# Patient Record
Sex: Female | Born: 1960 | Race: Black or African American | Hispanic: No | Smoking: Never smoker
Health system: Southern US, Community
[De-identification: ages and names within clinical notes are randomized; demographics above are authoritative.]

## PROBLEM LIST (undated history)

## (undated) DIAGNOSIS — I10 Essential (primary) hypertension: Secondary | ICD-10-CM

## (undated) DIAGNOSIS — D649 Anemia, unspecified: Secondary | ICD-10-CM

## (undated) DIAGNOSIS — K635 Polyp of colon: Secondary | ICD-10-CM

## (undated) DIAGNOSIS — G4730 Sleep apnea, unspecified: Secondary | ICD-10-CM

## (undated) DIAGNOSIS — IMO0002 Reserved for concepts with insufficient information to code with codable children: Secondary | ICD-10-CM

## (undated) DIAGNOSIS — A048 Other specified bacterial intestinal infections: Secondary | ICD-10-CM

## (undated) DIAGNOSIS — E66813 Obesity, class 3: Secondary | ICD-10-CM

## (undated) DIAGNOSIS — D509 Iron deficiency anemia, unspecified: Secondary | ICD-10-CM

## (undated) DIAGNOSIS — E559 Vitamin D deficiency, unspecified: Secondary | ICD-10-CM

## (undated) DIAGNOSIS — K219 Gastro-esophageal reflux disease without esophagitis: Secondary | ICD-10-CM

## (undated) DIAGNOSIS — F32A Depression, unspecified: Secondary | ICD-10-CM

## (undated) DIAGNOSIS — B009 Herpesviral infection, unspecified: Secondary | ICD-10-CM

## (undated) DIAGNOSIS — Z9989 Dependence on other enabling machines and devices: Secondary | ICD-10-CM

## (undated) DIAGNOSIS — K644 Residual hemorrhoidal skin tags: Secondary | ICD-10-CM

## (undated) DIAGNOSIS — M069 Rheumatoid arthritis, unspecified: Secondary | ICD-10-CM

## (undated) DIAGNOSIS — M1990 Unspecified osteoarthritis, unspecified site: Secondary | ICD-10-CM

## (undated) DIAGNOSIS — G4733 Obstructive sleep apnea (adult) (pediatric): Secondary | ICD-10-CM

## (undated) DIAGNOSIS — F329 Major depressive disorder, single episode, unspecified: Secondary | ICD-10-CM

## (undated) DIAGNOSIS — E785 Hyperlipidemia, unspecified: Secondary | ICD-10-CM

## (undated) DIAGNOSIS — T7840XA Allergy, unspecified, initial encounter: Secondary | ICD-10-CM

## (undated) DIAGNOSIS — K449 Diaphragmatic hernia without obstruction or gangrene: Secondary | ICD-10-CM

## (undated) DIAGNOSIS — K649 Unspecified hemorrhoids: Secondary | ICD-10-CM

## (undated) HISTORY — DX: Obstructive sleep apnea (adult) (pediatric): G47.33

## (undated) HISTORY — DX: Diaphragmatic hernia without obstruction or gangrene: K44.9

## (undated) HISTORY — DX: Reserved for concepts with insufficient information to code with codable children: IMO0002

## (undated) HISTORY — DX: Anemia, unspecified: D64.9

## (undated) HISTORY — DX: Iron deficiency anemia, unspecified: D50.9

## (undated) HISTORY — DX: Sleep apnea, unspecified: G47.30

## (undated) HISTORY — DX: Rheumatoid arthritis, unspecified: M06.9

## (undated) HISTORY — DX: Unspecified osteoarthritis, unspecified site: M19.90

## (undated) HISTORY — DX: Major depressive disorder, single episode, unspecified: F32.9

## (undated) HISTORY — DX: Unspecified hemorrhoids: K64.9

## (undated) HISTORY — DX: Morbid (severe) obesity due to excess calories: E66.01

## (undated) HISTORY — DX: Depression, unspecified: F32.A

## (undated) HISTORY — DX: Essential (primary) hypertension: I10

## (undated) HISTORY — DX: Vitamin D deficiency, unspecified: E55.9

## (undated) HISTORY — DX: Dependence on other enabling machines and devices: Z99.89

## (undated) HISTORY — DX: Herpesviral infection, unspecified: B00.9

## (undated) HISTORY — DX: Residual hemorrhoidal skin tags: K64.4

## (undated) HISTORY — DX: Polyp of colon: K63.5

## (undated) HISTORY — DX: Hyperlipidemia, unspecified: E78.5

## (undated) HISTORY — DX: Gastro-esophageal reflux disease without esophagitis: K21.9

## (undated) HISTORY — DX: Allergy, unspecified, initial encounter: T78.40XA

## (undated) HISTORY — DX: Other specified bacterial intestinal infections: A04.8

## (undated) HISTORY — DX: Obesity, class 3: E66.813

## (undated) HISTORY — PX: TUBAL LIGATION: SHX77

---

## 1999-08-26 ENCOUNTER — Other Ambulatory Visit: Admission: RE | Admit: 1999-08-26 | Discharge: 1999-08-26 | Payer: Self-pay | Admitting: Family Medicine

## 2003-05-12 ENCOUNTER — Emergency Department (HOSPITAL_COMMUNITY): Admission: EM | Admit: 2003-05-12 | Discharge: 2003-05-12 | Payer: Self-pay | Admitting: Emergency Medicine

## 2003-06-15 ENCOUNTER — Encounter: Admission: RE | Admit: 2003-06-15 | Discharge: 2003-06-15 | Payer: Self-pay | Admitting: Internal Medicine

## 2003-09-04 ENCOUNTER — Emergency Department (HOSPITAL_COMMUNITY): Admission: EM | Admit: 2003-09-04 | Discharge: 2003-09-04 | Payer: Self-pay | Admitting: Family Medicine

## 2004-07-11 ENCOUNTER — Emergency Department (HOSPITAL_COMMUNITY): Admission: EM | Admit: 2004-07-11 | Discharge: 2004-07-11 | Payer: Self-pay | Admitting: Family Medicine

## 2004-07-14 ENCOUNTER — Emergency Department (HOSPITAL_COMMUNITY): Admission: EM | Admit: 2004-07-14 | Discharge: 2004-07-14 | Payer: Self-pay | Admitting: Family Medicine

## 2004-07-18 ENCOUNTER — Emergency Department (HOSPITAL_COMMUNITY): Admission: EM | Admit: 2004-07-18 | Discharge: 2004-07-18 | Payer: Self-pay | Admitting: Family Medicine

## 2005-12-05 ENCOUNTER — Emergency Department (HOSPITAL_COMMUNITY): Admission: EM | Admit: 2005-12-05 | Discharge: 2005-12-06 | Payer: Self-pay | Admitting: Family Medicine

## 2006-11-14 ENCOUNTER — Ambulatory Visit (HOSPITAL_COMMUNITY): Admission: RE | Admit: 2006-11-14 | Discharge: 2006-11-14 | Payer: Self-pay | Admitting: Internal Medicine

## 2011-05-25 ENCOUNTER — Ambulatory Visit (INDEPENDENT_AMBULATORY_CARE_PROVIDER_SITE_OTHER): Payer: BC Managed Care – PPO | Admitting: Physician Assistant

## 2011-05-25 DIAGNOSIS — N920 Excessive and frequent menstruation with regular cycle: Secondary | ICD-10-CM

## 2011-05-25 DIAGNOSIS — Z23 Encounter for immunization: Secondary | ICD-10-CM

## 2011-05-25 DIAGNOSIS — E782 Mixed hyperlipidemia: Secondary | ICD-10-CM

## 2011-05-25 DIAGNOSIS — D509 Iron deficiency anemia, unspecified: Secondary | ICD-10-CM

## 2011-07-05 ENCOUNTER — Encounter: Payer: Self-pay | Admitting: Physician Assistant

## 2011-07-05 DIAGNOSIS — Z6836 Body mass index (BMI) 36.0-36.9, adult: Secondary | ICD-10-CM | POA: Insufficient documentation

## 2011-07-05 DIAGNOSIS — I10 Essential (primary) hypertension: Secondary | ICD-10-CM | POA: Insufficient documentation

## 2011-07-05 DIAGNOSIS — G4733 Obstructive sleep apnea (adult) (pediatric): Secondary | ICD-10-CM | POA: Insufficient documentation

## 2011-07-05 DIAGNOSIS — K219 Gastro-esophageal reflux disease without esophagitis: Secondary | ICD-10-CM | POA: Insufficient documentation

## 2011-07-05 DIAGNOSIS — B009 Herpesviral infection, unspecified: Secondary | ICD-10-CM | POA: Insufficient documentation

## 2011-07-05 DIAGNOSIS — E559 Vitamin D deficiency, unspecified: Secondary | ICD-10-CM | POA: Insufficient documentation

## 2011-07-05 DIAGNOSIS — D509 Iron deficiency anemia, unspecified: Secondary | ICD-10-CM | POA: Insufficient documentation

## 2011-07-05 DIAGNOSIS — E1159 Type 2 diabetes mellitus with other circulatory complications: Secondary | ICD-10-CM | POA: Insufficient documentation

## 2011-07-21 ENCOUNTER — Other Ambulatory Visit: Payer: Self-pay | Admitting: Physician Assistant

## 2011-09-15 ENCOUNTER — Ambulatory Visit: Payer: BC Managed Care – PPO

## 2011-09-18 ENCOUNTER — Ambulatory Visit (INDEPENDENT_AMBULATORY_CARE_PROVIDER_SITE_OTHER): Payer: BC Managed Care – PPO | Admitting: Family Medicine

## 2011-09-18 VITALS — BP 145/82 | HR 60 | Temp 98.3°F | Resp 18 | Ht 66.5 in | Wt 268.0 lb

## 2011-09-18 DIAGNOSIS — J40 Bronchitis, not specified as acute or chronic: Secondary | ICD-10-CM

## 2011-09-18 DIAGNOSIS — J069 Acute upper respiratory infection, unspecified: Secondary | ICD-10-CM

## 2011-09-18 MED ORDER — HYDROCODONE-HOMATROPINE 5-1.5 MG/5ML PO SYRP: 5.0000 mL | ORAL_SOLUTION | Freq: Three times a day (TID) | ORAL | Status: AC | PRN

## 2011-09-18 MED ORDER — AZITHROMYCIN 250 MG PO TABS: ORAL_TABLET | ORAL | Status: AC

## 2011-09-18 NOTE — Patient Instructions (Signed)

## 2011-09-18 NOTE — Progress Notes (Signed)
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 She's had one week of progressive cough occasionally productive and associated with intermittent low-grade fever. She's had no shortness of breath, but she has noted sided rib pain with cough. He's had mild sinus congestion, no ear pain or sore throat  Objective: Middle-age woman overweight, in no acute distress  HEENT: Unremarkable  Chest: A few rhonchi  Heart: Regular no murmur or rub extremities: No edema  Assessment: URI, bronchitis  Plan: Z-Pak and Hydromet

## 2011-10-24 ENCOUNTER — Ambulatory Visit (INDEPENDENT_AMBULATORY_CARE_PROVIDER_SITE_OTHER): Payer: BC Managed Care – PPO | Admitting: Physician Assistant

## 2011-10-24 VITALS — BP 142/78 | HR 64 | Temp 98.2°F | Resp 20 | Ht 65.5 in | Wt 259.2 lb

## 2011-10-24 DIAGNOSIS — D649 Anemia, unspecified: Secondary | ICD-10-CM

## 2011-10-24 DIAGNOSIS — R5383 Other fatigue: Secondary | ICD-10-CM

## 2011-10-24 DIAGNOSIS — L83 Acanthosis nigricans: Secondary | ICD-10-CM

## 2011-10-24 DIAGNOSIS — F329 Major depressive disorder, single episode, unspecified: Secondary | ICD-10-CM

## 2011-10-24 DIAGNOSIS — R5381 Other malaise: Secondary | ICD-10-CM

## 2011-10-24 LAB — COMPREHENSIVE METABOLIC PANEL
ALT: 15 U/L (ref 0–35)
AST: 17 U/L (ref 0–37)
Albumin: 4.1 g/dL (ref 3.5–5.2)
Alkaline Phosphatase: 64 U/L (ref 39–117)
BUN: 11 mg/dL (ref 6–23)
CO2: 27 mEq/L (ref 19–32)
Calcium: 9.2 mg/dL (ref 8.4–10.5)
Chloride: 104 mEq/L (ref 96–112)
Creat: 0.67 mg/dL (ref 0.50–1.10)
Glucose, Bld: 76 mg/dL (ref 70–99)
Potassium: 4.7 mEq/L (ref 3.5–5.3)
Sodium: 139 mEq/L (ref 135–145)
Total Bilirubin: 0.4 mg/dL (ref 0.3–1.2)
Total Protein: 7.9 g/dL (ref 6.0–8.3)

## 2011-10-24 LAB — POCT CBC
Granulocyte percent: 52 %G (ref 37–80)
HCT, POC: 36.5 % — AB (ref 37.7–47.9)
Hemoglobin: 11 g/dL — AB (ref 12.2–16.2)
Lymph, poc: 1.9 (ref 0.6–3.4)
MCH, POC: 24.2 pg — AB (ref 27–31.2)
MCHC: 30.1 g/dL — AB (ref 31.8–35.4)
MCV: 80.5 fL (ref 80–97)
MID (cbc): 0.3 (ref 0–0.9)
MPV: 7.5 fL (ref 0–99.8)
POC Granulocyte: 2.3 (ref 2–6.9)
POC LYMPH PERCENT: 42 %L (ref 10–50)
POC MID %: 6 %M (ref 0–12)
Platelet Count, POC: 488 10*3/uL — AB (ref 142–424)
RBC: 4.54 M/uL (ref 4.04–5.48)
RDW, POC: 15.4 %
WBC: 4.5 10*3/uL — AB (ref 4.6–10.2)

## 2011-10-24 LAB — TSH: TSH: 1.555 u[IU]/mL (ref 0.350–4.500)

## 2011-10-24 LAB — POCT CBG (FASTING - GLUCOSE)-MANUAL ENTRY: Glucose Fasting, POC: 86 mg/dL (ref 70–99)

## 2011-10-24 LAB — POCT GLYCOSYLATED HEMOGLOBIN (HGB A1C): Hemoglobin A1C: 5.2

## 2011-10-24 MED ORDER — BUPROPION HCL ER (XL) 150 MG PO TB24: 150.0000 mg | ORAL_TABLET | Freq: Every day | ORAL | Status: DC

## 2011-10-24 NOTE — Progress Notes (Signed)
Subjective:    Patient ID: Laurie Hodge, female    DOB: July 30, 1960, 51 y.o.   MRN: [id number]  HPI [name] is here for f/u of her chronic IDA. She states that she has been experienceing excessive fatigue over the past 3-6 mos. She has gotten behind on household chores. Sh has to make herself get up to go to work and then immediately lies down when she gets home.  She has some trouble sleeping and also has loss of interest in doing things with her friend.   Initially,stated that was not sure if she is depressed or if she is just too tired to care.  On further questioning she admits to [facility] without plan. She also admits to feeling empty and sad. She does have a pica for dirt but does not eat it and does not eat cooking or laundry starch. She continues to have very heavy periods associated with her history of fibroids.  Occasional palpitations and light headedness 1-2/month, no syncope or presyncope. She denies cold intolerance or weight gain. She has lost about 9 pounds and is doing a weight loss program.  Patient also notes that she has sleep apnea and feels that her mask might need adjustment .    Review of Systems    As stated in HPI Objective:   Physical Exam  Constitutional: She appears well-developed and well-nourished.       Patient is pleasant and interactive.  HENT:  Head: Normocephalic and atraumatic.  Eyes:       No conjunctival pallor   Neck: Normal range of motion. Neck supple. Thyromegaly (questionable thyromegaly) present.       Dark, velvety skin lining folds on posterior neck  Cardiovascular: Normal rate, regular rhythm and normal heart sounds.   Pulmonary/Chest: Effort normal and breath sounds normal. No respiratory distress.  Musculoskeletal: She exhibits no edema.  Skin: No pallor.   Filed Vitals:   10/24/11 1033  BP: 142/78  Pulse: 64  Temp: 98.2 F (36.8 C)  Resp: 20   Results for orders placed in visit on 10/24/11  POCT CBG (FASTING - GLUCOSE)-MANUAL ENTRY      Component Value Range   Glucose Fasting, POC 86  70 - 99 (mg/dL)  POCT CBC      Component Value Range   WBC 4.5 (*) 4.6 - 10.2 (K/uL)   Lymph, poc 1.9  0.6 - 3.4    POC LYMPH PERCENT 42.0  10 - 50 (%L)   MID (cbc) 0.3  0 - 0.9    POC MID % 6.0  0 - 12 (%M)   POC Granulocyte 2.3  2 - 6.9    Granulocyte percent 52.0  37 - 80 (%G)   RBC 4.54  4.04 - 5.48 (M/uL)   Hemoglobin 11.0 (*) 12.2 - 16.2 (g/dL)   HCT, POC 69.6 (*) 29.5 - 47.9 (%)   MCV 80.5  80 - 97 (fL)   MCH, POC 24.2 (*) 27 - 31.2 (pg)   MCHC 30.1 (*) 31.8 - 35.4 (g/dL)   RDW, POC 28.4     Platelet Count, POC 488 (*) 142 - 424 (K/uL)   MPV 7.5  0 - 99.8 (fL)  POCT GLYCOSYLATED HEMOGLOBIN (HGB A1C)      Component Value Range   Hemoglobin A1C 5.2            Assessment & Plan:   1. Fatigue  TSH, Glucose (CBG), Fasting, Comprehensive metabolic panel, POCT CBC, POCT glycosylated hemoglobin (Hb  A1C)  2. Anemia  POCT CBC  3. Acanthosis nigricans  Glucose (CBG), Fasting, POCT glycosylated hemoglobin (Hb A1C)  4. Depression  buPROPion (WELLBUTRIN XL) 150 MG 24 hr tablet   Patient is going to get counseling through her work.  She is also going try and find someone to start walking with. RTC in 4 weeks.

## 2011-10-24 NOTE — Patient Instructions (Signed)
Please follow up in 4 weeks or id needed.  Depression, Adolescent and Adult Depression is a true and treatable medical condition. In general there are two kinds of depression:  Depression we all experience in some form. For example depression from the death of a loved one, financial distress or natural disasters will trigger or increase depression.   Clinical depression, on the other hand, appears without an apparent cause or reason. This depression is a disease. Depression may be caused by chemical imbalance in the body and brain or may come as a response to a physical illness. Alcohol and other drugs can cause depression.  DIAGNOSIS  The diagnosis of depression is usually based upon symptoms and medical history. TREATMENT  Treatments for depression fall into three categories. These are:  Drug therapy. There are many medicines that treat depression. Responses may vary and sometimes trial and error is necessary to determine the best medicines and dosage for a particular patient.   Psychotherapy, also called talking treatments, helps people resolve their problems by looking at them from a different point of view and by giving people insight into their own personal makeup. Traditional psychotherapy looks at a childhood source of a problem. Other psychotherapy will look at current conflicts and move toward solving those. If the cause of depression is drug use, counseling is available to help abstain. In time the depression will usually improve. If there were underlying causes for the chemical use, they can be addressed.   ECT (electroconvulsive therapy) or shock treatment is not as commonly used today. It is a very effective treatment for severe suicidal depression. During ECT electrical impulses are applied to the head. These impulses cause a generalized seizure. It can be effective but causes a loss of memory for recent events. Sometimes this loss of memory may include the last several months.  Treat  all depression or suicide threats as serious. Obtain professional help. Do not wait to see if serious depression will get better over time without help. Seek help for yourself or those around you. In the [place] the number to the National Suicide Help Lines With 24 Hour Help Are: 1-800-SUICIDE 858-022-1458 Document Released: 06/02/2000 Document Revised: 05/25/2011 Document Reviewed: 01/22/2008 [facility] Patient Information 2012 [place], [place].

## 2011-10-24 NOTE — Progress Notes (Signed)
I have examined this patient along with the student and agree.  

## 2011-11-16 ENCOUNTER — Ambulatory Visit (INDEPENDENT_AMBULATORY_CARE_PROVIDER_SITE_OTHER): Payer: BC Managed Care – PPO | Admitting: Physician Assistant

## 2011-11-16 VITALS — BP 141/81 | HR 62 | Temp 98.3°F | Resp 16 | Ht 65.0 in | Wt 254.0 lb

## 2011-11-16 DIAGNOSIS — F329 Major depressive disorder, single episode, unspecified: Secondary | ICD-10-CM

## 2011-11-16 DIAGNOSIS — I10 Essential (primary) hypertension: Secondary | ICD-10-CM

## 2011-11-16 DIAGNOSIS — D649 Anemia, unspecified: Secondary | ICD-10-CM

## 2011-11-16 MED ORDER — BUPROPION HCL ER (XL) 300 MG PO TB24: 300.0000 mg | ORAL_TABLET | Freq: Every day | ORAL | Status: DC

## 2011-11-16 MED ORDER — POLYSACCHARIDE IRON COMPLEX 150 MG PO CAPS: 150.0000 mg | ORAL_CAPSULE | Freq: Two times a day (BID) | ORAL | Status: DC

## 2011-11-16 MED ORDER — LISINOPRIL 20 MG PO TABS: 20.0000 mg | ORAL_TABLET | Freq: Every day | ORAL | Status: DC

## 2011-11-16 NOTE — Progress Notes (Signed)
  Subjective:    Patient ID: Laurie Hodge, female    DOB: 1961/02/06, 51 y.o.   MRN: [id number]  HPI Presents for follow-up of depression.  Feels DRAMATICALLY better on the Wellbutrin.  She's exercising, doing her hobbies and talking will friends, all of which she had stopped as her mood worsened.  Feels like she could still improve.  Is wondering if the myalgias she thought were from the pravastatin were actually from the depression.  "I feel physically better."  Review of Systems As above.    Objective:   Physical Exam Vital signs noted. Well-developed, well nourished BF who is awake, alert and oriented, in NAD. HEENT: [place]/AT, PERRL, EOMI.  Sclera and conjunctiva are clear.  EAC are patent, TMs are normal in appearance. Nasal mucosa is pink and moist. OP is clear. Neck: supple, non-tender, no lymphadenopathy, thyromegaly. Heart: RRR, no murmur Lungs: CTA Abdomen: normo-active bowel sounds, supple, non-tender, no mass or organomegaly. Extremities: no cyanosis, clubbing or edema. Skin: warm and dry without rash.     Assessment & Plan:   1. HTN (hypertension)  lisinopril (PRINIVIL,ZESTRIL) 20 MG tablet  2. Anemia  iron polysaccharides (FERREX 150) 150 MG capsule  3. Depression  buPROPion (WELLBUTRIN XL) 300 MG 24 hr tablet   Re-check and repeat CBC in 2 months, sooner if needed.

## 2011-12-26 LAB — HM MAMMOGRAPHY: HM Mammogram: NEGATIVE

## 2011-12-30 ENCOUNTER — Encounter: Payer: Self-pay | Admitting: Physician Assistant

## 2012-01-16 ENCOUNTER — Encounter: Payer: Self-pay | Admitting: Physician Assistant

## 2012-01-16 ENCOUNTER — Ambulatory Visit (INDEPENDENT_AMBULATORY_CARE_PROVIDER_SITE_OTHER): Payer: BC Managed Care – PPO | Admitting: Physician Assistant

## 2012-01-16 VITALS — BP 118/76 | HR 76 | Temp 99.1°F | Resp 16 | Ht 65.5 in | Wt 241.4 lb

## 2012-01-16 DIAGNOSIS — E785 Hyperlipidemia, unspecified: Secondary | ICD-10-CM

## 2012-01-16 DIAGNOSIS — I10 Essential (primary) hypertension: Secondary | ICD-10-CM

## 2012-01-16 LAB — COMPREHENSIVE METABOLIC PANEL
ALT: 17 U/L (ref 0–35)
AST: 22 U/L (ref 0–37)
Albumin: 4 g/dL (ref 3.5–5.2)
Alkaline Phosphatase: 69 U/L (ref 39–117)
BUN: 10 mg/dL (ref 6–23)
CO2: 24 mEq/L (ref 19–32)
Calcium: 8.7 mg/dL (ref 8.4–10.5)
Chloride: 108 mEq/L (ref 96–112)
Creat: 0.72 mg/dL (ref 0.50–1.10)
Glucose, Bld: 82 mg/dL (ref 70–99)
Potassium: 4.4 mEq/L (ref 3.5–5.3)
Sodium: 140 mEq/L (ref 135–145)
Total Bilirubin: 0.4 mg/dL (ref 0.3–1.2)
Total Protein: 6.8 g/dL (ref 6.0–8.3)

## 2012-01-16 LAB — LIPID PANEL
Cholesterol: 207 mg/dL — ABNORMAL HIGH (ref 0–200)
HDL: 43 mg/dL (ref >39)
LDL Cholesterol: 139 mg/dL — ABNORMAL HIGH (ref 0–99)
Total CHOL/HDL Ratio: 4.8 Ratio
Triglycerides: 125 mg/dL (ref <150)
VLDL: 25 mg/dL (ref 0–40)

## 2012-01-16 MED ORDER — PRAVASTATIN SODIUM 20 MG PO TABS: 20.0000 mg | ORAL_TABLET | Freq: Every evening | ORAL | Status: DC

## 2012-01-16 NOTE — Progress Notes (Signed)
Subjective:    Patient ID: Laurie Hodge, female    DOB: [birth year], 51 y.o.   MRN: [id number]  HPI This 51 y.o. Female presents for re-check of cholesterol.  D/C'd Pravachol due to myalgias, though unclear if the myalgias were actually from the drug.  She's wanting to re-try it to see if the aches were really caused by the pravastatin.  She has been working hard on healthier eating and regular exercise and has had some weight loss.  Review of Systems No chest pain, SOB, HA, dizziness, vision change, N/V, diarrhea, constipation, dysuria, urinary urgency or frequency, myalgias, arthralgias or rash.   Past Medical History  Diagnosis Date  . Obesity, Class III, BMI 40-49.9 (morbid obesity)   . GERD (gastroesophageal reflux disease)   . OSA on CPAP   . HTN (hypertension)   . HSV-2 infection     IgG  . Vitamin D insufficiency   . Hemorrhoid   . Iron (Fe) deficiency anemia   . Hyperlipidemia     Past Surgical History  Procedure Date  . Tubal ligation     Prior to Admission medications   Medication Sig Start Date End Date Taking? Authorizing Provider  buPROPion (WELLBUTRIN XL) 300 MG 24 hr tablet Take 1 tablet (300 mg total) by mouth daily. 11/16/11 11/15/12 Yes Hermenegildo Clausen S Siriyah Ambrosius, PA-C  cetirizine (ZYRTEC) 10 MG tablet Take 10 mg by mouth daily.   Yes Historical Provider, MD  Cholecalciferol (VITAMIN D PO) Take by mouth daily.   Yes Historical Provider, MD  iron polysaccharides (FERREX 150) 150 MG capsule Take 1 capsule (150 mg total) by mouth 2 (two) times daily. 11/16/11  Yes Farron Watrous S Kastiel Simonian, PA-C  lisinopril (PRINIVIL,ZESTRIL) 20 MG tablet Take 1 tablet (20 mg total) by mouth daily. 11/16/11  Yes Loralye Loberg S Kaliah Haddaway, PA-C  Omega-3 Fatty Acids (FISH OIL) 1200 MG CAPS Take 2 capsules by mouth daily.   Yes Historical Provider, MD  pravastatin (PRAVACHOL) 20 MG tablet Take 1 tablet (20 mg total) by mouth every evening. 01/16/12   Quantay Zaremba Tessa Lerner, PA-C    Allergies  Allergen Reactions    . Adhesive (Tape) Rash    Skin Peels    History   Social History  . Marital Status: Divorced    Spouse Name: N/A    Number of Children: 3  . Years of Education: 12   Occupational History  . TEACHER'S ASSISTANT [employer]   Social History Main Topics  . Smoking status: Never Smoker   . Smokeless tobacco: Never Used  . Alcohol Use: Yes     occasionally  . Drug Use: No  . Sexually Active: Not Currently -- Female partner(s)     not active since divorce from husband 2010   Other Topics Concern  . Not on file   Social History Narrative  . No narrative on file    Family History  Problem Relation Age of Onset  . Diabetes Mother   . Hypertension Mother   . Cancer Paternal Aunt     breast ca x 5 maternal aunts  . Diabetes Sister        Objective:   Physical Exam  Blood pressure 118/76, pulse 76, temperature 99.1 F (37.3 C), temperature source Oral, resp. rate 16, height 5' 5.5" (1.664 m), weight 241 lb 6.4 oz (109.498 kg), last menstrual period 01/12/2012, SpO2 100.00%. Body mass index is 39.56 kg/(m^2). Well-developed, well nourished BF who is awake, alert and oriented, in NAD. Has lost  13 pounds since 11/16/2011. HEENT: [place]/AT, PERRL, EOMI.  Sclera and conjunctiva are clear.  EAC are patent, TMs are normal in appearance. Nasal mucosa is pink and moist. OP is clear. Neck: supple, non-tender, no lymphadenopathy, thyromegaly. Heart: RRR, no murmur Lungs: CTA Abdomen: normo-active bowel sounds, supple, non-tender, no mass or organomegaly. Extremities: no cyanosis, clubbing or edema. Skin: warm and dry without rash.      Assessment & Plan:   1. HTN (hypertension)  Comprehensive metabolic panel  2. Hyperlipidemia LDL goal < 100  Comprehensive metabolic panel, Lipid panel, pravastatin (PRAVACHOL) 20 MG tablet   She'll restart the pravastatin and see if the myalgias recur.  If they do, she'll D/C it again and call me.  I would try Niaspan or Welchol  next.  RTC 3 months.

## 2012-01-16 NOTE — Patient Instructions (Signed)
Keep up the GREAT work! 

## 2012-01-17 ENCOUNTER — Encounter: Payer: Self-pay | Admitting: Physician Assistant

## 2012-04-01 ENCOUNTER — Ambulatory Visit (INDEPENDENT_AMBULATORY_CARE_PROVIDER_SITE_OTHER): Payer: BC Managed Care – PPO | Admitting: Physician Assistant

## 2012-04-01 VITALS — BP 171/102 | HR 80 | Temp 98.9°F | Resp 16 | Ht 65.5 in | Wt 255.0 lb

## 2012-04-01 DIAGNOSIS — L282 Other prurigo: Secondary | ICD-10-CM

## 2012-04-01 DIAGNOSIS — Z2089 Contact with and (suspected) exposure to other communicable diseases: Secondary | ICD-10-CM

## 2012-04-01 DIAGNOSIS — Z207 Contact with and (suspected) exposure to pediculosis, acariasis and other infestations: Secondary | ICD-10-CM

## 2012-04-01 MED ORDER — PERMETHRIN 5 % EX CREA: TOPICAL_CREAM | Freq: Once | CUTANEOUS | Status: DC

## 2012-04-01 NOTE — Patient Instructions (Addendum)
Take Zyrtec daily in the morning Benadryl 25-50 mg at bedtime.

## 2012-04-02 NOTE — Progress Notes (Signed)
  Subjective:    Patient ID: Laurie Hodge, female    DOB: 25-Nov-1960, 51 y.o.   MRN: [id number]  HPI 51 year old female presents with 2 week history of pruritic rash. 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  She is concerned this is what she has developed.  Admits to extremely pruritic rash on both her legs, especially her popliteal fossa's.  This has been worsening over the past 2 weeks. She has not tried any medications or creams yet.     Review of Systems  Constitutional: Negative for fever and chills.  Skin: Positive for rash.  All other systems reviewed and are negative.       Objective:   Physical Exam  Constitutional: She is oriented to person, place, and time. She appears well-developed and well-nourished.  HENT:  Head: Normocephalic and atraumatic.  Right Ear: External ear normal.  Left Ear: External ear normal.  Eyes: Conjunctivae normal are normal.  Cardiovascular: Normal rate.   Pulmonary/Chest: Effort normal.  Neurological: She is alert and oriented to person, place, and time.  Skin:       Bilateral legs and popliteal fossa have papular, scabbed lesions with linear burrowing  Psychiatric: She has a normal mood and affect. Her behavior is normal. Judgment and thought content normal.          Assessment & Plan:   1. Pruritic rash   2. Scabies exposure   Permethrin 5% cream apply from neck down, leave on for 8 hours, then wash off May repeat in 7 days if symptoms persist Wash all clothes Zyrtec daily in the morning, Benadryl at bedtime Follow up if symptoms worsen or fail to improve

## 2012-04-18 ENCOUNTER — Ambulatory Visit: Payer: BC Managed Care – PPO | Admitting: Physician Assistant

## 2012-04-25 ENCOUNTER — Ambulatory Visit: Payer: BC Managed Care – PPO | Admitting: Physician Assistant

## 2012-05-23 ENCOUNTER — Ambulatory Visit (INDEPENDENT_AMBULATORY_CARE_PROVIDER_SITE_OTHER): Payer: BC Managed Care – PPO | Admitting: Physician Assistant

## 2012-05-23 ENCOUNTER — Encounter: Payer: Self-pay | Admitting: Physician Assistant

## 2012-05-23 VITALS — BP 148/82 | HR 72 | Temp 98.2°F | Resp 16 | Ht 65.5 in | Wt 262.0 lb

## 2012-05-23 DIAGNOSIS — K219 Gastro-esophageal reflux disease without esophagitis: Secondary | ICD-10-CM

## 2012-05-23 DIAGNOSIS — E785 Hyperlipidemia, unspecified: Secondary | ICD-10-CM

## 2012-05-23 DIAGNOSIS — Z23 Encounter for immunization: Secondary | ICD-10-CM

## 2012-05-23 DIAGNOSIS — J309 Allergic rhinitis, unspecified: Secondary | ICD-10-CM

## 2012-05-23 DIAGNOSIS — I10 Essential (primary) hypertension: Secondary | ICD-10-CM

## 2012-05-23 DIAGNOSIS — D649 Anemia, unspecified: Secondary | ICD-10-CM

## 2012-05-23 MED ORDER — FLUTICASONE PROPIONATE 50 MCG/ACT NA SUSP: 2.0000 | Freq: Every day | NASAL | Status: DC

## 2012-05-23 MED ORDER — LISINOPRIL 20 MG PO TABS: 20.0000 mg | ORAL_TABLET | Freq: Two times a day (BID) | ORAL | Status: DC

## 2012-05-23 MED ORDER — POLYSACCHARIDE IRON COMPLEX 150 MG PO CAPS: 150.0000 mg | ORAL_CAPSULE | Freq: Two times a day (BID) | ORAL | Status: DC

## 2012-05-23 NOTE — Progress Notes (Signed)
Subjective:    Patient ID: Laurie Hodge, female    DOB: 1961/06/02, 51 y.o.   MRN: [id number]  HPI This 51 y.o. female presents for evaluation of HTN, obesity, GERD.   Change in diet and activity since moving in to her mother's house in September to care for her.  BP at home ranges 160's/80's. Isn't getting regular exercise and isn't making healthy eating choices.  It's very exhausting caring for her mother (she took over from her sister, and will be relieved by another sibling in February).  She really misses her routine and her own home.  Past Medical History  Diagnosis Date  . Obesity, Class III, BMI 40-49.9 (morbid obesity)   . GERD (gastroesophageal reflux disease)   . OSA on CPAP   . HTN (hypertension)   . HSV-2 infection     IgG  . Vitamin D insufficiency   . Hemorrhoid   . Iron (Fe) deficiency anemia   . Hyperlipidemia     Past Surgical History  Procedure Date  . Tubal ligation     Prior to Admission medications   Medication Sig Start Date End Date Taking? Authorizing Provider  buPROPion (WELLBUTRIN XL) 300 MG 24 hr tablet Take 1 tablet (300 mg total) by mouth daily. 11/16/11 11/15/12 Yes Abed Schar S Shamiracle Gorden, PA-C  cetirizine (ZYRTEC) 10 MG tablet Take 10 mg by mouth daily.   Yes Historical Provider, MD  Cholecalciferol (VITAMIN D PO) Take by mouth daily.   Yes Historical Provider, MD  iron polysaccharides (FERREX 150) 150 MG capsule Take 1 capsule (150 mg total) by mouth 2 (two) times daily. 11/16/11  Yes Marlen Koman S Jylian Pappalardo, PA-C  lisinopril (PRINIVIL,ZESTRIL) 20 MG tablet Take 1 tablet (20 mg total) by mouth daily. 11/16/11  Yes Labrisha Wuellner Tessa Lerner, PA-C  Multiple Vitamin CAPS Take by mouth daily.   Yes Historical Provider, MD  Omega-3 Fatty Acids (FISH OIL) 1200 MG CAPS Take 2 capsules by mouth daily.   Yes Historical Provider, MD    Allergies  Allergen Reactions  . Adhesive (Tape) Rash    Skin Peels    History   Social History  . Marital Status: Divorced    Spouse  Name: N/A    Number of Children: 3  . Years of Education: 12   Occupational History  . 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  [employer]   Social History Main Topics  . Smoking status: Never Smoker   . Smokeless tobacco: Never Used  . Alcohol Use: Yes     Comment: occasionally  . Drug Use: No  . Sexually Active: Not Currently -- Female partner(s)     Comment: not active since divorce from husband 2010   Other Topics Concern  . Not on file   Social History Narrative   Living with mother, who has developed dementia.  She and her siblings are taking turns staying with her.  She'll move back home in 07/2012.    Family History  Problem Relation Age of Onset  . Diabetes Mother   . Hypertension Mother   . Dementia Mother   . Cancer Paternal Aunt     breast ca x 5 maternal aunts  . Diabetes Sister     Review of Systems Feels crummy lately.  No chest pain, SOB, HA, dizziness, vision change, N/V, diarrhea, constipation, dysuria, urinary urgency or frequency, myalgias, arthralgias or rash. Some increased sinus pressure and drainage. Has used a nasal spray in the past, but not recently.  Objective:   Physical Exam  Blood pressure 148/82, pulse 72, temperature 98.2 F (36.8 C), temperature source Oral, resp. rate 16, height 5' 5.5" (1.664 m), weight 262 lb (118.842 kg), last menstrual period 05/16/2012, SpO2 100.00%. Body mass index is 42.94 kg/(m^2). Well-developed, well nourished BF who is awake, alert and oriented, in NAD. HEENT: [place]/AT, PERRL, EOMI.  Sclera and conjunctiva are clear.  EAC are patent, TMs are normal in appearance. Nasal mucosa is pale pink and moist, but congested. OP is clear. Neck: supple, non-tender, no lymphadenopathy, thyromegaly. Heart: RRR, no murmur Lungs: normal effort, CTA Extremities: no cyanosis, clubbing or edema. Skin: warm and dry without rash. Psychologic: good mood and appropriate affect, normal speech and behavior.     Assessment & Plan:   1. HTN (hypertension) -above goal today (has not taken meds today) Increase lisinopril (PRINIVIL,ZESTRIL) 20 MG tablet to BID, Comprehensive metabolic panel  2. Obesity, Class III, BMI 40-49.9 (morbid obesity)  Intentional meal planning.  Exercise.  3. GERD (gastroesophageal reflux disease) -controlled without treatment   4. Anemia  iron polysaccharides (FERREX 150) 150 MG capsule  5. Hyperlipidemia  Comprehensive metabolic panel, Lipid panel; lifestyle changes  6. AR (allergic rhinitis)  fluticasone (FLONASE) 50 MCG/ACT nasal spray  7. Need for influenza vaccination  Flu vaccine greater than or equal to 3yo preservative free IM   Discuss an alternate plan with her siblings regarding the care for their mother-give the in home caregiver a long weekend every few weeks during their stint.

## 2012-05-23 NOTE — Patient Instructions (Signed)
Don't forget to keep yourself healthy while you're with your mother-you're no good to her if you're not good to you!

## 2012-05-24 ENCOUNTER — Encounter: Payer: Self-pay | Admitting: Physician Assistant

## 2012-05-24 LAB — LIPID PANEL
Cholesterol: 209 mg/dL — ABNORMAL HIGH (ref 0–200)
HDL: 50 mg/dL (ref >39)
LDL Cholesterol: 137 mg/dL — ABNORMAL HIGH (ref 0–99)
Total CHOL/HDL Ratio: 4.2 Ratio
Triglycerides: 110 mg/dL (ref <150)
VLDL: 22 mg/dL (ref 0–40)

## 2012-05-24 LAB — COMPREHENSIVE METABOLIC PANEL
ALT: 17 U/L (ref 0–35)
AST: 19 U/L (ref 0–37)
Albumin: 4 g/dL (ref 3.5–5.2)
Alkaline Phosphatase: 72 U/L (ref 39–117)
BUN: 12 mg/dL (ref 6–23)
CO2: 27 mEq/L (ref 19–32)
Calcium: 9.3 mg/dL (ref 8.4–10.5)
Chloride: 104 mEq/L (ref 96–112)
Creat: 0.75 mg/dL (ref 0.50–1.10)
Glucose, Bld: 75 mg/dL (ref 70–99)
Potassium: 4.1 mEq/L (ref 3.5–5.3)
Sodium: 138 mEq/L (ref 135–145)
Total Bilirubin: 0.4 mg/dL (ref 0.3–1.2)
Total Protein: 7.3 g/dL (ref 6.0–8.3)

## 2012-07-09 ENCOUNTER — Other Ambulatory Visit: Payer: Self-pay | Admitting: Physician Assistant

## 2012-08-04 ENCOUNTER — Ambulatory Visit (INDEPENDENT_AMBULATORY_CARE_PROVIDER_SITE_OTHER): Payer: BC Managed Care – PPO | Admitting: Family Medicine

## 2012-08-04 VITALS — BP 138/83 | HR 76 | Temp 98.3°F | Resp 18 | Ht 66.25 in | Wt 266.0 lb

## 2012-08-04 DIAGNOSIS — J029 Acute pharyngitis, unspecified: Secondary | ICD-10-CM

## 2012-08-04 DIAGNOSIS — J020 Streptococcal pharyngitis: Secondary | ICD-10-CM

## 2012-08-04 DIAGNOSIS — R05 Cough: Secondary | ICD-10-CM

## 2012-08-04 DIAGNOSIS — I10 Essential (primary) hypertension: Secondary | ICD-10-CM

## 2012-08-04 LAB — POCT RAPID STREP A (OFFICE): Rapid Strep A Screen: NEGATIVE

## 2012-08-04 MED ORDER — AMOXICILLIN 500 MG PO CAPS: 500.0000 mg | ORAL_CAPSULE | Freq: Three times a day (TID) | ORAL | Status: DC

## 2012-08-04 MED ORDER — VALSARTAN 80 MG PO TABS: 40.0000 mg | ORAL_TABLET | Freq: Every day | ORAL | Status: DC

## 2012-08-04 NOTE — Progress Notes (Signed)
[address]   [place], [place]  [address]   [phone]  Subjective:    Patient ID: Laurie Hodge, female    DOB: 07/23/1960, 52 y.o.   MRN: [id number]  HPI This 52 y.o. female presents for evaluation of sore throat and cough.   1.  Sore throat:  Onset two months ago; intermittent issue.  Not daily.  Will last for 1-2 weeks and then resolve; then goes away for two weeks and then recurs.  +fever low grade; +chills; +sweats.  +ear pain L sided severe.  No rhinorrhea; no nasal congestion; +PND; all day sore throat.  No cough with sore throat.  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  No tobacco.  ST mostly L sided; pain with swallowing.  Does have some intermittent reflux/indigestion/heartburn.  2.  Cough:  Duration 2-3 months ago.  Dry hacking cough.   No sputum production; seems worse at night.  No SOB.  Worried about lisinopril-induced cough.  Does suffer with chronic gerd/indigestion.  Not taking anything for heartburn currently.   Review of Systems  Constitutional: Negative for fever, chills, diaphoresis and fatigue.  HENT: Positive for ear pain, sore throat, trouble swallowing and postnasal drip. Negative for congestion, rhinorrhea, voice change and sinus pressure.   Respiratory: Positive for cough. Negative for shortness of breath and wheezing.   Gastrointestinal: Negative for nausea, vomiting and diarrhea.        Past Medical History  Diagnosis Date  . Obesity, Class III, BMI 40-49.9 (morbid obesity)   . GERD (gastroesophageal reflux disease)   . OSA on CPAP   . HTN (hypertension)   . HSV-2 infection     IgG  . Vitamin D insufficiency   . Hemorrhoid   . Iron (Fe) deficiency anemia   . Hyperlipidemia     Past Surgical History  Procedure Laterality Date  . Tubal ligation      Prior to Admission medications   Medication Sig Start Date End Date Taking? Authorizing Provider  buPROPion (WELLBUTRIN XL) 300 MG 24 hr tablet TAKE ONE TABLET BY MOUTH EVERY  DAY 07/09/12  Yes Marzella Schlein McClung, PA-C  cetirizine (ZYRTEC) 10 MG tablet Take 10 mg by mouth daily.   Yes Historical Provider, MD  Cholecalciferol (VITAMIN D PO) Take by mouth daily.   Yes Historical Provider, MD  iron polysaccharides (FERREX 150) 150 MG capsule Take 1 capsule (150 mg total) by mouth 2 (two) times daily. 05/23/12  Yes Chelle S Jeffery, PA-C  lisinopril (PRINIVIL,ZESTRIL) 20 MG tablet Take 1 tablet (20 mg total) by mouth 2 (two) times daily. 05/23/12  Yes Chelle Tessa Lerner, PA-C  Multiple Vitamin CAPS Take by mouth daily.   Yes Historical Provider, MD  Omega-3 Fatty Acids (FISH OIL) 1200 MG CAPS Take 2 capsules by mouth daily.   Yes Historical Provider, MD  fluticasone (FLONASE) 50 MCG/ACT nasal spray Place 2 sprays into the nose daily. 05/23/12   Chelle Tessa Lerner, PA-C    Allergies  Allergen Reactions  . Adhesive (Tape) Rash    Skin Peels    History   Social History  . Marital Status: Divorced    Spouse Name: N/A    Number of Children: 3  . Years of Education: 12   Occupational History  . 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  [employer]   Social History Main Topics  . Smoking status: Never Smoker   . Smokeless tobacco: Never Used  . Alcohol Use: Yes  Comment: occasionally  . Drug Use: No  . Sexually Active: Not Currently -- Female partner(s)     Comment: not active since divorce from husband 2010   Other Topics Concern  . Not on file   Social History Narrative   Living with mother, who has developed dementia.  She and her siblings are taking turns staying with her.  She'll move back home in 07/2012.    Family History  Problem Relation Age of Onset  . Diabetes Mother   . Hypertension Mother   . Dementia Mother   . Cancer Paternal Aunt     breast ca x 5 maternal aunts  . Diabetes Sister     Objective:   Physical Exam  Nursing note and vitals reviewed. Constitutional: She is oriented to person, place, and time. She appears  well-developed and well-nourished. No distress.  HENT:  Head: Normocephalic and atraumatic.  Right Ear: External ear normal.  Left Ear: External ear normal.  Nose: Nose normal.  Mouth/Throat: Oropharynx is clear and moist.  Eyes: Conjunctivae and EOM are normal. Pupils are equal, round, and reactive to light.  Neck: Normal range of motion. Neck supple.  Cardiovascular: Normal rate, regular rhythm and normal heart sounds.   No murmur heard. Pulmonary/Chest: Effort normal and breath sounds normal. She has no wheezes. She has no rales.  Lymphadenopathy:    She has no cervical adenopathy.  Neurological: She is alert and oriented to person, place, and time.  Skin: She is not diaphoretic.  Psychiatric: She has a normal mood and affect. Her behavior is normal. Judgment and thought content normal.    Results for orders placed in visit on 08/04/12  POCT RAPID STREP A (OFFICE)      Result Value Range   Rapid Strep A Screen Negative  Negative         Assessment & Plan:  Streptococcal sore throat - Plan: POCT rapid strep A, Culture, Group A Strep, amoxicillin (AMOXIL) 500 MG capsule  Cough - Plan: Culture, Group A Strep  Essential hypertension, benign - Plan: valsartan (DIOVAN) 80 MG tablet    1.  Pharyngitis:  New.  Rapid strep negative; send throat culture; rx for Amoxicillin provided while awaiting throat culture.  Recurrent issue for past two months; thus, must consider GERD induced throat inflammation if recurs or persists. 2.  Cough:  New.  Onset three months ago.  Concern regarding Lisinopril induced cough.  Stop Lisinopril; rx for Valsartan provided to use.  If no improvement in two weeks, to call office.  Also suffering with frequent GERD; thus, may warrant PPI to improve cough and pharyngitis. 3.  HTN:  Controlled; stop Lisinopril for next 2-4 weeks to see if cough improves; rx for Valsartan 80mg  daily provided; advised to check BP weekly with change in medication.  Will need  follow-up in one month to recheck BP.  Pt called to state that Valsartan too expensive; thus, rx for Cozaar/Losartan sent to pharmacy.  Meds ordered this encounter  Medications  . amoxicillin (AMOXIL) 500 MG capsule    Sig: Take 1 capsule (500 mg total) by mouth 3 (three) times daily.    Dispense:  30 capsule    Refill:  0  . valsartan (DIOVAN) 80 MG tablet    Sig: Take 0.5 tablets (40 mg total) by mouth daily.    Dispense:  30 tablet    Refill:  3  . losartan (COZAAR) 50 MG tablet    Sig: Take 1 tablet (50 mg  total) by mouth daily.    Dispense:  30 tablet    Refill:  3

## 2012-08-04 NOTE — Patient Instructions (Addendum)
Streptococcal sore throat - Plan: POCT rapid strep A, Culture, Group A Strep, amoxicillin (AMOXIL) 500 MG capsule  Cough - Plan: Culture, Group A Strep  Essential hypertension, benign - Plan: valsartan (DIOVAN) 80 MG tablet

## 2012-08-07 LAB — CULTURE, GROUP A STREP: Organism ID, Bacteria: NORMAL

## 2012-08-09 MED ORDER — LOSARTAN POTASSIUM 50 MG PO TABS: 50.0000 mg | ORAL_TABLET | Freq: Every day | ORAL | Status: DC

## 2012-11-28 ENCOUNTER — Encounter: Payer: Self-pay | Admitting: Physician Assistant

## 2012-11-28 ENCOUNTER — Encounter: Payer: BC Managed Care – PPO | Admitting: Physician Assistant

## 2012-11-28 NOTE — Progress Notes (Signed)
This encounter was created in error - please disregard.

## 2012-12-18 ENCOUNTER — Ambulatory Visit: Payer: BC Managed Care – PPO

## 2012-12-18 ENCOUNTER — Telehealth: Payer: Self-pay

## 2012-12-18 ENCOUNTER — Ambulatory Visit (INDEPENDENT_AMBULATORY_CARE_PROVIDER_SITE_OTHER): Payer: BC Managed Care – PPO | Admitting: Family Medicine

## 2012-12-18 VITALS — BP 152/90 | HR 84 | Temp 98.4°F | Resp 18 | Ht 66.0 in | Wt 262.0 lb

## 2012-12-18 DIAGNOSIS — M25559 Pain in unspecified hip: Secondary | ICD-10-CM

## 2012-12-18 DIAGNOSIS — M25561 Pain in right knee: Secondary | ICD-10-CM

## 2012-12-18 DIAGNOSIS — M25551 Pain in right hip: Secondary | ICD-10-CM

## 2012-12-18 DIAGNOSIS — M25511 Pain in right shoulder: Secondary | ICD-10-CM

## 2012-12-18 DIAGNOSIS — M25519 Pain in unspecified shoulder: Secondary | ICD-10-CM

## 2012-12-18 DIAGNOSIS — M25521 Pain in right elbow: Secondary | ICD-10-CM

## 2012-12-18 DIAGNOSIS — M25569 Pain in unspecified knee: Secondary | ICD-10-CM

## 2012-12-18 DIAGNOSIS — R51 Headache: Secondary | ICD-10-CM

## 2012-12-18 DIAGNOSIS — M25529 Pain in unspecified elbow: Secondary | ICD-10-CM

## 2012-12-18 DIAGNOSIS — T148XXA Other injury of unspecified body region, initial encounter: Secondary | ICD-10-CM

## 2012-12-18 MED ORDER — HYDROCODONE-ACETAMINOPHEN 5-325 MG PO TABS: 1.0000 | ORAL_TABLET | Freq: Four times a day (QID) | ORAL | Status: DC | PRN

## 2012-12-18 MED ORDER — CYCLOBENZAPRINE HCL 5 MG PO TABS: 5.0000 mg | ORAL_TABLET | Freq: Every evening | ORAL | Status: DC | PRN

## 2012-12-18 NOTE — Progress Notes (Signed)
 [facility]:  Office Visit  Chief Complaint:  Chief Complaint  Patient presents with  . fell down stairs yesterday    having pain all over, eyes swollen shut     HPI: Laurie Hodge is a 52 y.o. female who complains of  Falling down stairs. Sh hit her head but did not LOC , she has iced it and taken tylenol. NO AMS, no longer has HA. She has eye swelling, pain, also neck pain and shoulder pain.  Has additional  elbow pain and also knee pain. She fell over flipflops yesterday at 4 pm, did hit head but denies AMS. No n/v. No vision changes. She has been fine mentally .Denies weakness numbness tingling. Has been weightbearing but stiff, achey and pain everywhere. Her eye was normal yesterday but she slept on her right side and woke up this morning with swelling around her whole right eye. She has no paina nd no vision problmes, no double vision, no light sensitivity. Vit D deficiency but denies ostoporosis or osteopenia. Not on blood thinners.   Past Medical History  Diagnosis Date  . Obesity, Class III, BMI 40-49.9 (morbid obesity)   . GERD (gastroesophageal reflux disease)   . OSA on CPAP   . HTN (hypertension)   . HSV-2 infection     IgG  . Vitamin D insufficiency   . Hemorrhoid   . Iron (Fe) deficiency anemia   . Hyperlipidemia   . Allergy   . Depression   . Ulcer    Past Surgical History  Procedure Laterality Date  . Tubal ligation     History   Social History  . Marital Status: Divorced    Spouse Name: N/A    Number of Children: 3  . Years of Education: 12   Occupational History  . 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  [employer]   Social History Main Topics  . Smoking status: Never Smoker   . Smokeless tobacco: Never Used  . Alcohol Use: Yes     Comment: occasionally  . Drug Use: No  . Sexually Active: Not Currently -- Female partner(s)     Comment: not active since divorce from husband 2010   Other Topics Concern  .  None   Social History Narrative   Living with mother, who has developed dementia.  She and her siblings are taking turns staying with her.  She'll move back home in 07/2012.   Family History  Problem Relation Age of Onset  . Diabetes Mother   . Hypertension Mother   . Dementia Mother   . Cancer Paternal Aunt     breast ca x 5 maternal aunts  . Diabetes Sister    Allergies  Allergen Reactions  . Adhesive (Tape) Rash    Skin Peels   Prior to Admission medications   Medication Sig Start Date End Date Taking? Authorizing Provider  buPROPion (WELLBUTRIN XL) 300 MG 24 hr tablet TAKE ONE TABLET BY MOUTH EVERY DAY 07/09/12  Yes Marzella Schlein McClung, PA-C  cetirizine (ZYRTEC) 10 MG tablet Take 10 mg by mouth daily.   Yes Historical Provider, MD  iron polysaccharides (FERREX 150) 150 MG capsule Take 1 capsule (150 mg total) by mouth 2 (two) times daily. 05/23/12  Yes Chelle S Jeffery, PA-C  lisinopril (PRINIVIL,ZESTRIL) 20 MG tablet Take 1 tablet (20 mg total) by mouth 2 (two) times daily. 05/23/12  Yes Chelle S Jeffery, PA-C  Cholecalciferol (VITAMIN D PO) Take by  mouth daily.    Historical Provider, MD  fluticasone (FLONASE) 50 MCG/ACT nasal spray Place 2 sprays into the nose daily. 05/23/12   Chelle S Jeffery, PA-C  losartan (COZAAR) 50 MG tablet Take 1 tablet (50 mg total) by mouth daily. 08/09/12   Ethelda Chick, MD  Multiple Vitamin CAPS Take by mouth daily.    Historical Provider, MD  Omega-3 Fatty Acids (FISH OIL) 1200 MG CAPS Take 2 capsules by mouth daily.    Historical Provider, MD  valsartan (DIOVAN) 80 MG tablet Take 0.5 tablets (40 mg total) by mouth daily. 08/04/12   Ethelda Chick, MD     ROS: The patient denies fevers, chills, night sweats, unintentional weight loss, chest pain, palpitations, wheezing, dyspnea on exertion, nausea, vomiting, abdominal pain, dysuria, hematuria, melena, numbness, weakness, or tingling.    All other systems have been reviewed and were otherwise negative  with the exception of those mentioned in the HPI and as above.    PHYSICAL EXAM: Filed Vitals:   12/18/12 0901  BP: 152/90  Pulse: 84  Temp: 98.4 F (36.9 C)  Resp: 18   Filed Vitals:   12/18/12 0901  Height: 5\' 6"  (1.676 m)  Weight: 262 lb (118.842 kg)   Body mass index is 42.31 kg/(m^2).  General: Alert, no acute distress HEENT:  Normocephalic, atraumatic, oropharynx patent. + right swollen periorbital area-due to sleeping on right side and pull of gravity, she was able to open eye and swelling was significantly less at end of office visit.  Cardiovascular:  Regular rate and rhythm, no rubs murmurs or gallops.  No Carotid bruits, radial pulse intact. No pedal edema.  Respiratory: Clear to auscultation bilaterally.  No wheezes, rales, or rhonchi.  No cyanosis, no use of accessory musculature GI: No organomegaly, abdomen is soft and non-tender, positive bowel sounds.  No masses. Skin: No rashes. Neurologic: Facial musculature symmetric. Psychiatric: Patient is appropriate throughout our interaction. Lymphatic: No cervical lymphadenopathy Musculoskeletal: Gait intact. Tender neck, tender shoulder, tneder humerus, tender elbow, tender knee, tender hip Decrease ROM due to pain + lip swelling, abrasion of gums no dentition issues   LABS: Results for orders placed in visit on 08/04/12  CULTURE, GROUP A STREP SCREEN      Result Value Range   Organism ID, Bacteria Normal Upper Respiratory Flora     Organism ID, Bacteria No Beta Hemolytic Streptococci Isolated    POCT RAPID STREP A (OFFICE)      Result Value Range   Rapid Strep A Screen Negative  Negative     EKG/XRAY:   Primary read interpreted by Dr. Conley Rolls at [facility]. No obvious fractures or dislocation of all xrays   ASSESSMENT/PLAN: Encounter Diagnoses  Name Primary?  . Pain in joint, shoulder region, right Yes  . Pain in joint, upper arm, right   . Hip pain, acute, right   . Knee pain, acute, right   . Face pain   .  Sprain and strain    Increase ambulation to get out stiffness in body since movement seems to help her msk and joints. Rx flexeril,  OTC tylenol. Decline pain meds. Will f/u with radiology report Monitor for worsening sxs or changes F/u prn Gross sideeffects, risk and benefits, and alternatives of medications d/w patient. Patient is aware that all medications have potential sideeffects and we are unable to predict every sideeffect or drug-drug interaction that may occur.   12/18/12 addendum---D/w patient AC jt separation Type 2.  NO surgery needed.  RICE, Sling. Immobilize for 3-4 days then start doing ROM exercises. F/u with Chelle on 12/26/12  ,  PHUONG, DO 12/18/2012 10:45 AM

## 2012-12-18 NOTE — Telephone Encounter (Signed)
PT STATES SHE WAS SEEN BY DR LE AND DIDN'T WANT ANY PAIN MEDICINE BUT NOW SHE DOES PLEASE CALL [phone]    [facility] [address]

## 2012-12-22 ENCOUNTER — Telehealth: Payer: Self-pay

## 2012-12-22 NOTE — Telephone Encounter (Signed)
Message copied by Johnnette Litter on Sun Dec 22, 2012  2:23 PM ------      Message from: Lenell Antu      Created: Sat Dec 21, 2012  8:11 PM       Can you create a letter for Ms. Lesmeister with her shoulder xray results. She can  follow-up with Chelle regarding this on her office visit on 12/26/12. IF she has any other questions or has more pain then she should come in sooner.             Thanks Tle ------

## 2012-12-23 NOTE — Telephone Encounter (Signed)
Letter sent.

## 2012-12-26 ENCOUNTER — Encounter: Payer: Self-pay | Admitting: Physician Assistant

## 2012-12-26 ENCOUNTER — Ambulatory Visit (INDEPENDENT_AMBULATORY_CARE_PROVIDER_SITE_OTHER): Payer: BC Managed Care – PPO | Admitting: Physician Assistant

## 2012-12-26 VITALS — BP 154/92 | HR 65 | Temp 98.7°F | Resp 16 | Ht 65.5 in | Wt 261.0 lb

## 2012-12-26 DIAGNOSIS — J309 Allergic rhinitis, unspecified: Secondary | ICD-10-CM

## 2012-12-26 DIAGNOSIS — M722 Plantar fascial fibromatosis: Secondary | ICD-10-CM

## 2012-12-26 DIAGNOSIS — Z0000 Encounter for general adult medical examination without abnormal findings: Secondary | ICD-10-CM

## 2012-12-26 DIAGNOSIS — D649 Anemia, unspecified: Secondary | ICD-10-CM

## 2012-12-26 DIAGNOSIS — I10 Essential (primary) hypertension: Secondary | ICD-10-CM

## 2012-12-26 DIAGNOSIS — E559 Vitamin D deficiency, unspecified: Secondary | ICD-10-CM

## 2012-12-26 DIAGNOSIS — E785 Hyperlipidemia, unspecified: Secondary | ICD-10-CM

## 2012-12-26 DIAGNOSIS — M2550 Pain in unspecified joint: Secondary | ICD-10-CM

## 2012-12-26 DIAGNOSIS — R700 Elevated erythrocyte sedimentation rate: Secondary | ICD-10-CM

## 2012-12-26 LAB — CBC WITH DIFFERENTIAL/PLATELET
Basophils Relative: 0 % (ref 0–1)
Hemoglobin: 9.8 g/dL — ABNORMAL LOW (ref 12.0–15.0)
MCHC: 32.1 g/dL (ref 30.0–36.0)
Monocytes Relative: 10 % (ref 3–12)
Neutro Abs: 2.3 10*3/uL (ref 1.7–7.7)
Neutrophils Relative %: 51 % (ref 43–77)
Platelets: 483 10*3/uL — ABNORMAL HIGH (ref 150–400)
RBC: 4.16 MIL/uL (ref 3.87–5.11)

## 2012-12-26 LAB — POCT UA - MICROSCOPIC ONLY
WBC, Ur, HPF, POC: NEGATIVE
Yeast, UA: NEGATIVE

## 2012-12-26 LAB — POCT URINALYSIS DIPSTICK
Bilirubin, UA: NEGATIVE
Glucose, UA: NEGATIVE
Ketones, UA: NEGATIVE
Leukocytes, UA: NEGATIVE
Nitrite, UA: NEGATIVE
pH, UA: 7

## 2012-12-26 LAB — COMPREHENSIVE METABOLIC PANEL
ALT: 20 U/L (ref 0–35)
AST: 19 U/L (ref 0–37)
Albumin: 4.3 g/dL (ref 3.5–5.2)
Alkaline Phosphatase: 76 U/L (ref 39–117)
Calcium: 9.2 mg/dL (ref 8.4–10.5)
Chloride: 103 mEq/L (ref 96–112)
Potassium: 4.3 mEq/L (ref 3.5–5.3)
Sodium: 135 mEq/L (ref 135–145)
Total Protein: 7.3 g/dL (ref 6.0–8.3)

## 2012-12-26 LAB — TSH: TSH: 1.864 u[IU]/mL (ref 0.350–4.500)

## 2012-12-26 LAB — POCT SEDIMENTATION RATE: POCT SED RATE: 64 mm/hr — AB (ref 0–22)

## 2012-12-26 LAB — LIPID PANEL
HDL: 39 mg/dL — ABNORMAL LOW (ref >39)
LDL Cholesterol: 158 mg/dL — ABNORMAL HIGH (ref 0–99)

## 2012-12-26 LAB — RHEUMATOID FACTOR: Rhuematoid fact SerPl-aCnc: 14 IU/mL (ref <=14)

## 2012-12-26 MED ORDER — MELOXICAM 15 MG PO TABS: 15.0000 mg | ORAL_TABLET | Freq: Every day | ORAL | Status: DC

## 2012-12-26 NOTE — Patient Instructions (Addendum)
Keeping You Healthy  Get These Tests 1. Blood Pressure- Have your blood pressure checked once a year by your health care provider.  Normal blood pressure is 120/80. 2. Weight- Have your body mass index (BMI) calculated to screen for obesity.  BMI is measure of body fat based on height and weight.  You can also calculate your own BMI at https://www.west-esparza.com/. 3. Cholesterol- Have your cholesterol checked every 5 years starting at age 50 then yearly starting at age 56. 4. Chlamydia, HIV, and other sexually transmitted diseases- Get screened every year until age 64, then within three months of each new sexual provider. 5. Pap Smear- Every 1-3 years; discuss with your health care provider. 6. Mammogram- Every year starting at age 9  Take these medicines  Calcium with Vitamin D-Your body needs 1200 mg of Calcium each day and [phone] IU of Vitamin D daily.  Your body can only absorb 500 mg of Calcium at a time so Calcium must be taken in 2 or 3 divided doses throughout the day.  Multivitamin with folic acid- Once daily if it is possible for you to become pregnant.  Get these Immunizations  Gardasil-Series of three doses; prevents HPV related illness such as genital warts and cervical cancer.  Menactra-Single dose; prevents meningitis.  Tetanus shot- Every 10 years.  Flu shot-Every year.  Take these steps 1. Do not smoke-Your healthcare provider can help you quit.  For tips on how to quit go to www.smokefree.gov or call 1-800 QUITNOW. 2. Be physically active- Exercise 5 days a week for at least 30 minutes.  If you are not already physically active, start slow and gradually work up to 30 minutes of moderate physical activity.  Examples of moderate activity include walking briskly, dancing, swimming, bicycling, etc. 3. Breast Cancer- A self breast exam every month is important for early detection of breast cancer.  For more information and instruction on self breast exams, ask your  healthcare provider or [place].es. 4. Eat a healthy diet- Eat a variety of healthy foods such as fruits, vegetables, whole grains, low fat milk, low fat cheeses, yogurt, lean meats, poultry and fish, beans, nuts, tofu, etc.  For more information go to www. Thenutritionsource.org 5. Drink alcohol in moderation- Limit alcohol intake to one drink or less per day. Never drink and drive. 6. Depression- Your emotional health is as important as your physical health.  If you're feeling down or losing interest in things you normally enjoy please talk to your healthcare provider about being screened for depression. 7. Dental visit- Brush and floss your teeth twice daily; visit your dentist twice a year. 8. Eye doctor- Get an eye exam at least every 2 years. 9. Helmet use- Always wear a helmet when riding a bicycle, motorcycle, rollerblading or skateboarding. 10. Safe sex- If you may be exposed to sexually transmitted infections, use a condom. 11. Seat belts- Seat belts can save your live; always wear one. 12. Smoke/Carbon Monoxide detectors- These detectors need to be installed on the appropriate level of your home. Replace batteries at least once a year. 13. Skin cancer- When out in the sun please cover up and use sunscreen 15 SPF or higher. 14. Violence- If anyone is threatening or hurting you, please tell your healthcare provider.   Plantar Fasciitis Plantar fasciitis is a common condition that causes foot pain. It is soreness (inflammation) of the band of tough fibrous tissue on the bottom of the foot that runs from the heel bone (calcaneus) to the  ball of the foot. The cause of this soreness may be from excessive standing, poor fitting shoes, running on hard surfaces, being overweight, having an abnormal walk, or overuse (this is common in runners) of the painful foot or feet. It is also common in aerobic exercise dancers and ballet dancers. SYMPTOMS  Most people with  plantar fasciitis complain of:  Severe pain in the morning on the bottom of their foot especially when taking the first steps out of bed. This pain recedes after a few minutes of walking.  Severe pain is experienced also during walking following a long period of inactivity.  Pain is worse when walking barefoot or up stairs DIAGNOSIS   Your caregiver will diagnose this condition by examining and feeling your foot.  Special tests such as X-rays of your foot, are usually not needed. PREVENTION   Consult a sports medicine professional before beginning a new exercise program.  Walking programs offer a good workout. With walking there is a lower chance of overuse injuries common to runners. There is less impact and less jarring of the joints.  Begin all new exercise programs slowly. If problems or pain develop, decrease the amount of time or distance until you are at a comfortable level.  Wear good shoes and replace them regularly.  Stretch your foot and the heel cords at the back of the ankle (Achilles tendon) both before and after exercise.  Run or exercise on even surfaces that are not hard. For example, asphalt is better than pavement.  Do not run barefoot on hard surfaces.  If using a treadmill, vary the incline.  Do not continue to workout if you have foot or joint problems. Seek professional help if they do not improve. HOME CARE INSTRUCTIONS   Avoid activities that cause you pain until you recover.  Use ice or cold packs on the problem or painful areas after working out.  Only take over-the-counter or prescription medicines for pain, discomfort, or fever as directed by your caregiver.  Soft shoe inserts or athletic shoes with air or gel sole cushions may be helpful.  If problems continue or become more severe, consult a sports medicine caregiver or your own health care provider. Cortisone is a potent anti-inflammatory medication that may be injected into the painful area.  You can discuss this treatment with your caregiver. MAKE SURE YOU:   Understand these instructions.  Will watch your condition.  Will get help right away if you are not doing well or get worse. Document Released: 02/28/2001 Document Revised: 08/28/2011 Document Reviewed: 04/29/2008 [facility] Patient Information 2014 [place], [place].

## 2012-12-26 NOTE — Progress Notes (Signed)
  Subjective:    Patient ID: Laurie Hodge, female    DOB: 06-Mar-1961, 52 y.o.   MRN: [id number]  HPI  I have examined this patient along with the student and agree.   Review of Systems  Constitutional: Positive for activity change and fatigue.  HENT: Positive for tinnitus.   Eyes: Positive for visual disturbance.  Respiratory: Positive for apnea.   Cardiovascular: Positive for leg swelling.  Gastrointestinal: Negative.   Genitourinary: Positive for menstrual problem.  Musculoskeletal: Positive for myalgias and back pain.  Skin: Negative.   Neurological: Positive for dizziness and weakness.  Hematological: Negative.   Psychiatric/Behavioral: Positive for decreased concentration.       Objective:   Physical Exam        Assessment & Plan:  Routine general medical examination at a health care facility - Plan: POCT UA - Microscopic Only, POCT urinalysis dipstick; Age appropriate anticipatory guidance provided.  HTN (hypertension) - Plan: TSH, Comprehensive metabolic panel  Anemia - Plan: CBC with Differential  Hyperlipidemia - Plan: Lipid panel  AR (allergic rhinitis)  Vitamin D insufficiency - Plan: Vitamin D 25 hydroxy  Obesity, Class III, BMI 40-49.9 (morbid obesity) - Plan: ANA, Rheumatoid factor, Ambulatory referral to diabetic education  Joint pain - Plan: POCT SEDIMENTATION RATE, meloxicam (MOBIC) 15 MG tablet  Plantar fasciitis - Plan: meloxicam (MOBIC) 15 MG tablet  Fernande Bras, PA-C Physician Assistant-Certified [facility]

## 2012-12-26 NOTE — Progress Notes (Signed)
  Subjective:    Patient ID: Laurie Hodge, female    DOB: May 23, 1961, 52 y.o.   MRN: [id number]  HPI  Presents for annual physical. Has concerns for weight control, joint pain, and pain/swelling in feet. States she enjoys walking for weight control but has not been able to get back into walking daily. For healthy eating she states she "doesnt' know where to start". Joint pain is most pronounced in knees, hips, elbows, and wrists, worse on Right side than left at all joints. Stiffness for about 1 hour each morning is severe, increased activity level decreases pain, pain is sharp and stabbing. Pain in feet is located at the heels, worse at night before bed, worse on days she is most active.   States she has been under large amounts of stress with her mother having a stroke in December 2013. She has just returned to her own home after living with her mother for the past year to provide care for her. Mother also has dementia.    Review of Systems Constitutional: Positive for activity change and fatigue.  HENT: Positive for tinnitus.  Eyes: Positive for visual disturbance.  Respiratory: Positive for apnea.  Cardiovascular: Positive for leg swelling.  Gastrointestinal: Negative.  Genitourinary: Positive for menstrual problem.  Musculoskeletal: Positive for myalgias and back pain.  Skin: Negative.  Neurological: Positive for dizziness and weakness.  Hematological: Negative.  Psychiatric/Behavioral: Positive for decreased concentration.     Objective:   Physical Exam  BP 154/92  Pulse 65  Temp(Src) 98.7 F (37.1 C) (Oral)  Resp 16  Ht 5' 5.5" (1.664 m)  Wt 261 lb (118.389 kg)  BMI 42.76 kg/m2  SpO2 100%  LMP 12/08/2012  General: WDWN female, appears stated age, NAD Head: normocephalic, atraumatic Eyes: PERRLA, mild erythematous abrasion on R eye, L sclera/conjunctiva clear  Ears: TMs clear with visible bony landmarks  Nose: turbinates normal without erythema edema or discharge   Throat: moist mucous membranes, uvula midline, posterior pharynx without erythema edema or exudate Neck: supple, no palpable/tender lymphadenopathy  Resp: good air entry, CTA bilaterally without rales rhonchi wheezes Cardiac: RRR, S1S2 appreciated, no murmurs rubs gallops  Extremities: moves all limbs spontaneously, full ROM, 2+ reflexes biceps brachioradialis patellar achilles, radial and dorsalis pedis pulse intact and even bilaterally  Neuro: A&O x 3 Skin: healing ecchymosis under R eye, no rashes lesions      Assessment & Plan:   Routine general medical examination at a health care facility - Plan: POCT UA - Microscopic Only, POCT urinalysis dipstick  HTN (hypertension) - Plan: TSH, Comprehensive metabolic panel  Anemia - Plan: CBC with Differential  Hyperlipidemia - Plan: Lipid panel  AR (allergic rhinitis)  Vitamin D insufficiency - Plan: Vitamin D 25 hydroxy  Obesity, Class III, BMI 40-49.9 (morbid obesity) - Plan: ANA, Rheumatoid factor, Ambulatory referral to diabetic education  Joint pain - Plan: POCT SEDIMENTATION RATE, meloxicam (MOBIC) 15 MG tablet  Plantar fasciitis - Plan: meloxicam (MOBIC) 15 MG tablet  Refer to nutritionist for dietary counseling.  Counseled on walking program.

## 2012-12-27 LAB — VITAMIN D 25 HYDROXY (VIT D DEFICIENCY, FRACTURES): Vit D, 25-Hydroxy: 33 ng/mL (ref 30–89)

## 2012-12-27 LAB — ANA: Anti Nuclear Antibody(ANA): NEGATIVE

## 2012-12-30 NOTE — Addendum Note (Signed)
Addended by: Johnnette Litter on: 12/30/2012 11:01 AM   Modules accepted: Orders

## 2013-01-01 ENCOUNTER — Encounter: Payer: Self-pay | Admitting: Internal Medicine

## 2013-01-08 ENCOUNTER — Encounter: Payer: Self-pay | Admitting: Physician Assistant

## 2013-01-08 LAB — CHG MAMMOGRAM, SCREENING: Results: NEGATIVE

## 2013-01-22 ENCOUNTER — Ambulatory Visit (INDEPENDENT_AMBULATORY_CARE_PROVIDER_SITE_OTHER): Payer: BC Managed Care – PPO | Admitting: Internal Medicine

## 2013-01-22 ENCOUNTER — Encounter: Payer: Self-pay | Admitting: Internal Medicine

## 2013-01-22 ENCOUNTER — Other Ambulatory Visit (INDEPENDENT_AMBULATORY_CARE_PROVIDER_SITE_OTHER): Payer: BC Managed Care – PPO

## 2013-01-22 VITALS — BP 122/82 | HR 71 | Ht 65.5 in | Wt 264.2 lb

## 2013-01-22 DIAGNOSIS — Z1211 Encounter for screening for malignant neoplasm of colon: Secondary | ICD-10-CM

## 2013-01-22 DIAGNOSIS — D509 Iron deficiency anemia, unspecified: Secondary | ICD-10-CM

## 2013-01-22 DIAGNOSIS — K219 Gastro-esophageal reflux disease without esophagitis: Secondary | ICD-10-CM

## 2013-01-22 LAB — CBC
HCT: 31 % — ABNORMAL LOW (ref 36.0–46.0)
Hemoglobin: 9.8 g/dL — ABNORMAL LOW (ref 12.0–15.0)
MCHC: 31.6 g/dL (ref 30.0–36.0)
Platelets: 479 10*3/uL — ABNORMAL HIGH (ref 150.0–400.0)
RDW: 17 % — ABNORMAL HIGH (ref 11.5–14.6)

## 2013-01-22 MED ORDER — MOVIPREP 100 G PO SOLR: ORAL | Status: DC

## 2013-01-22 NOTE — Patient Instructions (Addendum)
You have been scheduled for a colonoscopy/endoscopy with propofol. Please follow written instructions given to you at your visit today.  Please pick up your prep kit at the pharmacy within the next 1-3 days. If you use inhalers (even only as needed), please bring them with you on the day of your procedure. Your physician has requested that you go to www.[email or web address].com and enter the access code given to you at your visit today. This web site gives a general overview about your procedure. However, you should still follow specific instructions given to you by our office regarding your preparation for the procedure.  Your physician has requested that you go to the basement for lab work before leaving today                                               We are excited to introduce MyChart, a new best-in-class service that provides you online access to important information in your electronic medical record. We want to make it easier for you to view your health information - all in one secure location - when and where you need it. We expect MyChart will enhance the quality of care and service we provide.  When you register for MyChart, you can:    View your test results.    Request appointments and receive appointment reminders via email.    Request medication renewals.    View your medical history, allergies, medications and immunizations.    Communicate with your physician's office through a password-protected site.    Conveniently print information such as your medication lists.  To find out if MyChart is right for you, please talk to a member of our clinical staff today. We will gladly answer your questions about this free health and wellness tool.  If you are age 50 or older and want a member of your family to have access to your record, you must provide written consent by completing a proxy form available at our office. Please speak to our clinical staff about guidelines regarding accounts  for patients younger than age 13.  As you activate your MyChart account and need any technical assistance, please call the MyChart technical support line at [phone]) or email your question to [email or web address]. If you email your question(s), please include your name, a return phone number and the best time to reach you.  If you have non-urgent health-related questions, you can send a message to our office through MyChart at [email or web address]. If you have a medical emergency, call 911.  Thank you for using MyChart as your new health and wellness resource!   MyChart licensed from [employer],  9811-9147. Patents Pending.

## 2013-01-22 NOTE — Progress Notes (Signed)
Patient ID: Laurie Hodge, female   DOB: [birth year], 52 y.o.   MRN: [id number] HPI: Laurie Hodge is a 52 yo female with PMH of iron deficiency anemia, GERD, OSA, hypertension, vitamin D deficiency, hyperlipidemia, and remote H. pylori infection status post antibiotic therapy he was seen in consultation at the request of Theora Gianotti, PA-C for evaluation of iron deficiency anemia and screening colonoscopy.  Patient is here alone today.  She reports she has a somewhat long-standing history of constipation and intermittent issues with hemorrhoids. Her hemorrhoids began after childbirth. She reports these occasionally bother her at times she could see bright red blood per rectum after bowel movement.  She is not taking anything specifically for constipation at this time. Most recently she's been having one stool daily, but occasionally can skip a day and have hard stools. She reports long-standing GERD symptoms. She has heartburn one to 2 days a week often associated with waterbrash. Symptoms are worse at night. No dysphagia or odynophagia. No nausea or vomiting. Appetite is good. She did take omeprazole for some time but discontinued it due to to developing phlegm she felt was related to the medication.  She denies blood in her stool other than hemorrhoids or melena.  She does report heavy menstrual periods occurring every 28 days and lasting about 7 days. Last menstrual cycle was 01/07/2013. She does remain on oral iron supplementation which she has been on for some time. She recalls a history of H. pylori and being diagnosed clinically with an ulcer about 20 years ago. She does recall antibiotics which she completed and improved her symptoms. She did not have an upper endoscopy. She was previously using meloxicam but currently is using no NSAIDs.  No family history of IBD or malignancy. She notes her mother to have GERD, PUD and hemorrhoids.  Past Medical History  Diagnosis Date  . Obesity, Class III, BMI  40-49.9 (morbid obesity)   . GERD (gastroesophageal reflux disease)   . OSA on CPAP   . HTN (hypertension)   . HSV-2 infection     IgG  . Vitamin D insufficiency   . Hemorrhoid   . Iron (Fe) deficiency anemia   . Hyperlipidemia   . Allergy   . Depression   . Ulcer     Past Surgical History  Procedure Laterality Date  . Tubal ligation      Current Outpatient Prescriptions  Medication Sig Dispense Refill  . buPROPion (WELLBUTRIN XL) 300 MG 24 hr tablet TAKE ONE TABLET BY MOUTH EVERY DAY  30 tablet  4  . cetirizine (ZYRTEC) 10 MG tablet Take 10 mg by mouth daily.      . cyclobenzaprine (FLEXERIL) 5 MG tablet Take 1 tablet (5 mg total) by mouth at bedtime as needed.  30 tablet  0  . iron polysaccharides (FERREX 150) 150 MG capsule Take 1 capsule (150 mg total) by mouth 2 (two) times daily.  180 capsule  3  . lisinopril (PRINIVIL,ZESTRIL) 20 MG tablet Take 1 tablet (20 mg total) by mouth 2 (two) times daily.  180 tablet  1  . meloxicam (MOBIC) 15 MG tablet Take 1 tablet (15 mg total) by mouth daily.  30 tablet  1  . MOVIPREP 100 G SOLR Use per prep instruction  1 kit  0   No current facility-administered medications for this visit.    Allergies  Allergen Reactions  . Adhesive (Tape) Rash    Skin Peels    Family History  Problem Relation  Age of Onset  . Diabetes Mother   . Hypertension Mother   . Dementia Mother   . Cancer Paternal Aunt     breast ca x 5 maternal aunts  . Diabetes Sister     History  Substance Use Topics  . Smoking status: Never Smoker   . Smokeless tobacco: Never Used  . Alcohol Use: Yes     Comment: occasionally    ROS: As per history of present illness, otherwise negative  BP 122/82  Pulse 71  Ht 5' 5.5" (1.664 m)  Wt 264 lb 3.2 oz (119.84 kg)  BMI 43.28 kg/m2  SpO2 98% Constitutional: Well-developed and well-nourished. No distress. HEENT: Normocephalic and atraumatic. Oropharynx is clear and moist. No oropharyngeal exudate.  Conjunctivae are normal.  No scleral icterus. Neck: Neck supple. Trachea midline. Cardiovascular: Normal rate, regular rhythm and intact distal pulses.  Pulmonary/chest: Effort normal and breath sounds normal. No wheezing, rales or rhonchi. Abdominal: Soft, nontender, nondistended. Bowel sounds active throughout.  Extremities: no clubbing, cyanosis, or edema Neurological: Alert and oriented to person place and time. Skin: Skin is warm and dry. No rashes noted. Psychiatric: Normal mood and affect. Behavior is normal.  RELEVANT LABS AND IMAGING: CBC    Component Value Date/Time   WBC 7.7 01/22/2013 1534   WBC 4.5* 10/24/2011 1228   RBC 3.97 01/22/2013 1534   RBC 4.54 10/24/2011 1228   HGB 9.8* 01/22/2013 1534   HGB 11.0* 10/24/2011 1228   HCT 31.0* 01/22/2013 1534   HCT 36.5* 10/24/2011 1228   PLT 479.0* 01/22/2013 1534   MCV 78.2 01/22/2013 1534   MCV 80.5 10/24/2011 1228   MCH 23.6* 12/26/2012 1433   MCH 24.2* 10/24/2011 1228   MCHC 31.6 01/22/2013 1534   MCHC 30.1* 10/24/2011 1228   RDW 17.0* 01/22/2013 1534   LYMPHSABS 1.7 12/26/2012 1433   MONOABS 0.5 12/26/2012 1433   EOSABS 0.1 12/26/2012 1433   BASOSABS 0.0 12/26/2012 1433    CMP     Component Value Date/Time   NA 135 12/26/2012 1433   K 4.3 12/26/2012 1433   CL 103 12/26/2012 1433   CO2 24 12/26/2012 1433   GLUCOSE 78 12/26/2012 1433   BUN 8 12/26/2012 1433   CREATININE 0.72 12/26/2012 1433   CALCIUM 9.2 12/26/2012 1433   PROT 7.3 12/26/2012 1433   ALBUMIN 4.3 12/26/2012 1433   AST 19 12/26/2012 1433   ALT 20 12/26/2012 1433   ALKPHOS 76 12/26/2012 1433   BILITOT 0.4 12/26/2012 1433   Iron/TIBC/Ferritin    Component Value Date/Time   IRON 116 01/22/2013 1534   FERRITIN 9.6* 01/22/2013 1534     ASSESSMENT/PLAN: 52 yo female with PMH of iron deficiency anemia, GERD, OSA, hypertension, vitamin D deficiency, hyperlipidemia, and remote H. pylori infection status post antibiotic therapy he was seen in consultation at the request of Theora Gianotti, PA-C  for evaluation of iron deficiency anemia and screening colonoscopy.    1. IDA/CRC screening -- we discussed her iron deficiency anemia today and I have recommended colonoscopy both to evaluate this anemia and also for colorectal cancer screening. Also recommended proceeding to upper endoscopy at the same time given her history of long-standing GERD and iron deficiency anemia. We discussed the test today including the risks and benefits and she is agreeable to proceed.  2.  GERD -- symptoms occurring twice per week. I have recommended ranitidine or famotidine over-the-counter per box instructions on an as-needed basis. Further recommendations after endoscopy  3.  Constipation/hemorrhoids -- constipation is not a major issue for her at present and therefore no laxative will be initiated at this time. Hemorrhoids will be evaluated at the time of colonoscopy and can be treated thereafter

## 2013-01-28 ENCOUNTER — Encounter: Payer: Self-pay | Admitting: Physician Assistant

## 2013-01-29 ENCOUNTER — Encounter: Payer: Self-pay | Admitting: Internal Medicine

## 2013-01-29 ENCOUNTER — Ambulatory Visit (AMBULATORY_SURGERY_CENTER): Payer: BC Managed Care – PPO | Admitting: Internal Medicine

## 2013-01-29 VITALS — BP 136/74 | HR 54 | Temp 98.6°F | Resp 23 | Ht 65.0 in | Wt 264.0 lb

## 2013-01-29 DIAGNOSIS — K219 Gastro-esophageal reflux disease without esophagitis: Secondary | ICD-10-CM

## 2013-01-29 DIAGNOSIS — Z1211 Encounter for screening for malignant neoplasm of colon: Secondary | ICD-10-CM

## 2013-01-29 DIAGNOSIS — K2970 Gastritis, unspecified, without bleeding: Secondary | ICD-10-CM

## 2013-01-29 DIAGNOSIS — D126 Benign neoplasm of colon, unspecified: Secondary | ICD-10-CM

## 2013-01-29 DIAGNOSIS — K2990 Gastroduodenitis, unspecified, without bleeding: Secondary | ICD-10-CM

## 2013-01-29 DIAGNOSIS — K649 Unspecified hemorrhoids: Secondary | ICD-10-CM

## 2013-01-29 DIAGNOSIS — D509 Iron deficiency anemia, unspecified: Secondary | ICD-10-CM

## 2013-01-29 MED ORDER — SODIUM CHLORIDE 0.9 % IV SOLN: 500.0000 mL | INTRAVENOUS | Status: DC

## 2013-01-29 MED ORDER — PRAMOXINE-HC 1-1 % EX CREA: TOPICAL_CREAM | Freq: Three times a day (TID) | CUTANEOUS | Status: DC

## 2013-01-29 NOTE — Op Note (Signed)
[facility] [address] [place], [address]   COLONOSCOPY PROCEDURE REPORT  PATIENT: Laurie Hodge, Laurie Hodge  MR#: [id number] BIRTHDATE: [birth year] , 52  yrs. old GENDER: Female ENDOSCOPIST: Beverley Fiedler, MD REFERRED QI:[id number] Tinnie Gens, [place] PROCEDURE DATE:  01/29/2013 PROCEDURE:   Colonoscopy with cold biopsy polypectomy First Screening Colonoscopy - Avg.  risk and is 50 yrs.  old or older Yes.  Prior Negative Screening - Now for repeat screening. N/A  History of Adenoma - Now for follow-up colonoscopy & has been > or = to 3 yrs.  N/A  Polyps Removed Today? Yes. ASA CLASS:   Class II INDICATIONS:average risk screening and Iron Deficiency Anemia. MEDICATIONS: MAC sedation, administered by CRNA, Propofol (Diprivan), and Propofol (Diprivan) 220 mg IV  DESCRIPTION OF PROCEDURE:   After the risks benefits and alternatives of the procedure were thoroughly explained, informed consent was obtained.  A digital rectal exam revealed external hemorrhoids.   The LB [id number]  endoscope was introduced through the anus and advanced to the cecum, which was identified by both the appendix and ileocecal valve. No adverse events experienced.   The quality of the prep was good, using MoviPrep The instrument was then slowly withdrawn as the colon was fully examined.   COLON FINDINGS: A sessile polyp measuring 3 mm in size was found in the sigmoid colon.  A polypectomy was performed with cold forceps. The resection was complete and the polyp tissue was completely retrieved.   The colon mucosa was otherwise normal.   Large external hemorrhoids were found.  Retroflexed views revealed no abnormalities. The time to cecum=7 minutes 54 seconds.  Withdrawal time=9 minutes 57 seconds.  The scope was withdrawn and the procedure completed. COMPLICATIONS: There were no complications.  ENDOSCOPIC IMPRESSION: 1.   Sessile polyp measuring 3 mm in size was found in the sigmoid colon;  polypectomy was performed with cold forceps 2.   The colon mucosa was otherwise normal 3.   Large external hemorrhoids  RECOMMENDATIONS: 1.  Await pathology results 2.  If the polyp removed today is proven to be an adenomatous (pre-cancerous) polyp, you will need a repeat colonoscopy in 5 years.  Otherwise you should continue to follow colorectal cancer screening guidelines for "routine risk" patients with colonoscopy in 10 years.  You will receive a letter within 1-2 weeks with the results of your biopsy as well as final recommendations.  Please call my office if you have not received a letter after 3 weeks.   eSigned:  Beverley Fiedler, MD 01/29/2013 4:09 PM cc: The Patient and Theora Gianotti, [place]

## 2013-01-29 NOTE — Progress Notes (Signed)
Patient did not experience any of the following events: a burn prior to discharge; a fall within the facility; wrong site/side/patient/procedure/implant event; or a hospital transfer or hospital admission upon discharge from the facility. (G8907) Patient did not have preoperative order for IV antibiotic SSI prophylaxis. (G8918)  

## 2013-01-29 NOTE — Progress Notes (Signed)
Called to room to assist during endoscopic procedure.  Patient ID and intended procedure confirmed with present staff. Received instructions for my participation in the procedure from the performing physician.  

## 2013-01-29 NOTE — Patient Instructions (Signed)

## 2013-01-29 NOTE — Progress Notes (Signed)
Pt sprayed with Cetacaine orally prior to EGD Tolerated well.  1605 To PACU Stable

## 2013-01-29 NOTE — Op Note (Signed)
[facility] [address] [place], [address]   ENDOSCOPY PROCEDURE REPORT  PATIENT: Laurie, Hodge  MR#: [id number] BIRTHDATE: 1960-09-25 , 52  yrs. old GENDER: Female ENDOSCOPIST: Beverley Fiedler, MD REFERRED BY:  Theora Gianotti, PA PROCEDURE DATE:  01/29/2013 PROCEDURE:  EGD w/ biopsy for H.pylori ASA CLASS:     Class II INDICATIONS:  Iron deficiency anemia.   history of GERD. MEDICATIONS: MAC sedation, administered by CRNA and Propofol (Diprivan) 80 mg IV TOPICAL ANESTHETIC: Cetacaine Spray  DESCRIPTION OF PROCEDURE: After the risks benefits and alternatives of the procedure were thoroughly explained, informed consent was obtained.  The LB [id number] endoscope was introduced through the mouth and advanced to the second portion of the duodenum. Without limitations.  The instrument was slowly withdrawn as the mucosa was fully examined.   ESOPHAGUS: The mucosa of the esophagus appeared normal.   A 2 cm hiatal hernia was noted.  STOMACH: The mucosa of the stomach appeared normal.  Biopsies were taken in the antrum and angularis.  DUODENUM: The duodenal mucosa showed no abnormalities in the bulb and second portion of the duodenum.  Retroflexed views revealed a hiatal hernia.     The scope was then withdrawn from the patient and the procedure completed.  COMPLICATIONS: There were no complications.  ENDOSCOPIC IMPRESSION: 1.   The mucosa of the esophagus appeared normal 2.   2 cm hiatal hernia 3.   The mucosa of the stomach appeared normal; biopsies were taken in the antrum and angularis 4.   The duodenal mucosa showed no abnormalities in the bulb and second portion of the duodenum  RECOMMENDATIONS: 1.  Await pathology results 2.  Continue current medications   eSigned:  Beverley Fiedler, MD 01/29/2013 4:05 PM   CC:The Patient and Theora Gianotti, PA

## 2013-01-30 ENCOUNTER — Telehealth: Payer: Self-pay | Admitting: *Deleted

## 2013-01-30 NOTE — Telephone Encounter (Signed)
  Follow up Call-  Call back number 01/29/2013  Post procedure Call Back phone  # [phone] cell  Permission to leave phone message Yes    [facility]

## 2013-02-04 ENCOUNTER — Encounter: Payer: Self-pay | Admitting: Internal Medicine

## 2013-02-28 ENCOUNTER — Telehealth: Payer: Self-pay | Admitting: *Deleted

## 2013-02-28 NOTE — Telephone Encounter (Signed)
Pt states that she decided that she will not do the nutritionist at this time and that she will call them when she is ready.  She asked if this was free because the last one that she was referred to it was $70 a visit and that was too much.  Chelle FYI.

## 2013-04-03 ENCOUNTER — Ambulatory Visit: Payer: BC Managed Care – PPO | Admitting: Physician Assistant

## 2013-04-03 ENCOUNTER — Ambulatory Visit (INDEPENDENT_AMBULATORY_CARE_PROVIDER_SITE_OTHER): Payer: BC Managed Care – PPO | Admitting: Physician Assistant

## 2013-04-03 ENCOUNTER — Encounter: Payer: Self-pay | Admitting: Physician Assistant

## 2013-04-03 VITALS — BP 132/80 | HR 64 | Temp 98.4°F | Resp 18 | Ht 65.0 in | Wt 264.0 lb

## 2013-04-03 DIAGNOSIS — I10 Essential (primary) hypertension: Secondary | ICD-10-CM

## 2013-04-03 DIAGNOSIS — G4733 Obstructive sleep apnea (adult) (pediatric): Secondary | ICD-10-CM

## 2013-04-03 DIAGNOSIS — Z23 Encounter for immunization: Secondary | ICD-10-CM

## 2013-04-03 NOTE — Progress Notes (Signed)
  Subjective:    Patient ID: Laurie Hodge, female    DOB: [birth year], 52 y.o.   MRN: [id number]  HPI  Ms Laurie Hodge is a 52 year old obese female with HTN, OSA on CPAP, and depression who is here today for a med check. She has been doing well since we saw her last. She has stopped the Wellbutrin and her mood is stable. She is no longer feeling anxious or stress. She is feeling pretty good.  Her HTN has been under good control at home with readings in the 120s/70-80s. No headaches, chest pain, palpitations, SOB, or edema.  She is not very active outside of her job. She knows that she needs to work on her diet and exercise more.   Her CPAP machine broke and so she needs a referral for a new one. She does not know what the settings were or if she needs a new titration.   Review of Systems As above    Objective:   Physical Exam  Constitutional: She is oriented to person, place, and time. Vital signs are normal. She appears well-developed and well-nourished.  obese  HENT:  Head: Normocephalic and atraumatic.  Eyes: Conjunctivae are normal. Right eye exhibits no discharge. Left eye exhibits no discharge. No scleral icterus.  Fundoscopic exam:      The right eye shows no arteriolar narrowing, no AV nicking, no exudate, no hemorrhage and no papilledema. The right eye shows red reflex.       The left eye shows no arteriolar narrowing, no AV nicking, no exudate, no hemorrhage and no papilledema. The left eye shows red reflex.  Neck: Carotid bruit is not present.  Cardiovascular: Normal rate, regular rhythm, normal heart sounds and intact distal pulses.   Pulmonary/Chest: Effort normal and breath sounds normal.  Neurological: She is alert and oriented to person, place, and time.  Skin: Skin is warm and dry.  Psychiatric: She has a normal mood and affect.   BP 132/80  Pulse 64  Temp(Src) 98.4 F (36.9 C)  Resp 18  Ht 5\' 5"  (1.651 m)  Wt 264 lb (119.75 kg)  BMI 43.93 kg/m2      Assessment & Plan:  Need for prophylactic vaccination and inoculation against influenza - Plan: Flu Vaccine QUAD 36+ mos IM  HTN (hypertension)  OSA on CPAP  Obesity, Class III, BMI 40-49.9 (morbid obesity)  Patient Instructions  Keep up the great work! Exercise, 150 minutes/week. Healthy eating choices.  If you haven't heard anything regarding the CPAP referral in 7 days, please contact me.

## 2013-04-03 NOTE — Patient Instructions (Addendum)
Keep up the great work! Exercise, 150 minutes/week. Healthy eating choices.  If you haven't heard anything regarding the CPAP referral in 7 days, please contact me.

## 2013-04-04 NOTE — Progress Notes (Signed)
I have examined this patient along with the student and agree.  Referral to [facility] to replace/repair her CPAP (previously on 7 cm humidified warmed air).  Continue previous meds: Current Outpatient Prescriptions on File Prior to Visit  Medication Sig Dispense Refill  . cetirizine (ZYRTEC) 10 MG tablet Take 10 mg by mouth daily.      . iron polysaccharides (FERREX 150) 150 MG capsule Take 1 capsule (150 mg total) by mouth 2 (two) times daily.  180 capsule  3  . lisinopril (PRINIVIL,ZESTRIL) 20 MG tablet Take 1 tablet (20 mg total) by mouth 2 (two) times daily.  180 tablet  1

## 2013-04-09 ENCOUNTER — Other Ambulatory Visit: Payer: Self-pay

## 2013-04-09 DIAGNOSIS — I10 Essential (primary) hypertension: Secondary | ICD-10-CM

## 2013-04-09 MED ORDER — LISINOPRIL 20 MG PO TABS: 20.0000 mg | ORAL_TABLET | Freq: Two times a day (BID) | ORAL | Status: DC

## 2013-04-14 ENCOUNTER — Encounter: Payer: Self-pay | Admitting: Internal Medicine

## 2013-06-10 ENCOUNTER — Encounter: Payer: Self-pay | Admitting: Physician Assistant

## 2013-06-10 DIAGNOSIS — M069 Rheumatoid arthritis, unspecified: Secondary | ICD-10-CM | POA: Insufficient documentation

## 2013-06-26 ENCOUNTER — Telehealth: Payer: Self-pay

## 2013-06-26 NOTE — Telephone Encounter (Signed)
THIS CALL WAS FROM [facility] AT [facility]. CHELLE J ORDERED PATIENT TO HAVE A C-PAP. BUT THEY NEED TO GET THE PATIENT'S  LAST ORIGINAL SLEEP STUDY THAT WAS DONE ON 09/17/2007. SHE IS NOT SURE WHERE THE PATIENT HAD IT DONE.  BEST PHONE [phone] [facility] AT [place]: [phone].  [place]

## 2013-06-26 NOTE — Telephone Encounter (Signed)
It was scanned, I have faxed it to [facility]

## 2013-06-27 ENCOUNTER — Telehealth: Payer: Self-pay

## 2013-06-27 DIAGNOSIS — G4733 Obstructive sleep apnea (adult) (pediatric): Secondary | ICD-10-CM

## 2013-06-27 NOTE — Telephone Encounter (Signed)
Last sleep study was 2009, [facility] indicates she needs new study. Have sent order. To you FYI

## 2013-06-27 NOTE — Telephone Encounter (Signed)
Patient needs referral for sleep study [phone]

## 2013-07-02 ENCOUNTER — Telehealth: Payer: Self-pay

## 2013-07-02 NOTE — Telephone Encounter (Signed)
Ivin Booty from [place] at [facility] called to request Referrals fax a copy of pt's last sleep study done in 2009. We can fax to Sharon's attn at [phone]. I'm forwarding to Referrals, but also LM on Ref VM.

## 2013-07-14 ENCOUNTER — Encounter: Payer: Self-pay | Admitting: Neurology

## 2013-07-14 ENCOUNTER — Encounter (INDEPENDENT_AMBULATORY_CARE_PROVIDER_SITE_OTHER): Payer: Self-pay

## 2013-07-14 ENCOUNTER — Ambulatory Visit (INDEPENDENT_AMBULATORY_CARE_PROVIDER_SITE_OTHER): Payer: BC Managed Care – PPO | Admitting: Neurology

## 2013-07-14 VITALS — BP 162/82 | HR 65 | Ht 65.0 in | Wt 268.0 lb

## 2013-07-14 DIAGNOSIS — G4730 Sleep apnea, unspecified: Secondary | ICD-10-CM

## 2013-07-14 DIAGNOSIS — G4733 Obstructive sleep apnea (adult) (pediatric): Secondary | ICD-10-CM

## 2013-07-14 DIAGNOSIS — G4710 Hypersomnia, unspecified: Secondary | ICD-10-CM
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
## 2013-07-14 NOTE — Patient Instructions (Addendum)
Based on your symptoms and your exam I believe you are at risk for obstructive sleep apnea or OSA, and I think we should proceed with a sleep study to determine whether you do or do not have OSA and how severe it is. If you have more than mild OSA, I want you to consider treatment with CPAP. Please remember, the risks and ramifications of moderate to severe obstructive sleep apnea or OSA are: Cardiovascular disease, including congestive heart failure, stroke, difficult to control hypertension, arrhythmias, and even type 2 diabetes has been linked to untreated OSA. Sleep apnea causes disruption of sleep and sleep deprivation in most cases, which, in turn, can cause recurrent headaches, problems with memory, mood, concentration, focus, and vigilance. Most people with untreated sleep apnea report excessive daytime sleepiness, which can affect their ability to drive. Please do not drive if you feel sleepy.  I will see you back after your sleep study to go over the test results and where to go from there. We will call you after your sleep study and to set up an appointment at the time.   Please remember to try to maintain good sleep hygiene, which means: Keep a regular sleep and wake schedule, try not to exercise or have a meal within 2 hours of your bedtime, try to keep your bedroom conducive for sleep, that is, cool and dark, without light distractors such as an illuminated alarm clock, and refrain from watching TV right before sleep or in the middle of the night and do not keep the TV or radio on during the night. Also, try not to use or play on electronic devices at bedtime, such as your cell phone, tablet PC or laptop. If you like to read at bedtime on an electronic device, try to dim the background light as much as possible. Do not eat in the middle of the night.   You may bring tylenol PM for sleep if needed.

## 2013-07-29 ENCOUNTER — Ambulatory Visit (INDEPENDENT_AMBULATORY_CARE_PROVIDER_SITE_OTHER): Payer: BC Managed Care – PPO

## 2013-07-29 DIAGNOSIS — G479 Sleep disorder, unspecified: Secondary | ICD-10-CM

## 2013-07-29 DIAGNOSIS — G4761 Periodic limb movement disorder: Secondary | ICD-10-CM

## 2013-07-29 DIAGNOSIS — G4733 Obstructive sleep apnea (adult) (pediatric): Secondary | ICD-10-CM

## 2013-07-29 DIAGNOSIS — G4710 Hypersomnia, unspecified: Secondary | ICD-10-CM

## 2013-07-29 DIAGNOSIS — G4730 Sleep apnea, unspecified: Secondary | ICD-10-CM

## 2013-08-08 ENCOUNTER — Telehealth: Payer: Self-pay | Admitting: Neurology

## 2013-08-08 ENCOUNTER — Encounter: Payer: Self-pay | Admitting: *Deleted

## 2013-08-08 DIAGNOSIS — G4733 Obstructive sleep apnea (adult) (pediatric): Secondary | ICD-10-CM

## 2013-08-08 DIAGNOSIS — G4761 Periodic limb movement disorder: Secondary | ICD-10-CM

## 2013-08-08 NOTE — Telephone Encounter (Signed)
I called and left a message for the patient about her recent sleep study results. I informed the patient that the study confirmed the diagnosis of obstructive sleep apnea and that she did well on CPAP during the night of her study. Dr. Rexene Alberts recommends CPAP therapy at home, so I will send the order to [facility] who will contact your insurance carrier to get the benefits for CPAP machine. I will fax a copy of the report to [facility] office and mail a copy of the report along with a follow instruction letter to the patient's home.

## 2013-08-08 NOTE — Telephone Encounter (Signed)
Please call and notify patient that the recent sleep study confirmed the diagnosis of OSA. She did very well with CPAP during the study with significant improvement of the respiratory events. Therefore, I would like start the patient on CPAP at home. I placed the order in the chart.   Arrange for CPAP set up at home through a DME company of patient's choice and fax/route report to PCP and referring MD (if other than PCP).   The patient will also need a follow up appointment with me in 6-8 weeks post set up that has to be scheduled; help the patient schedule this (in a follow-up slot).   Please re-enforce the importance of compliance with treatment and the need for us to monitor compliance data.   Once you have spoken to the patient and scheduled the return appointment, you may close this encounter, thanks,   Oceana Walthall, MD, PhD [facility] ([facility]    

## 2013-08-24 ENCOUNTER — Other Ambulatory Visit: Payer: Self-pay | Admitting: Physician Assistant

## 2013-08-25 ENCOUNTER — Encounter: Payer: Self-pay | Admitting: Neurology

## 2013-09-19 ENCOUNTER — Encounter: Payer: Self-pay | Admitting: Neurology

## 2013-10-02 ENCOUNTER — Ambulatory Visit (INDEPENDENT_AMBULATORY_CARE_PROVIDER_SITE_OTHER): Payer: BC Managed Care – PPO | Admitting: Physician Assistant

## 2013-10-02 ENCOUNTER — Ambulatory Visit: Payer: BC Managed Care – PPO

## 2013-10-02 ENCOUNTER — Encounter: Payer: Self-pay | Admitting: Physician Assistant

## 2013-10-02 VITALS — BP 135/67 | HR 63 | Temp 98.0°F | Resp 16 | Ht 65.5 in | Wt 275.0 lb

## 2013-10-02 DIAGNOSIS — R0602 Shortness of breath: Secondary | ICD-10-CM

## 2013-10-02 DIAGNOSIS — R600 Localized edema: Secondary | ICD-10-CM

## 2013-10-02 DIAGNOSIS — E785 Hyperlipidemia, unspecified: Secondary | ICD-10-CM

## 2013-10-02 DIAGNOSIS — K219 Gastro-esophageal reflux disease without esophagitis: Secondary | ICD-10-CM

## 2013-10-02 DIAGNOSIS — R609 Edema, unspecified: Secondary | ICD-10-CM

## 2013-10-02 DIAGNOSIS — I10 Essential (primary) hypertension: Secondary | ICD-10-CM

## 2013-10-02 DIAGNOSIS — D649 Anemia, unspecified: Secondary | ICD-10-CM

## 2013-10-02 LAB — COMPLETE METABOLIC PANEL WITH GFR
ALBUMIN: 3.9 g/dL (ref 3.5–5.2)
ALT: 19 U/L (ref 0–35)
AST: 15 U/L (ref 0–37)
Alkaline Phosphatase: 65 U/L (ref 39–117)
BUN: 9 mg/dL (ref 6–23)
CALCIUM: 9.1 mg/dL (ref 8.4–10.5)
CHLORIDE: 106 meq/L (ref 96–112)
CO2: 27 mEq/L (ref 19–32)
CREATININE: 0.64 mg/dL (ref 0.50–1.10)
GLUCOSE: 76 mg/dL (ref 70–99)
POTASSIUM: 4.1 meq/L (ref 3.5–5.3)
Sodium: 141 mEq/L (ref 135–145)
Total Bilirubin: 0.5 mg/dL (ref 0.2–1.2)
Total Protein: 7.1 g/dL (ref 6.0–8.3)

## 2013-10-02 LAB — CBC WITH DIFFERENTIAL/PLATELET
BASOS ABS: 0.1 10*3/uL (ref 0.0–0.1)
Basophils Relative: 1 % (ref 0–1)
EOS PCT: 2 % (ref 0–5)
Eosinophils Absolute: 0.1 10*3/uL (ref 0.0–0.7)
HCT: 28.7 % — ABNORMAL LOW (ref 36.0–46.0)
Hemoglobin: 9.2 g/dL — ABNORMAL LOW (ref 12.0–15.0)
LYMPHS PCT: 30 % (ref 12–46)
Lymphs Abs: 1.8 10*3/uL (ref 0.7–4.0)
MCH: 24.9 pg — ABNORMAL LOW (ref 26.0–34.0)
MCHC: 32.1 g/dL (ref 30.0–36.0)
MCV: 77.6 fL — AB (ref 78.0–100.0)
MONO ABS: 0.6 10*3/uL (ref 0.1–1.0)
Monocytes Relative: 10 % (ref 3–12)
NEUTROS ABS: 3.4 10*3/uL (ref 1.7–7.7)
Neutrophils Relative %: 57 % (ref 43–77)
Platelets: 450 10*3/uL — ABNORMAL HIGH (ref 150–400)
RBC: 3.7 MIL/uL — ABNORMAL LOW (ref 3.87–5.11)
RDW: 16.4 % — AB (ref 11.5–15.5)
WBC: 5.9 10*3/uL (ref 4.0–10.5)

## 2013-10-02 LAB — TSH: TSH: 1.551 u[IU]/mL (ref 0.350–4.500)

## 2013-10-02 MED ORDER — POLYSACCHARIDE IRON COMPLEX 150 MG PO CAPS: ORAL_CAPSULE | ORAL | Status: DC

## 2013-10-02 MED ORDER — LISINOPRIL 20 MG PO TABS: 20.0000 mg | ORAL_TABLET | Freq: Two times a day (BID) | ORAL | Status: DC

## 2013-10-02 MED ORDER — PRAVASTATIN SODIUM 20 MG PO TABS: 20.0000 mg | ORAL_TABLET | Freq: Every day | ORAL | Status: DC

## 2013-10-02 MED ORDER — OMEPRAZOLE 40 MG PO CPDR: 40.0000 mg | DELAYED_RELEASE_CAPSULE | Freq: Every day | ORAL | Status: DC

## 2013-10-02 MED ORDER — HYDROCHLOROTHIAZIDE 12.5 MG PO CAPS: 12.5000 mg | ORAL_CAPSULE | Freq: Every day | ORAL | Status: DC

## 2013-10-02 NOTE — Progress Notes (Signed)
I have examined this patient along with the student and agree.  

## 2013-10-02 NOTE — Progress Notes (Signed)
Subjective:    Patient ID: Laurie Hodge, female    DOB: 05/16/1961, 53 y.o.   MRN: [id number]  HPI   52y.o female with hx of GERD, HTN, Iron def anemia, obesity, hyperlipidemia, and RA presents in need of refill for her ferrex and lisinopril.  Pt also would like to be placed on medication to control her cholesterol.  She was tried on pravastatin in 12/2011, however she d/c because she was having generalized muscle and joint pain and thought it was due to the statin.  She has since been diagnosed with RA and has not tried pravastatin since being treated for RA with methotrexate.    Pt also reports swelling in lower legs bilat on and off for about a month.  Swelling is worse in the evening and worse on days with more walking than usual.  For last three months she has had progressive SOB with exertion.  Now she is having SOB with activites around the house which never bothered her before the last 3 months.  She now needs to sit down and rest while cleaning her house or walking up a single flight of stairs.  Pt sleeps with 3-4 pillows but this has not changed in past 3 months.  She denies PND, hx of CHF, chest pain, palpitations.  Pt also experiencing more heart burn than usual made worse with laying down, onions, greasy foods. Relieved with belching  Now feeling faint burning sensation without triggers or laying down.  She has been taking her mother's omeprazole prn for worsening heart burn at night.  She would like to be prescribed the same medication to control her symptoms.  Review of Systems  Constitutional: Negative for fever, chills and fatigue.  HENT: Positive for congestion.   Eyes: Negative.   Respiratory: Negative.   Cardiovascular: Positive for leg swelling. Negative for chest pain and palpitations.  Gastrointestinal: Negative.   Endocrine: Negative.   Genitourinary: Negative.   Musculoskeletal: Negative.   Skin: Negative for color change and rash.  Allergic/Immunologic: Positive  for environmental allergies.  Neurological: Negative.   Hematological: Negative.   Psychiatric/Behavioral: Negative.        Objective:   Physical Exam  Constitutional: She is oriented to person, place, and time. She appears well-developed and well-nourished. No distress.  BP 135/67  Pulse 63  Temp(Src) 98 F (36.7 C)  Resp 16  Ht 5' 5.5" (1.664 m)  Wt 275 lb (124.739 kg)  BMI 45.05 kg/m2  SpO2 97%  LMP 09/09/2013   HENT:  Head: Normocephalic.  Eyes: Conjunctivae are normal. Pupils are equal, round, and reactive to light.  Neck: Normal range of motion. JVD: unable to visualize neck veins. No tracheal deviation present. No thyromegaly present.  Cardiovascular: Normal rate, regular rhythm, normal heart sounds and intact distal pulses.  Exam reveals no gallop and no friction rub.   No murmur heard. Pulmonary/Chest: Effort normal and breath sounds normal. No respiratory distress. She has no wheezes. She has no rales.  Abdominal: Soft. Bowel sounds are normal. She exhibits no distension and no mass. There is no tenderness.  Musculoskeletal: Normal range of motion. She exhibits no edema (1+ pitting edema bilat anterior leg just below knees).  Neurological: She is alert and oriented to person, place, and time. She displays normal reflexes. No cranial nerve deficit. Coordination normal.  Skin: Skin is warm and dry. No rash noted.  Psychiatric: She has a normal mood and affect. Her behavior is normal.    CXR reviewed  by Dr. Leward Quan: Cardiomegaly; possible blunting of R costophrenic angle; Possible vascular congestion; no infiltrates or masses  EKG reviewed by Dr. Leward Quan: NSR      Assessment & Plan:   1. SOB (shortness of breath) 2. Bilateral leg edema Progressing SOB with normal activity and peripheral edema last 1 month.  Will have outpt echocardiogram. - EKG 12-Lead - CBC with Differential - COMPLETE METABOLIC PANEL WITH GFR - TSH - DG Chest 2 View; Future - 2D  Echocardiogram with contrast; Future - hydrochlorothiazide (MICROZIDE) 12.5 MG capsule; Take 1 capsule (12.5 mg total) by mouth daily.  Dispense: 30 capsule; Refill: 1  3. HTN (hypertension) Refilled med.  Added HCTZ for peripheral edema. - lisinopril (PRINIVIL,ZESTRIL) 20 MG tablet; Take 1 tablet (20 mg total) by mouth 2 (two) times daily.  Dispense: 180 tablet; Refill: 1  4. Anemia Refilled med.  Continue current treatment plan. - iron polysaccharides (FERREX 150) 150 MG capsule; TAKE ONE CAPSULE BY MOUTH TWICE DAILY  Dispense: 180 capsule; Refill: 3  5. Hyperlipidemia Tried pravastatin in 12/2011.  D/c due to muscle and joint aches.  Has since been diagnosed with RA and treated with methotrexate.  Will re-try pravastatin and monitor for muscle ache ADR. - pravastatin (PRAVACHOL) 20 MG tablet; Take 1 tablet (20 mg total) by mouth daily.  Dispense: 90 tablet; Refill: 3  6. GERD (gastroesophageal reflux disease) Prescribed omeprazole for worsening reflux symptoms. - omeprazole (PRILOSEC) 40 MG capsule; Take 1 capsule (40 mg total) by mouth daily.  Dispense: 90 capsule; Refill: 3

## 2013-10-04 NOTE — Progress Notes (Signed)
CXR impression: Cardiomegaly; no vascular congestion; no infiltrates or masses.

## 2013-10-06 ENCOUNTER — Encounter: Payer: Self-pay | Admitting: Physician Assistant

## 2013-10-08 NOTE — Addendum Note (Signed)
Addended by: Orion Crook on: 10/08/2013 03:47 PM   Modules accepted: Orders

## 2013-10-13 ENCOUNTER — Encounter: Payer: Self-pay | Admitting: Neurology

## 2013-10-13 ENCOUNTER — Ambulatory Visit (INDEPENDENT_AMBULATORY_CARE_PROVIDER_SITE_OTHER): Payer: BC Managed Care – PPO | Admitting: Neurology

## 2013-10-13 ENCOUNTER — Other Ambulatory Visit (INDEPENDENT_AMBULATORY_CARE_PROVIDER_SITE_OTHER): Payer: BC Managed Care – PPO | Admitting: Family Medicine

## 2013-10-13 VITALS — BP 132/75 | HR 64 | Temp 98.9°F | Ht 65.5 in | Wt 272.0 lb

## 2013-10-13 DIAGNOSIS — R0602 Shortness of breath: Secondary | ICD-10-CM

## 2013-10-13 DIAGNOSIS — G4733 Obstructive sleep apnea (adult) (pediatric): Secondary | ICD-10-CM

## 2013-10-13 DIAGNOSIS — D649 Anemia, unspecified: Secondary | ICD-10-CM

## 2013-10-13 DIAGNOSIS — G4761 Periodic limb movement disorder: Secondary | ICD-10-CM

## 2013-10-13 DIAGNOSIS — E669 Obesity, unspecified: Secondary | ICD-10-CM

## 2013-10-13 LAB — RETICULOCYTES
ABS Retic: 44.9 10*3/uL (ref 19.0–186.0)
RBC.: 4.08 MIL/uL (ref 3.87–5.11)
Retic Ct Pct: 1.1 % (ref 0.4–2.3)

## 2013-10-13 NOTE — Progress Notes (Signed)
Subjective:    Patient ID: Laurie Hodge is a 53 y.o. female.  HPI    Interim history:   Laurie Hodge is a 53 year old right-handed woman with an underlying medical history of rheumatoid arthritis, obesity, reflux disease, hypertension, vitamin D deficiency as well as iron deficiency anemia and history of HSV-2 infection, who presents for followup consultation of her sleep apnea. She is unaccompanied today. I first met her on 07/14/2013, at which time she reported that her CPAP machine had broken. She original sleep study was from 2009. I asked her to return for reevaluation and treatment. She had a split-night sleep study on 07/29/2013 and went over her test results with her in detail today. Her baseline sleep efficiency was 63.1% with a latency to sleep of 60 minutes and wake after sleep onset of 18.5 minutes with mild sleep fragmentation noted. She had a normal arousal index. She had an increased percentage of stage I sleep, an increased percentage of slow-wave sleep and a reduced percentage of REM sleep with a prolonged REM latency. She had mild periodic leg movements of sleep with no arousals associated. She had mild to moderate snoring. Her total AHI was high at 45.2 per hour. Baseline oxygen saturation was 94% with a nadir of 82%. She was then titrated on CPAP, pressure of 5-11 cm. On the final pressure her AHI was 0 per hour. Supine REM sleep was achieved. She slept 92.2% of the time during the second part of the study. She had a increased percentage of stage II sleep, near absence of slow-wave sleep, and a normal percentage of REM sleep. Average oxygen saturation was 95% with a nadir of 89%. Snoring was eliminated. Based on the test results I prescribed CPAP for her. I reviewed her compliance data from 08/25/2013 through 09/15/2013 which is a total of 22 days during which time she uses CPAP every night. Average usage for all days was 7 hours and 21 minutes, percent used days greater than 4 hours  was 100%, indicating superb compliance. Residual AHI was acceptable at 1.9 per hour and leak was acceptable at 17.4 L per minute at the 95th percentile.  Today, I reviewed her compliance data from 09/02/2013 through 10/12/2013 which is the last 41 days during which time she was CPAP every night. Percent used days greater than 4 hours was 98%, showing excellent compliance. Average usage was 7 hours and 56 minutes, residual AHI was 1.8 per hour, 95th percentile of leak was 26.4 L per minute, slightly high.  Today, she reports sleeping much better, EDS is much better, but she still has some fatigue and lately has had more SOBOE with some leg swelling noted a month ago. She has been placed on low dose HCTZ and pravachol and she has an appointment with cardiology on 10/22/13.   I reviewed her sleep study interpretation report which was performed at the [facility] on 09/17/2007: Her sleep efficiency was 68.6%, REM latency was 247 minutes. Her overall AHI was 7.23 per hour, rising to 28.09 per hour and REM sleep. Her lowest oxygen saturation was 92%. She was found to snore loudly. She was 255 lb at the time of her previous sleep study.  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 She does not smoke and drinks alcohol rarely.  She drinks an energy drink as needed, about once a week and is not much of a caffeine drinker.    Her Past Medical History Is Significant  For: Past Medical History  Diagnosis Date  . Obesity, Class III, BMI 40-49.9 (morbid obesity)   . GERD (gastroesophageal reflux disease)   . OSA on CPAP   . HTN (hypertension)   . HSV-2 infection     IgG  . Vitamin D insufficiency   . Hemorrhoid   . Iron (Fe) deficiency anemia   . Hyperlipidemia   . Allergy   . Depression   . Ulcer     gastric - h. pylori positive  . Arthritis     RA    Her Past Surgical History Is Significant For: Past Surgical History  Procedure Laterality Date  . Tubal  ligation      Her Family History Is Significant For: Family History  Problem Relation Age of Onset  . Diabetes Mother   . Hypertension Mother   . Dementia Mother   . Cancer Paternal Aunt     breast ca x 5 maternal aunts  . Diabetes Sister   . Colon cancer Neg Hx   . Esophageal cancer Neg Hx   . Rectal cancer Neg Hx   . Stomach cancer Neg Hx     Her Social History Is Significant For: History   Social History  . Marital Status: Divorced    Spouse Name: N/A    Number of Children: 3  . Years of Education: 12   Occupational History  . [employer]  .  [place] History Main Topics  . Smoking status: Never Smoker   . Smokeless tobacco: Never Used  . Alcohol Use: Yes     Comment: occasionally  . Drug Use: No  . Sexual Activity: Not Currently    Partners: Male     Comment: not active since divorce from husband 2010   Other Topics Concern  . None   Social History Narrative   Lives alone. She and her siblings are taking turns staying with their mother, who has dementia and had a stroke.Marland Kitchen      Her Allergies Are:  Allergies  Allergen Reactions  . Adhesive [Tape] Rash    Skin Peels  :   Her Current Medications Are:  Outpatient Encounter Prescriptions as of 10/13/2013  Medication Sig  . cetirizine (ZYRTEC) 10 MG tablet Take 10 mg by mouth daily.  . cholecalciferol (VITAMIN D) 1000 UNITS tablet Take 1,000 Units by mouth daily.  Marland Kitchen docusate sodium (COLACE) 50 MG capsule Take by mouth 2 (two) times daily as needed for constipation.  . fish oil-omega-3 fatty acids 1000 MG capsule Take 2 g by mouth daily.  . folic acid (FOLVITE) 1 MG tablet Take 1 tablet by mouth daily.  . hydrochlorothiazide (MICROZIDE) 12.5 MG capsule Take 1 capsule (12.5 mg total) by mouth daily.  . iron polysaccharides (FERREX 150) 150 MG capsule TAKE ONE CAPSULE BY MOUTH TWICE DAILY  . lisinopril (PRINIVIL,ZESTRIL) 20 MG tablet Take 1 tablet (20 mg  total) by mouth 2 (two) times daily.  . methotrexate (RHEUMATREX) 2.5 MG tablet Take 2.5 tablets by mouth once a week. Take 6 tabs once a week  . omeprazole (PRILOSEC) 40 MG capsule Take 1 capsule (40 mg total) by mouth daily.  . pravastatin (PRAVACHOL) 20 MG tablet Take 1 tablet (20 mg total) by mouth daily.  :  Review of Systems:  Out of a complete 14 point review of systems, all are reviewed and negative with the exception of these symptoms as listed below:  Review of Systems  Constitutional: Negative.   HENT: Negative.   Eyes: Negative.   Respiratory: Positive for cough and shortness of breath.   Cardiovascular: Positive for leg swelling.  Gastrointestinal: Positive for constipation and abdominal distention.  Endocrine: Positive for polyphagia.  Genitourinary: Negative.   Musculoskeletal: Positive for arthralgias and back pain.  Skin: Negative.   Allergic/Immunologic: Negative.   Neurological: Negative.   Hematological:       Anemia  Psychiatric/Behavioral: Positive for agitation.    Objective:  Neurologic Exam  Physical Exam Physical Examination:   Filed Vitals:   10/13/13 1329  BP: 132/75  Pulse: 64  Temp: 98.9 F (37.2 C)    General Examination: The patient is a very pleasant 53 y.o. female in no acute distress. She appears well-developed and well-nourished and well groomed. She is obese.   HEENT: Normocephalic, atraumatic, pupils are equal, round and reactive to light and accommodation. Funduscopic exam is normal with sharp disc margins noted. Extraocular tracking is good without limitation to gaze excursion or nystagmus noted. Normal smooth pursuit is noted. Hearing is grossly intact. Face is symmetric with normal facial animation and normal facial sensation. Speech is clear with no dysarthria noted. There is no hypophonia. There is no lip, neck/head, jaw or voice tremor. Neck is supple with full range of passive and active motion. There are no carotid bruits on  auscultation. Oropharynx exam reveals: moderate mouth dryness, adequate dental hygiene and moderate airway crowding, due to large tongue and smaller airway entry. Mallampati is class II. Tongue protrudes centrally and palate elevates symmetrically. Tonsils are 1+ in size.    Chest: Clear to auscultation without wheezing, rhonchi or crackles noted.  Heart: S1+S2+0, regular and normal without murmurs, rubs or gallops noted.   Abdomen: Soft, non-tender and non-distended with normal bowel sounds appreciated on auscultation.  Extremities: There is no significant edema in the distal lower extremities bilaterally. Pedal pulses are intact.  Skin: Warm and dry without trophic changes noted. There are no varicose veins.  Musculoskeletal: exam reveals no obvious joint deformities, tenderness or joint swelling or erythema.   Neurologically:  Mental status: The patient is awake, alert and oriented in all 4 spheres. Her memory, attention, language and knowledge are appropriate. There is no aphasia, agnosia, apraxia or anomia. Speech is clear with normal prosody and enunciation. Thought process is linear. Mood is congruent and affect is normal.  Cranial nerves are as described above under HEENT exam. In addition, shoulder shrug is normal with equal shoulder height noted. Motor exam: Normal bulk, strength and tone is noted. There is no drift, tremor or rebound. Romberg is negative. Reflexes are 2+ throughout. Toes are downgoing bilaterally. Fine motor skills are intact with normal finger taps, normal hand movements, normal rapid alternating patting, normal foot taps and normal foot agility.  Cerebellar testing shows no dysmetria or intention tremor on finger to nose testing. Heel to shin is unremarkable bilaterally. There is no truncal or gait ataxia.  Sensory exam is intact to light touch, pinprick, vibration, temperature sense in the upper and lower extremities.  Gait, station and balance are unremarkable. No  veering to one side is noted. No leaning to one side is noted. Posture is age-appropriate and stance is narrow based. No problems turning are noted. She turns en bloc. Tandem walk is unremarkable. Intact toe and heel stance is noted.               Assessment and Plan:   In summary, JASMEET MANTON is a very  pleasant 53 y.o.-year old female with an underlying medical history of rheumatoid arthritis, obesity, reflux disease, hypertension, vitamin D deficiency as well as iron deficiency anemia and history of HSV-2 infection, who returns after a recent split night study, which confirmed OSA, severe by number of events. She is now established back on CPAP of 11 cm of water pressure with good results. She endorses improvement of her daytime somnolence and nighttime sleep consolidation. She does have some daytime tiredness and has reported shortness of breath on exertion. On exam she is nonfocal and stable and I reassured her in that regard. She has recently been placed on hydrochlorothiazide and Pravachol which I encouraged her to continue. She has an appointment for an echocardiogram on 10/22/2013. She is advised about her sleep test results and we discussed the numbers in detail. She's also advised about her compliance data and I explained the numbers to her as well. She is congratulated on her great treatment adherence and encouraged to continue using CPAP regularly to help reduce her cardiovascular risks. As she is doing well from my end of things I suggested she return for followup routinely in 6 months from now and yearly thereafter if things continue to do well. She is again advised regarding maintaining a healthy lifestyle in general. I encouraged the patient to eat healthy, exercise daily and keep well hydrated, to keep a scheduled bedtime and wake time routine, to not skip any meals and eat healthy snacks in between meals and to have protein with every meal. I stressed the importance of regular exercise.   I  answered all her questions today and the patient was in agreement with the above outlined plan.

## 2013-10-13 NOTE — Patient Instructions (Signed)

## 2013-10-14 LAB — PATHOLOGIST SMEAR REVIEW

## 2013-10-22 ENCOUNTER — Ambulatory Visit (HOSPITAL_COMMUNITY): Payer: BC Managed Care – PPO | Attending: Physician Assistant | Admitting: Radiology

## 2013-10-22 ENCOUNTER — Inpatient Hospital Stay (HOSPITAL_COMMUNITY)
Admission: RE | Admit: 2013-10-22 | Payer: BC Managed Care – PPO | Source: Intra-hospital | Admitting: Physical Medicine & Rehabilitation

## 2013-10-22 DIAGNOSIS — I10 Essential (primary) hypertension: Secondary | ICD-10-CM | POA: Insufficient documentation

## 2013-10-22 DIAGNOSIS — R609 Edema, unspecified: Secondary | ICD-10-CM | POA: Insufficient documentation

## 2013-10-22 DIAGNOSIS — R0602 Shortness of breath: Secondary | ICD-10-CM | POA: Insufficient documentation

## 2013-10-22 DIAGNOSIS — I509 Heart failure, unspecified: Secondary | ICD-10-CM | POA: Insufficient documentation

## 2013-10-22 DIAGNOSIS — I517 Cardiomegaly: Secondary | ICD-10-CM | POA: Insufficient documentation

## 2013-10-22 DIAGNOSIS — E785 Hyperlipidemia, unspecified: Secondary | ICD-10-CM | POA: Insufficient documentation

## 2013-10-22 NOTE — Progress Notes (Signed)
Echocardiogram performed.  

## 2013-11-03 ENCOUNTER — Encounter: Payer: Self-pay | Admitting: Neurology

## 2013-11-06 ENCOUNTER — Ambulatory Visit (INDEPENDENT_AMBULATORY_CARE_PROVIDER_SITE_OTHER): Payer: BC Managed Care – PPO | Admitting: Physician Assistant

## 2013-11-06 ENCOUNTER — Encounter: Payer: Self-pay | Admitting: Physician Assistant

## 2013-11-06 VITALS — BP 136/69 | HR 71 | Temp 98.9°F | Resp 16 | Ht 65.5 in | Wt 275.0 lb

## 2013-11-06 DIAGNOSIS — G4733 Obstructive sleep apnea (adult) (pediatric): Secondary | ICD-10-CM

## 2013-11-06 DIAGNOSIS — E785 Hyperlipidemia, unspecified: Secondary | ICD-10-CM

## 2013-11-06 DIAGNOSIS — D509 Iron deficiency anemia, unspecified: Secondary | ICD-10-CM

## 2013-11-06 DIAGNOSIS — R5381 Other malaise: Secondary | ICD-10-CM

## 2013-11-06 DIAGNOSIS — E66813 Obesity, class 3: Secondary | ICD-10-CM

## 2013-11-06 DIAGNOSIS — I10 Essential (primary) hypertension: Secondary | ICD-10-CM

## 2013-11-06 DIAGNOSIS — Z9989 Dependence on other enabling machines and devices: Secondary | ICD-10-CM

## 2013-11-06 DIAGNOSIS — R5383 Other fatigue: Secondary | ICD-10-CM

## 2013-11-06 NOTE — Progress Notes (Signed)
Subjective:    Patient ID: Laurie Hodge, female    DOB: 06/17/1961, 53 y.o.   MRN: [id number]   PCP: Lilleigh Hechavarria, PA-C  Chief Complaint  Patient presents with  . Follow-up    1 month, med review   Medications, allergies, past medical history, surgical history, family history, social history and problem list reviewed and updated.  Patient Active Problem List   Diagnosis Date Noted  . Hyperlipidemia 11/06/2013  . Rheumatoid arthritis 06/10/2013  . Obesity, Class III, BMI 40-49.9 (morbid obesity)   . GERD (gastroesophageal reflux disease)   . OSA on CPAP   . HTN (hypertension)   . HSV-2 infection   . Vitamin D insufficiency   . Iron (Fe) deficiency anemia     Prior to Admission medications   Medication Sig Start Date End Date Taking? Authorizing Provider  cetirizine (ZYRTEC) 10 MG tablet Take 10 mg by mouth daily.   Yes Historical Provider, MD  cholecalciferol (VITAMIN D) 1000 UNITS tablet Take 1,000 Units by mouth daily.   Yes Historical Provider, MD  docusate sodium (COLACE) 50 MG capsule Take by mouth 2 (two) times daily as needed for constipation.   Yes Historical Provider, MD  fish oil-omega-3 fatty acids 1000 MG capsule Take 2 g by mouth daily.   Yes Historical Provider, MD  folic acid (FOLVITE) 1 MG tablet Take 1 tablet by mouth daily. 06/02/13  Yes Historical Provider, MD  hydrochlorothiazide (MICROZIDE) 12.5 MG capsule Take 1 capsule (12.5 mg total) by mouth daily. 10/02/13  Yes Bo Rogue S Myran Arcia, PA-C  iron polysaccharides (FERREX 150) 150 MG capsule TAKE ONE CAPSULE BY MOUTH TWICE DAILY 10/02/13  Yes Tiarra Anastacio S Dillard Pascal, PA-C  lisinopril (PRINIVIL,ZESTRIL) 20 MG tablet Take 1 tablet (20 mg total) by mouth 2 (two) times daily. 10/02/13  Yes Amayia Ciano S Toretto Tingler, PA-C  methotrexate (RHEUMATREX) 2.5 MG tablet Take 2.5 tablets by mouth once a week. Take 6 tabs once a week 06/26/13  Yes Historical Provider, MD  omeprazole (PRILOSEC) 40 MG capsule Take 1 capsule (40 mg total) by  mouth daily. 10/02/13  Yes Juel Ripley S Mita Vallo, PA-C  pravastatin (PRAVACHOL) 20 MG tablet Take 1 tablet (20 mg total) by mouth daily. 10/02/13  Yes Maleyah Evans S Norvell Caswell, PA-C    HPI  ECHO was essentially normal. Peripheral smear suggested Fe deficiency anemia, which is known (colonoscopy normal 2014). Tolerating pravastatin on re-try, suspect that previous pain was due to uncontrolled RA. Tolerating addition of diuretic.  Less LE edema. SOB is better, with improved GERD control on omeprazole. Now on CPAP for OSA. Fatigue is improving, but not resolved. Working with Rheumatology to get better control of RA.  Review of Systems As above.    Objective:   Physical Exam  Vitals reviewed. Constitutional: She is oriented to person, place, and time. Vital signs are normal. She appears well-developed and well-nourished. She is active and cooperative. No distress.  BP 136/69  Pulse 71  Temp(Src) 98.9 F (37.2 C)  Resp 16  Ht 5' 5.5" (1.664 m)  Wt 275 lb (124.739 kg)  BMI 45.05 kg/m2  SpO2 98%  LMP 10/11/2013  HENT:  Head: Normocephalic and atraumatic.  Right Ear: Hearing normal.  Left Ear: Hearing normal.  Eyes: Conjunctivae are normal. No scleral icterus.  Neck: Normal range of motion. Neck supple. No thyromegaly present.  Cardiovascular: Normal rate, regular rhythm and normal heart sounds.   Pulses:      Radial pulses are 2+ on the right side, and 2+  on the left side.  Trace edema BILATERAL anterior tibias just above the ankles.  Pulmonary/Chest: Effort normal and breath sounds normal.  Lymphadenopathy:       Head (right side): No tonsillar, no preauricular, no posterior auricular and no occipital adenopathy present.       Head (left side): No tonsillar, no preauricular, no posterior auricular and no occipital adenopathy present.    She has no cervical adenopathy.       Right: No supraclavicular adenopathy present.       Left: No supraclavicular adenopathy present.  Neurological: She  is alert and oriented to person, place, and time. No sensory deficit.  Skin: Skin is warm, dry and intact. No rash noted. No cyanosis or erythema. Nails show no clubbing.  Psychiatric: She has a normal mood and affect.          Assessment & Plan:  1. Fatigue Improved with CPAP, but not resolved.  May continue to improve.  May also see improvement with upcoming change from methotrexate to alternate RA treatment.  2. HTN (hypertension) Controlled. Continue on current ACEI and diuretic combination.  3. Iron (Fe) deficiency anemia Continue iron supplement.  Continue to monitor.  4. Hyperlipidemia Continue pravastatin. Healthy eating, regular exercise.  Encouraged her to discuss recommended exercise with rheumatology-suspect swimming will be best option.  5. Obesity, Class III, BMI 40-49.9 (morbid obesity) See above.  6. OSA on CPAP Continue per Dr. Rexene Alberts.   Return in about 6 months (around 05/09/2014) for re-evaluation of blood pressure, anemia, and cholesterol.    Fara Chute, PA-C Physician Assistant-Certified [facility]

## 2013-11-06 NOTE — Patient Instructions (Signed)
Keep up the great work! Talk with the rheumatologist about exercise that is appropriate for you.

## 2013-11-29 ENCOUNTER — Other Ambulatory Visit: Payer: Self-pay | Admitting: Physician Assistant

## 2014-01-06 LAB — HM MAMMOGRAPHY: HM Mammogram: NEGATIVE

## 2014-01-09 ENCOUNTER — Encounter: Payer: Self-pay | Admitting: Physician Assistant

## 2014-04-14 ENCOUNTER — Encounter: Payer: Self-pay | Admitting: Neurology

## 2014-04-14 ENCOUNTER — Ambulatory Visit (INDEPENDENT_AMBULATORY_CARE_PROVIDER_SITE_OTHER): Payer: BC Managed Care – PPO | Admitting: Neurology

## 2014-04-14 VITALS — BP 158/77 | HR 62 | Temp 98.3°F | Ht 65.5 in | Wt 272.0 lb

## 2014-04-14 DIAGNOSIS — E669 Obesity, unspecified: Secondary | ICD-10-CM

## 2014-04-14 DIAGNOSIS — G4733 Obstructive sleep apnea (adult) (pediatric): Secondary | ICD-10-CM

## 2014-04-14 DIAGNOSIS — Z9989 Dependence on other enabling machines and devices: Principal | ICD-10-CM

## 2014-04-14 NOTE — Patient Instructions (Signed)
Please continue using your CPAP regularly. While your insurance requires that you use CPAP at least 4 hours each night on 70% of the nights, I recommend, that you not skip any nights and use it throughout the night if you can. Getting used to CPAP and staying with the treatment long term does take time and patience and discipline. Untreated obstructive sleep apnea when it is moderate to severe can have an adverse impact on cardiovascular health and raise her risk for heart disease, arrhythmias, hypertension, congestive heart failure, stroke and diabetes. Untreated obstructive sleep apnea causes sleep disruption, nonrestorative sleep, and sleep deprivation. This can have an impact on your day to day functioning and cause daytime sleepiness and impairment of cognitive function, memory loss, mood disturbance, and problems focussing. Using CPAP regularly can improve these symptoms.  Keep up the good work! I will see you back in 12 months for sleep apnea check up. You do need new supplies, including a new mask, which I ordered today.

## 2014-04-14 NOTE — Progress Notes (Signed)
Subjective:    Patient ID: Laurie Hodge is a 53 y.o. female.  HPI    Interim history:   Laurie Hodge is a 53 year old right-handed woman with an underlying medical history of rheumatoid arthritis, obesity, reflux disease, hypertension, vitamin D deficiency as well as iron deficiency anemia and history of HSV-2 infection, who presents for followup consultation of her sleep apnea, treated with CPAP therapy. She is unaccompanied today. I last saw her on 10/13/2013, at which time she reported sleeping much better, EDS was much better, but she still had some fatigue and lately more SOBOE with some leg swelling noted. She had been placed on low dose HCTZ and pravachol and was going to see cardiology. She had an echocardiogram on 10/22/2013 which was reported as normal. Today, I reviewed her compliance data from 03/15/2014 through 04/13/2014 which is a total of 30 days during which time she used her machine every night except for 1 night. Percent used days greater than 4 hours was 90%, indicating excellent compliance with an average usage of 7 hours. Pressure at 11 cm with EPR 1. Residual AHI at 2.3 per hour indicating an adequate pressure setting. Leak was high at times. 95th percentile of leak was 39.8 L/m which is too high. Today, she reports that she has not had a new mask since she first started treatment. This may explain the higher leak. She is compliant with treatment but skipped a day recently because she was congested. Overall she continues to endorse that she has been doing well with treatment. She has recently been switched from methotrexate to Humira for her rheumatoid arthritis because of fatigue reported with methotrexate. She has been on the Humira for 1 month at this time. I first met her on 07/14/2013, at which time she reported that her CPAP machine had broken. She original sleep study was from 2009. I asked her to return for reevaluation and treatment. She had a split-night sleep study on  07/29/2013. Her baseline sleep efficiency was 63.1% with a latency to sleep of 60 minutes and wake after sleep onset of 18.5 minutes with mild sleep fragmentation noted. She had a normal arousal index. She had an increased percentage of stage I sleep, an increased percentage of slow-wave sleep and a reduced percentage of REM sleep with a prolonged REM latency. She had mild periodic leg movements of sleep with no arousals associated. She had mild to moderate snoring. Her total AHI was high at 45.2 per hour. Baseline oxygen saturation was 94% with a nadir of 82%. She was then titrated on CPAP, pressure of 5-11 cm. On the final pressure her AHI was 0 per hour. Supine REM sleep was achieved. She slept 92.2% of the time during the second part of the study. She had a increased percentage of stage II sleep, near absence of slow-wave sleep, and a normal percentage of REM sleep. Average oxygen saturation was 95% with a nadir of 89%. Snoring was eliminated. Based on the test results I prescribed CPAP for her.  I reviewed her compliance data from 08/25/2013 through 09/15/2013 which is a total of 22 days during which time she uses CPAP every night. Average usage for all days was 7 hours and 21 minutes, percent used days greater than 4 hours was 100%, indicating superb compliance. Residual AHI was acceptable at 1.9 per hour and leak was acceptable at 17.4 L per minute at the 95th percentile.  I reviewed her compliance data from 09/02/2013 through 10/12/2013 which is the last  41 days during which time she was CPAP every night. Percent used days greater than 4 hours was 98%, showing excellent compliance. Average usage was 7 hours and 56 minutes, residual AHI was 1.8 per hour, 95th percentile of leak was 26.4 L per minute, slightly high.  I reviewed her sleep study interpretation report which was performed at the [facility] on 09/17/2007: Her sleep efficiency was 68.6%, REM latency was 247 minutes. Her  overall AHI was 7.23 per hour, rising to 28.09 per hour and REM sleep. Her lowest oxygen saturation was 92%. She was found to snore loudly. She was 255 lb at the time of her previous sleep study.  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 She does not smoke and drinks alcohol rarely.  She drinks an energy drink as needed, about once a week and is not much of a caffeine drinker.   Her Past Medical History Is Significant For: Past Medical History  Diagnosis Date  . Obesity, Class III, BMI 40-49.9 (morbid obesity)   . GERD (gastroesophageal reflux disease)   . OSA on CPAP   . HTN (hypertension)   . HSV-2 infection     IgG  . Vitamin D insufficiency   . Hemorrhoid   . Iron (Fe) deficiency anemia   . Hyperlipidemia   . Allergy   . Depression   . Ulcer     gastric - h. pylori positive  . Arthritis     RA  . OSA on CPAP     Her Past Surgical History Is Significant For: Past Surgical History  Procedure Laterality Date  . Tubal ligation      Her Family History Is Significant For: Family History  Problem Relation Age of Onset  . Diabetes Mother   . Hypertension Mother   . Dementia Mother   . Stroke Mother 14  . Cancer Paternal Aunt     breast ca x 5 maternal aunts  . Diabetes Sister   . Colon cancer Neg Hx   . Esophageal cancer Neg Hx   . Rectal cancer Neg Hx   . Stomach cancer Neg Hx     Her Social History Is Significant For: History   Social History  . Marital Status: Divorced    Spouse Name: n/a    Number of Children: 3  . Years of Education: 12   Occupational History  . [employer]  .  [place] History Main Topics  . Smoking status: Never Smoker   . Smokeless tobacco: Never Used  . Alcohol Use: Yes     Comment: occasionally  . Drug Use: No  . Sexual Activity: Not Currently    Partners: Male     Comment: not active since divorce from husband 2010   Other Topics Concern  .  None   Social History Narrative   Lives alone. She and her siblings are taking turns staying with their mother, who has dementia and had a stroke.      Her Allergies Are:  Allergies  Allergen Reactions  . Adhesive [Tape] Rash    Skin Peels  :   Her Current Medications Are:  Outpatient Encounter Prescriptions as of 04/14/2014  Medication Sig  . Adalimumab (HUMIRA PEN [place]) Inject into the skin.  . cetirizine (ZYRTEC) 10 MG tablet Take 10 mg by mouth daily.  . cholecalciferol (VITAMIN D) 1000 UNITS tablet Take 1,000 Units by mouth daily.  Marland Kitchen  fish oil-omega-3 fatty acids 1000 MG capsule Take 2 g by mouth daily.  . iron polysaccharides (FERREX 150) 150 MG capsule TAKE ONE CAPSULE BY MOUTH TWICE DAILY  . lisinopril (PRINIVIL,ZESTRIL) 20 MG tablet Take 1 tablet (20 mg total) by mouth 2 (two) times daily.  Marland Kitchen omeprazole (PRILOSEC) 40 MG capsule Take 1 capsule (40 mg total) by mouth daily.  . [DISCONTINUED] docusate sodium (COLACE) 50 MG capsule Take by mouth 2 (two) times daily as needed for constipation.  . [DISCONTINUED] folic acid (FOLVITE) 1 MG tablet Take 1 tablet by mouth daily.  . [DISCONTINUED] hydrochlorothiazide (MICROZIDE) 12.5 MG capsule TAKE ONE CAPSULE BY MOUTH ONCE DAILY  . [DISCONTINUED] methotrexate (RHEUMATREX) 2.5 MG tablet Take 2.5 tablets by mouth once a week. Take 6 tabs once a week  . [DISCONTINUED] pravastatin (PRAVACHOL) 20 MG tablet Take 1 tablet (20 mg total) by mouth daily.  :  Review of Systems:  Out of a complete 14 point review of systems, all are reviewed and negative with the exception of these symptoms as listed below:    Review of Systems  Genitourinary:       Daytime Sleepiness snoring  Musculoskeletal: Positive for joint swelling.       Joint pain  Allergic/Immunologic: Positive for environmental allergies.  Neurological: Positive for numbness.       Sleep Apnea    Objective:  Neurologic Exam  Physical Exam Physical Examination:   Filed  Vitals:   04/14/14 1549  BP: 158/77  Pulse: 62  Temp: 98.3 F (36.8 C)     General Examination: The patient is a very pleasant 53 y.o. female in no acute distress. She appears well-developed and well-nourished and well groomed. She is obese.   HEENT: Normocephalic, atraumatic, pupils are equal, round and reactive to light and accommodation. Funduscopic exam is normal with sharp disc margins noted. Extraocular tracking is good without limitation to gaze excursion or nystagmus noted. Normal smooth pursuit is noted. Hearing is grossly intact. Face is symmetric with normal facial animation and normal facial sensation. Speech is clear with no dysarthria noted. There is no hypophonia. There is no lip, neck/head, jaw or voice tremor. Neck is supple with full range of passive and active motion. There are no carotid bruits on auscultation. Oropharynx exam reveals: mild mouth dryness, adequate dental hygiene and moderate airway crowding, due to large tongue and smaller airway entry. Mallampati is class II. Tongue protrudes centrally and palate elevates symmetrically. Tonsils are 1+ in size.    Chest: Clear to auscultation without wheezing, rhonchi or crackles noted.  Heart: S1+S2+0, regular and normal without murmurs, rubs or gallops noted.   Abdomen: Soft, non-tender and non-distended with normal bowel sounds appreciated on auscultation.  Extremities: There is trace edema in the distal lower extremities bilaterally. Pedal pulses are intact.  Skin: Warm and dry without trophic changes noted. There are no varicose veins.  Musculoskeletal: exam reveals no obvious joint deformities, but ankles and L knee are sore.   Neurologically:  Mental status: The patient is awake, alert and oriented in all 4 spheres. Her memory, attention, language and knowledge are appropriate. There is no aphasia, agnosia, apraxia or anomia. Speech is clear with normal prosody and enunciation. Thought process is linear. Mood is  congruent and affect is normal.  Cranial nerves are as described above under HEENT exam. In addition, shoulder shrug is normal with equal shoulder height noted. Motor exam: Normal bulk, strength and tone is noted. There is no drift, tremor or  rebound. Romberg is negative. Reflexes are 2+ throughout. Toes are downgoing bilaterally. Fine motor skills are intact with normal finger taps, normal hand movements, normal rapid alternating patting, normal foot taps and normal foot agility.  Cerebellar testing shows no dysmetria or intention tremor on finger to nose testing. Heel to shin is difficult for her. There is no truncal or gait ataxia.  Sensory exam is intact to light touch, pinprick, vibration, temperature sense in the upper and lower extremities.  Gait, station and balance are unremarkable. No veering to one side is noted. No leaning to one side is noted. Posture is age-appropriate and stance is narrow based. No problems turning are noted. She turns en bloc. Tandem walk is unremarkable. Intact toe and heel stance is noted.               Assessment and Plan:   In summary, Laurie Hodge is a very pleasant 53 year old female with an underlying medical history of rheumatoid arthritis, obesity, reflux disease, hypertension, vitamin D deficiency as well as iron deficiency anemia and history of HSV-2 infection, who presents for follow-up consultation of her obstructive sleep apnea, treated with CPAP with full compliance. She continues to report improvement of her sleep and her exam is stable. She is congratulated on her compliance of week talked about her compliance data. She is advised that her leak has actually increased and this may very well be because her mask needs replacement. I ordered a new set of supplies for CPAP treatment for her and we will fax this to her DME company. At this juncture, since she is doing well she is advised to return in 12 months for a routine sleep apnea checkup. She is advised  about her compliance data and I explained the numbers to her as well. She is congratulated on her great treatment adherence and encouraged to continue using CPAP regularly to help reduce her cardiovascular risks. She is again advised regarding maintaining a healthy lifestyle in general. I encouraged the patient to eat healthy, exercise daily and keep well hydrated, to keep a scheduled bedtime and wake time routine, to not skip any meals and eat healthy snacks in between meals and to have protein with every meal. I stressed the importance of regular exercise, within of course her physical limitations. I answered all her questions today and the patient was in agreement with the above outlined plan.

## 2014-04-29 ENCOUNTER — Other Ambulatory Visit: Payer: Self-pay | Admitting: Physician Assistant

## 2014-05-05 ENCOUNTER — Ambulatory Visit: Payer: BC Managed Care – PPO | Admitting: Physician Assistant

## 2014-05-11 ENCOUNTER — Telehealth: Payer: Self-pay | Admitting: Neurology

## 2014-05-11 NOTE — Telephone Encounter (Signed)
Laurie Hodge from [facility] calling to state that patient has already been taken care of on 04/22/14 regarding her CPAP supplies, if questions, please call.

## 2014-05-30 ENCOUNTER — Other Ambulatory Visit: Payer: Self-pay | Admitting: Physician Assistant

## 2014-06-25 ENCOUNTER — Ambulatory Visit: Payer: BC Managed Care – PPO | Admitting: Physician Assistant

## 2014-06-29 ENCOUNTER — Encounter: Payer: Self-pay | Admitting: Family Medicine

## 2014-06-29 ENCOUNTER — Ambulatory Visit (INDEPENDENT_AMBULATORY_CARE_PROVIDER_SITE_OTHER): Payer: BC Managed Care – PPO | Admitting: Family Medicine

## 2014-06-29 VITALS — BP 140/90 | HR 58 | Temp 98.4°F | Resp 16 | Ht 66.25 in | Wt 276.0 lb

## 2014-06-29 DIAGNOSIS — I10 Essential (primary) hypertension: Secondary | ICD-10-CM

## 2014-06-29 MED ORDER — LISINOPRIL 20 MG PO TABS
20.0000 mg | ORAL_TABLET | Freq: Two times a day (BID) | ORAL | Status: DC
Start: 2014-06-29 — End: 2014-12-29

## 2014-06-29 NOTE — Patient Instructions (Signed)
Why follow it? Research shows. . Those who follow the Mediterranean diet have a reduced risk of heart disease  . The diet is associated with a reduced incidence of Parkinson's and Alzheimer's diseases . People following the diet may have longer life expectancies and lower rates of chronic diseases  . The Dietary Guidelines for Americans recommends the Mediterranean diet as an eating plan to promote health and prevent disease  What Is the Mediterranean Diet?  . Healthy eating plan based on typical foods and recipes of Mediterranean-style cooking . The diet is primarily a plant based diet; these foods should make up a majority of meals   Starches - Plant based foods should make up a majority of meals - They are an important sources of vitamins, minerals, energy, antioxidants, and fiber - Choose whole grains, foods high in fiber and minimally processed items  - Typical grain sources include wheat, oats, barley, corn, Wandel rice, bulgar, farro, millet, polenta, couscous  - Various types of beans include chickpeas, lentils, fava beans, black beans, white beans   Fruits  Veggies - Large quantities of antioxidant rich fruits & veggies; 6 or more servings  - Vegetables can be eaten raw or lightly drizzled with oil and cooked  - Vegetables common to the traditional Mediterranean Diet include: artichokes, arugula, beets, broccoli, brussel sprouts, cabbage, carrots, celery, collard greens, cucumbers, eggplant, kale, leeks, lemons, lettuce, mushrooms, okra, onions, peas, peppers, potatoes, pumpkin, radishes, rutabaga, shallots, spinach, sweet potatoes, turnips, zucchini - Fruits common to the Mediterranean Diet include: apples, apricots, avocados, cherries, clementines, dates, figs, grapefruits, grapes, melons, nectarines, oranges, peaches, pears, pomegranates, strawberries, tangerines  Fats - Replace butter and margarine with healthy oils, such as olive oil, canola oil, and tahini  - Limit nuts to no  more than a handful a day  - Nuts include walnuts, almonds, pecans, pistachios, pine nuts  - Limit or avoid candied, honey roasted or heavily salted nuts - Olives are central to the Mediterranean diet - can be eaten whole or used in a variety of dishes   Meats Protein - Limiting red meat: no more than a few times a month - When eating red meat: choose lean cuts and keep the portion to the size of deck of cards - Eggs: approx. 0 to 4 times a week  - Fish and lean poultry: at least 2 a week  - Healthy protein sources include, chicken, turkey, lean beef, lamb - Increase intake of seafood such as tuna, salmon, trout, mackerel, shrimp, scallops - Avoid or limit high fat processed meats such as sausage and bacon  Dairy - Include moderate amounts of low fat dairy products  - Focus on healthy dairy such as fat free yogurt, skim milk, low or reduced fat cheese - Limit dairy products higher in fat such as whole or 2% milk, cheese, ice cream  Alcohol - Moderate amounts of red wine is ok  - No more than 5 oz daily for women (all ages) and men older than age 65  - No more than 10 oz of wine daily for men younger than 65  Other - Limit sweets and other desserts  - Use herbs and spices instead of salt to flavor foods  - Herbs and spices common to the traditional Mediterranean Diet include: basil, bay leaves, chives, cloves, cumin, fennel, garlic, lavender, marjoram, mint, oregano, parsley, pepper, rosemary, sage, savory, sumac, tarragon, thyme   It's not just a diet, it's a lifestyle:  . The Mediterranean diet includes   lifestyle factors typical of those in the region  . Foods, drinks and meals are best eaten with others and savored . Daily physical activity is important for overall good health . This could be strenuous exercise like running and aerobics . This could also be more leisurely activities such as walking, housework, yard-work, or taking the stairs . Moderation is the key; a balanced and  healthy diet accommodates most foods and drinks . Consider portion sizes and frequency of consumption of certain foods   Meal Ideas & Options:  . Breakfast:  o Whole wheat toast or whole wheat English muffins with peanut butter & hard boiled egg o Steel cut oats topped with apples & cinnamon and skim milk  o Fresh fruit: banana, strawberries, melon, berries, peaches  o Smoothies: strawberries, bananas, greek yogurt, peanut butter o Low fat greek yogurt with blueberries and granola  o Egg white omelet with spinach and mushrooms o Breakfast couscous: whole wheat couscous, apricots, skim milk, cranberries  . Sandwiches:  o Hummus and grilled vegetables (peppers, zucchini, squash) on whole wheat bread   o Grilled chicken on whole wheat pita with lettuce, tomatoes, cucumbers or tzatziki  o Tuna salad on whole wheat bread: tuna salad made with greek yogurt, olives, red peppers, capers, green onions o Garlic rosemary lamb pita: lamb sauted with garlic, rosemary, salt & pepper; add lettuce, cucumber, greek yogurt to pita - flavor with lemon juice and black pepper  . Seafood:  o Mediterranean grilled salmon, seasoned with garlic, basil, parsley, lemon juice and black pepper o Shrimp, lemon, and spinach whole-grain pasta salad made with low fat greek yogurt  o Seared scallops with lemon orzo  o Seared tuna steaks seasoned salt, pepper, coriander topped with tomato mixture of olives, tomatoes, olive oil, minced garlic, parsley, green onions and cappers  . Meats:  o Herbed greek chicken salad with kalamata olives, cucumber, feta  o Red bell peppers stuffed with spinach, bulgur, lean ground beef (or lentils) & topped with feta   o Kebabs: skewers of chicken, tomatoes, onions, zucchini, squash  o Turkey burgers: made with red onions, mint, dill, lemon juice, feta cheese topped with roasted red peppers . Vegetarian o Cucumber salad: cucumbers, artichoke hearts, celery, red onion, feta cheese, tossed in  olive oil & lemon juice  o Hummus and whole grain pita points with a greek salad (lettuce, tomato, feta, olives, cucumbers, red onion) o Lentil soup with celery, carrots made with vegetable broth, garlic, salt and pepper  o Tabouli salad: parsley, bulgur, mint, scallions, cucumbers, tomato, radishes, lemon juice, olive oil, salt and pepper.      

## 2014-06-29 NOTE — Progress Notes (Signed)
Subjective:    Patient ID: Laurie Hodge, female    DOB: July 13, 1960, 54 y.o.   MRN: [id number]  HPI This is a very pleasant 54 yo female patient of Chelle Jefffery, PA-C, who presents today for follow up of HTN. She takes lisinopril 20 mg BID and HCTZ 25 mg prn- she reports taking this 3 out of 4 weeks of the month for lower extremity edema. She did not take HCTZ today.  She has a history of RA and is currently on Humira with good results and just a little residual stiffness. She does not exercise. Her weight is "creeping up," she's noticed. She eats fast food frequently and drinks 2 regular lemonade drinks daily. She doesn't mind cooking, but doesn't have much time to cook.  She had a bad cold last month with a cough. The cough has persisted, but is almost gone. No sputum, no fever, no SOB. Occasional post nasal drainage.  She had labs drawn at her last rheumatology visit 11/15 (CBC, CMET), and she reports they were normal.   Past Medical History  Diagnosis Date  . Obesity, Class III, BMI 40-49.9 (morbid obesity)   . GERD (gastroesophageal reflux disease)   . OSA on CPAP   . HTN (hypertension)   . HSV-2 infection     IgG  . Vitamin D insufficiency   . Hemorrhoid   . Iron (Fe) deficiency anemia   . Hyperlipidemia   . Allergy   . Depression   . Ulcer     gastric - h. pylori positive  . Arthritis     RA  . OSA on CPAP    Past Surgical History  Procedure Laterality Date  . Tubal ligation      Review of Systems  Constitutional: Negative for fever and chills.  HENT: Positive for congestion and postnasal drip. Negative for ear pain and sore throat.   Respiratory: Positive for cough. Negative for shortness of breath and wheezing.   Cardiovascular: Positive for leg swelling (chronic). Negative for chest pain.  Gastrointestinal: Negative for nausea, vomiting, diarrhea and constipation.      Objective:   Physical Exam  Constitutional: She is oriented to person, place, and  time. She appears well-developed and well-nourished.  Obese.  HENT:  Head: Normocephalic and atraumatic.  Right Ear: Tympanic membrane, external ear and ear canal normal.  Left Ear: Tympanic membrane, external ear and ear canal normal.  Nose: Rhinorrhea present.  Mouth/Throat: Uvula is midline, oropharynx is clear and moist and mucous membranes are normal.  Eyes: Conjunctivae are normal.  Neck: Normal range of motion. Neck supple.  Cardiovascular: Normal rate, regular rhythm and normal heart sounds.   Pulmonary/Chest: Effort normal and breath sounds normal.  Musculoskeletal: Normal range of motion. She exhibits edema (trace pedal).  Lymphadenopathy:    She has no cervical adenopathy.  Neurological: She is alert and oriented to person, place, and time.  Skin: Skin is warm and dry.  Psychiatric: She has a normal mood and affect. Her behavior is normal. Judgment and thought content normal.  Vitals reviewed.  BP 140/90 mmHg  Pulse 58  Temp(Src) 98.4 F (36.9 C) (Oral)  Resp 16  Ht 5' 6.25" (1.683 m)  Wt 276 lb (125.193 kg)  BMI 44.20 kg/m2  SpO2 100%  LMP 06/27/2014    Assessment & Plan:  1. Essential hypertension - lisinopril (PRINIVIL,ZESTRIL) 20 MG tablet; Take 1 tablet (20 mg total) by mouth 2 (two) times daily.  Dispense: 180 tablet; Refill: 2 -  recent CBC, CMET normal - follow up for CPE in 6 months with Chelle  2. Obesity, Class III, BMI 40-49.9 (morbid obesity) - Encouraged gentle exercise, weight loss -Mediterranean diet information provided to patient and encouraged 1/2-1 pound weight loss per week, by eliminating calorie containing  beverages, limiting fast food, fried foods, breads/pasta/rice/potatoes. Encouraged increased lean proteins, vegetables, fruits.    Elby Beck, FNP-BC  [facility]  06/29/2014 8:32 PM

## 2014-07-16 ENCOUNTER — Ambulatory Visit: Payer: BC Managed Care – PPO | Admitting: Physician Assistant

## 2014-09-20 ENCOUNTER — Encounter: Payer: Self-pay | Admitting: Physician Assistant

## 2014-09-20 DIAGNOSIS — M069 Rheumatoid arthritis, unspecified: Secondary | ICD-10-CM

## 2014-12-29 ENCOUNTER — Ambulatory Visit (INDEPENDENT_AMBULATORY_CARE_PROVIDER_SITE_OTHER): Payer: BC Managed Care – PPO | Admitting: Physician Assistant

## 2014-12-29 ENCOUNTER — Encounter: Payer: Self-pay | Admitting: Physician Assistant

## 2014-12-29 VITALS — BP 142/84 | HR 67 | Temp 98.9°F | Resp 16 | Ht 66.0 in | Wt 276.6 lb

## 2014-12-29 DIAGNOSIS — K219 Gastro-esophageal reflux disease without esophagitis: Secondary | ICD-10-CM

## 2014-12-29 DIAGNOSIS — Z9989 Dependence on other enabling machines and devices: Secondary | ICD-10-CM

## 2014-12-29 DIAGNOSIS — G4733 Obstructive sleep apnea (adult) (pediatric): Secondary | ICD-10-CM | POA: Diagnosis not present

## 2014-12-29 DIAGNOSIS — E785 Hyperlipidemia, unspecified: Secondary | ICD-10-CM | POA: Diagnosis not present

## 2014-12-29 DIAGNOSIS — D509 Iron deficiency anemia, unspecified: Secondary | ICD-10-CM | POA: Diagnosis not present

## 2014-12-29 DIAGNOSIS — Z0000 Encounter for general adult medical examination without abnormal findings: Secondary | ICD-10-CM | POA: Diagnosis not present

## 2014-12-29 DIAGNOSIS — I10 Essential (primary) hypertension: Secondary | ICD-10-CM

## 2014-12-29 DIAGNOSIS — Z114 Encounter for screening for human immunodeficiency virus [HIV]: Secondary | ICD-10-CM

## 2014-12-29 LAB — COMPREHENSIVE METABOLIC PANEL
ALK PHOS: 62 U/L (ref 39–117)
ALT: 16 U/L (ref 0–35)
AST: 17 U/L (ref 0–37)
Albumin: 3.9 g/dL (ref 3.5–5.2)
BUN: 11 mg/dL (ref 6–23)
CO2: 23 mEq/L (ref 19–32)
CREATININE: 0.66 mg/dL (ref 0.50–1.10)
Calcium: 9 mg/dL (ref 8.4–10.5)
Chloride: 105 mEq/L (ref 96–112)
GLUCOSE: 98 mg/dL (ref 70–99)
POTASSIUM: 4.4 meq/L (ref 3.5–5.3)
Sodium: 138 mEq/L (ref 135–145)
Total Bilirubin: 0.3 mg/dL (ref 0.2–1.2)
Total Protein: 7.3 g/dL (ref 6.0–8.3)

## 2014-12-29 LAB — CBC WITH DIFFERENTIAL/PLATELET
Basophils Absolute: 0.1 10*3/uL (ref 0.0–0.1)
Basophils Relative: 1 % (ref 0–1)
Eosinophils Absolute: 0.1 10*3/uL (ref 0.0–0.7)
Eosinophils Relative: 2 % (ref 0–5)
HEMATOCRIT: 31 % — AB (ref 36.0–46.0)
Hemoglobin: 9.7 g/dL — ABNORMAL LOW (ref 12.0–15.0)
LYMPHS ABS: 1.8 10*3/uL (ref 0.7–4.0)
Lymphocytes Relative: 36 % (ref 12–46)
MCH: 24.3 pg — AB (ref 26.0–34.0)
MCHC: 31.3 g/dL (ref 30.0–36.0)
MCV: 77.5 fL — ABNORMAL LOW (ref 78.0–100.0)
MONO ABS: 0.5 10*3/uL (ref 0.1–1.0)
MPV: 8 fL — AB (ref 8.6–12.4)
Monocytes Relative: 9 % (ref 3–12)
Neutro Abs: 2.7 10*3/uL (ref 1.7–7.7)
Neutrophils Relative %: 52 % (ref 43–77)
Platelets: 397 10*3/uL (ref 150–400)
RBC: 4 MIL/uL (ref 3.87–5.11)
RDW: 17.4 % — ABNORMAL HIGH (ref 11.5–15.5)
WBC: 5.1 10*3/uL (ref 4.0–10.5)

## 2014-12-29 LAB — LIPID PANEL
CHOL/HDL RATIO: 4.2 ratio
CHOLESTEROL: 219 mg/dL — AB (ref 0–200)
HDL: 52 mg/dL (ref >=46)
LDL Cholesterol: 144 mg/dL — ABNORMAL HIGH (ref 0–99)
Triglycerides: 116 mg/dL (ref <150)
VLDL: 23 mg/dL (ref 0–40)

## 2014-12-29 LAB — HIV ANTIBODY (ROUTINE TESTING W REFLEX): HIV 1&2 Ab, 4th Generation: NONREACTIVE

## 2014-12-29 LAB — TSH: TSH: 2.6 u[IU]/mL (ref 0.350–4.500)

## 2014-12-29 MED ORDER — LISINOPRIL 20 MG PO TABS: 20.0000 mg | ORAL_TABLET | Freq: Two times a day (BID) | ORAL | Status: DC

## 2014-12-29 MED ORDER — POLYSACCHARIDE IRON COMPLEX 150 MG PO CAPS: ORAL_CAPSULE | ORAL | Status: DC

## 2014-12-29 MED ORDER — HYDROCHLOROTHIAZIDE 12.5 MG PO CAPS: 12.5000 mg | ORAL_CAPSULE | Freq: Every day | ORAL | Status: DC

## 2014-12-29 NOTE — Progress Notes (Signed)
Patient ID: LILYMARIE SCROGGINS, female    DOB: [birth year], 54 y.o.   MRN: [id number]  PCP: Julietta Batterman, PA-C  Chief Complaint  Patient presents with  . Annual Exam    Subjective:   HPI: Meagan is here today for annual wellness exam.  Pap and HPV were normal/negative in 2012, so she does not need cervical cancer screening today. Mammogram is scheduled for later this month. She will meet with a bariatric specialist next week to discuss weight loss options. She's gained considerable weight over the past year and notes reduced exercise tolerance and increased SOB.  HTN has not been well controlled at several visits in the past year, but she's not taking the HCTZ (she uses it PRN but hasn't taken a dose in months).  She is due next month for a follow-up with rheumatology. The rheumatologists have left [facility] and opened [facility]. She is followed by Marella Chimes, PA-C.  She reports a tenderness in the RIGHT breast that developed just before her most recent period, which is typical, but hasn't resolved. Associated with the tenderness is a swelling in the RIGHT axilla, which she's not had before.    Patient Active Problem List   Diagnosis Date Noted  . Hyperlipidemia 11/06/2013  . Rheumatoid arthritis 06/10/2013  . Obesity, Class III, BMI 40-49.9 (morbid obesity)   . GERD (gastroesophageal reflux disease)   . OSA on CPAP   . HTN (hypertension)   . HSV-2 infection   . Vitamin D insufficiency   . Iron (Fe) deficiency anemia     Past Medical History  Diagnosis Date  . Obesity, Class III, BMI 40-49.9 (morbid obesity)   . GERD (gastroesophageal reflux disease)   . OSA on CPAP   . HTN (hypertension)   . HSV-2 infection     IgG  . Vitamin D insufficiency   . Hemorrhoid   . Iron (Fe) deficiency anemia   . Hyperlipidemia   . Allergy   . Depression   . Ulcer     gastric - h. pylori positive  . Arthritis     RA  . OSA on CPAP      Prior to Admission medications     Medication Sig Start Date End Date Taking? Authorizing Provider  cetirizine (ZYRTEC) 10 MG tablet Take 10 mg by mouth daily.   Yes Historical Provider, MD  cholecalciferol (VITAMIN D) 1000 UNITS tablet Take 1,000 Units by mouth daily.   Yes Historical Provider, MD  fish oil-omega-3 fatty acids 1000 MG capsule Take 2 g by mouth daily.   Yes Historical Provider, MD  HUMIRA PEN 40 MG/0.8ML PNKT  06/23/14  Yes Historical Provider, MD  iron polysaccharides (FERREX 150) 150 MG capsule TAKE ONE CAPSULE BY MOUTH TWICE DAILY 12/29/14  Yes Norie Latendresse, PA-C  lisinopril (PRINIVIL,ZESTRIL) 20 MG tablet Take 1 tablet (20 mg total) by mouth 2 (two) times daily. 12/29/14  Yes Ronie Barnhart, PA-C  Multiple Vitamin (MULTI VITAMIN DAILY PO) Take by mouth. Vita fusion for women   Yes Historical Provider, MD  vitamin B-12 (CYANOCOBALAMIN) 1000 MCG tablet Take 1,000 mcg by mouth daily.   Yes Historical Provider, MD  hydrochlorothiazide (MICROZIDE) 12.5 MG capsule Take 1 capsule (12.5 mg total) by mouth daily. 12/29/14   Harrison Mons, PA-C    Allergies  Allergen Reactions  . Adhesive [Tape] Rash    Skin Peels    Past Surgical History  Procedure Laterality Date  . Tubal ligation      Family  History  Problem Relation Age of Onset  . Diabetes Mother   . Hypertension Mother   . Dementia Mother   . Stroke Mother 29  . Hyperlipidemia Mother   . Cancer Paternal Aunt     breast ca x 5 maternal aunts  . Diabetes Sister   . Heart disease Sister   . Hyperlipidemia Sister   . Hypertension Sister   . Colon cancer Neg Hx   . Esophageal cancer Neg Hx   . Rectal cancer Neg Hx   . Stomach cancer Neg Hx   . Heart disease Father   . Hyperlipidemia Father   . Hypertension Father   . Stroke Father   . Heart disease Sister   . Hyperlipidemia Sister   . Hypertension Sister   . Diabetes Brother   . Hyperlipidemia Brother   . Hypertension Brother     History   Social History  . Marital Status: Divorced     Spouse Name: n/a  . Number of Children: 3  . Years of Education: 12   Occupational History  . [employer]  .  [place] History Main Topics  . Smoking status: Never Smoker   . Smokeless tobacco: Never Used  . Alcohol Use: Yes     Comment: occasionally  . Drug Use: No  . Sexual Activity:    Partners: Male     Comment: not active since divorce from husband 2010   Other Topics Concern  . None   Social History Narrative   Lives alone. She and her siblings are taking turns staying with their mother, who has dementia and had a stroke.         Review of Systems Constitutional: Negative.  HENT: Negative.  Eyes: Negative.  Respiratory: Positive for apnea.  OSA controlled with CPAP. Pt endorses 2 breakthough episodes of apnea in the past 6 months.  Cardiovascular: Negative.  Gastrointestinal: Negative.  Endocrine: Negative.  Genitourinary: Negative.  Musculoskeletal: Negative.  Skin: Negative.  Allergic/Immunologic: Negative.  Neurological: Negative.  Hematological: Negative.  Psychiatric/Behavioral: Negative.       Objective:  Physical Exam  Constitutional: She is oriented to person, place, and time. Vital signs are normal. She appears well-developed and well-nourished. She is active and cooperative. No distress.  BP 142/84 mmHg  Pulse 67  Temp(Src) 98.9 F (37.2 C) (Oral)  Resp 16  Ht 5\' 6"  (1.676 m)  Wt 276 lb 9.6 oz (125.465 kg)  BMI 44.67 kg/m2  SpO2 98%  LMP 12/10/2014   HENT:  Head: Normocephalic and atraumatic.  Right Ear: Hearing, tympanic membrane, external ear and ear canal normal. No foreign bodies.  Left Ear: Hearing, tympanic membrane, external ear and ear canal normal. No foreign bodies.  Nose: Nose normal.  Mouth/Throat: Uvula is midline, oropharynx is clear and moist and mucous membranes are normal. No oral lesions. Normal dentition. No dental abscesses or uvula swelling. No oropharyngeal  exudate.  Eyes: Conjunctivae, EOM and lids are normal. Pupils are equal, round, and reactive to light. Right eye exhibits no discharge. Left eye exhibits no discharge. No scleral icterus.  Fundoscopic exam:      The right eye shows no arteriolar narrowing, no AV nicking, no exudate, no hemorrhage and no papilledema. The right eye shows red reflex.       The left eye shows no arteriolar narrowing, no AV nicking, no exudate, no hemorrhage and no papilledema. The left eye shows red reflex.  Neck: Trachea  normal, normal range of motion and full passive range of motion without pain. Neck supple. No spinous process tenderness and no muscular tenderness present. No thyroid mass and no thyromegaly present.  Cardiovascular: Normal rate, regular rhythm, normal heart sounds, intact distal pulses and normal pulses.   Pulmonary/Chest: Effort normal and breath sounds normal. Right breast exhibits no inverted nipple, no mass, no nipple discharge, no skin change and no tenderness. Left breast exhibits no inverted nipple, no mass, no nipple discharge, no skin change and no tenderness. Breasts are symmetrical.  Denser breast tissue is palpable in the outer quadrants of both breasts, not more prominent in the tender RIGHT breast. No axillary masses, though the soft tissue in the RIGHT axilla is twice the size of that on the LEFT.  Abdominal: Soft. Bowel sounds are normal.  Musculoskeletal: She exhibits no edema or tenderness.       Cervical back: Normal.       Thoracic back: Normal.       Lumbar back: Normal.  Lymphadenopathy:       Head (right side): No tonsillar, no preauricular, no posterior auricular and no occipital adenopathy present.       Head (left side): No tonsillar, no preauricular, no posterior auricular and no occipital adenopathy present.    She has no cervical adenopathy.       Right: No supraclavicular adenopathy present.       Left: No supraclavicular adenopathy present.  Neurological: She is  alert and oriented to person, place, and time. She has normal strength and normal reflexes. No cranial nerve deficit. She exhibits normal muscle tone. Coordination and gait normal.  Skin: Skin is warm, dry and intact. No rash noted. She is not diaphoretic. No cyanosis or erythema. Nails show no clubbing.  Psychiatric: She has a normal mood and affect. Her speech is normal and behavior is normal. Judgment and thought content normal.           Assessment & Plan:  1. Annual physical exam Age appropriate anticipatory guidance provided.  2. Essential hypertension Just above goal. Resume HCTZ daily. Continue lisinopril. Monitor for lightheadedness with rapid position changes. - Comprehensive metabolic panel - TSH - POCT UA - Microscopic Only - lisinopril (PRINIVIL,ZESTRIL) 20 MG tablet; Take 1 tablet (20 mg total) by mouth 2 (two) times daily.  Dispense: 180 tablet; Refill: 3 - hydrochlorothiazide (MICROZIDE) 12.5 MG capsule; Take 1 capsule (12.5 mg total) by mouth daily.  Dispense: 90 capsule; Refill: 3  3. Hyperlipidemia Await lab results. Weight loss will likely improve this. Counseled on health yeating and regular exercise, specifically water exercise. - Lipid panel  4. Iron (Fe) deficiency anemia Await CBC results. Continue Fe supplementation. - CBC with Differential/Platelet - iron polysaccharides (FERREX 150) 150 MG capsule; TAKE ONE CAPSULE BY MOUTH TWICE DAILY  Dispense: 180 capsule; Refill: 3  5. OSA on CPAP Continue CPAP. If breakthrough apnea events increase, follow-up with Dr. Rexene Alberts.  6. Gastroesophageal reflux disease, esophagitis presence not specified Controlled/Stable on current treatment.  7. Screening for HIV (human immunodeficiency virus) - HIV antibody   Return in about 6 months (around 07/01/2015).   Fara Chute, PA-C Physician Assistant-Certified [facility]

## 2014-12-29 NOTE — Patient Instructions (Signed)
Call [facility] and confirm your follow-up visit with Marella Chimes, PA-C Address: [address] [place], [place] [address]  [phone]   I will contact you with your lab results as soon as they are available.   If you have not heard from me in 2 weeks, please contact me.  The fastest way to get your results is to register for My Chart (see the instructions on the last page of this printout).  Keeping You Healthy  Get These Tests  Blood Pressure- Have your blood pressure checked by your healthcare provider at least once a year.  Normal blood pressure is 120/80.  Weight- Have your body mass index (BMI) calculated to screen for obesity.  BMI is a measure of body fat based on height and weight.  You can calculate your own BMI at [employer]velB[employer].it  Cholesterol- Have your cholesterol checked every year.  Diabetes- Have your blood sugar checked every year if you have high blood pressure, high cholesterol, a family history of diabetes or if you are overweight.  Pap Test - Have a pap test every 1 to 5 years if you have been sexually active.  If you are older than 65 and recent pap tests have been normal you may not need additional pap tests.  In addition, if you have had a hysterectomy  for benign disease additional pap tests are not necessary.  Mammogram-Yearly mammograms are essential for early detection of breast cancer  Screening for Colon Cancer- Colonoscopy starting at age 92. Screening may begin sooner depending on your family history and other health conditions.  Follow up colonoscopy as directed by your Gastroenterologist.  Screening for Osteoporosis- Screening begins at age 52 with bone density scanning, sooner if you are at higher risk for developing Osteoporosis.  Get these medicines  Calcium with Vitamin D- Your body requires 1200-1500 mg of Calcium a day and [phone] IU of Vitamin D a day.  You can only absorb 500 mg of Calcium at a time therefore  Calcium must be taken in 2 or 3 separate doses throughout the day.  Hormones- Hormone therapy has been associated with increased risk for certain cancers and heart disease.  Talk to your healthcare provider about if you need relief from menopausal symptoms.  Aspirin- Ask your healthcare provider about taking Aspirin to prevent Heart Disease and Stroke.  Get these Immuniztions  Flu shot- Every fall  Pneumonia shot- Once after the age of 48; if you are younger ask your healthcare provider if you need a pneumonia shot.  Tetanus- Every ten years.  Zostavax- Once after the age of 44 to prevent shingles.  Take these steps  Don't smoke- Your healthcare provider can help you quit. For tips on how to quit, ask your healthcare provider or go to www.smokefree.gov or call 1-800 QUIT-NOW.  Be physically active- Exercise 5 days a week for a minimum of 30 minutes.  If you are not already physically active, start slow and gradually work up to 30 minutes of moderate physical activity.  Try walking, dancing, bike riding, swimming, etc.  Eat a healthy diet- Eat a variety of healthy foods such as fruits, vegetables, whole grains, low fat milk, low fat cheeses, yogurt, lean meats, chicken, fish, eggs, dried beans, tofu, etc.  For more information go to www.thenutritionsource.org  Dental visit- Brush and floss teeth twice daily; visit your dentist twice a year.  Eye exam- Visit your Optometrist or Ophthalmologist yearly.  Drink alcohol in moderation- Limit alcohol intake to  one drink or less a day.  Never drink and drive.  Depression- Your emotional health is as important as your physical health.  If you're feeling down or losing interest in things you normally enjoy, please talk to your healthcare provider.  Seat Belts- can save your life; always wear one  Smoke/Carbon Monoxide detectors- These detectors need to be installed on the appropriate level of your home.  Replace batteries at least once a  year.  Violence- If anyone is threatening or hurting you, please tell your healthcare provider.  Living Will/ Health care power of attorney- Discuss with your healthcare provider and family.

## 2014-12-29 NOTE — Progress Notes (Signed)
Subjective:    Patient ID: Laurie Hodge, female    DOB: 10/23/1960, 54 y.o.   MRN: [id number]  HPI  Patient presents for her annual physical exam. She states that her only concern today is tenderness of her right breast and a firm mass of her right axilla, occuring for two weeks. The pt endorses fatigue that has been lasting for over two months, which she attributes to her weight gain. She states that vitamin B12 has provided some benefit to her energy levels. She has a History of HTN that is uncontrolled using lisinopril 20mg  BID. She has a PMH of OSA which is controlled with use of CPAP. The patient is obese with a history of hyperlipidemia and states that she has an appointment on 01/04/2015 with a bariatric surgeon for consultation on nutrition, exercise, and weight loss. Her RA is managed by Marella Chimes, PA-C and she is scheduled to see her this month.   Review of Systems  Constitutional: Negative.   HENT: Negative.   Eyes: Negative.   Respiratory: Positive for apnea.        OSA controlled with CPAP. Pt endorses 2 breakthough episodes of apnea in the past 6 months.   Cardiovascular: Negative.   Gastrointestinal: Negative.   Endocrine: Negative.   Genitourinary: Negative.   Musculoskeletal: Negative.   Skin: Negative.   Allergic/Immunologic: Negative.   Neurological: Negative.   Hematological: Negative.   Psychiatric/Behavioral: Negative.        Objective:   Physical Exam  Constitutional: She is oriented to person, place, and time. She appears well-developed and well-nourished. No distress.  HENT:  Head: Normocephalic and atraumatic.  Right Ear: External ear normal.  Left Ear: External ear normal.  Nose: Nose normal.  Mouth/Throat: Oropharynx is clear and moist. No oropharyngeal exudate.  Eyes: Conjunctivae are normal. Pupils are equal, round, and reactive to light. Right eye exhibits no discharge. Left eye exhibits no discharge. No scleral icterus.  Neck: Normal range of  motion. Neck supple. No JVD present. No tracheal deviation present. No thyromegaly present.  Cardiovascular: Normal rate, regular rhythm, normal heart sounds and intact distal pulses.  Exam reveals no gallop and no friction rub.   No murmur heard. Pulmonary/Chest: Effort normal and breath sounds normal. No respiratory distress. She has no wheezes. She has no rales. She exhibits no tenderness.  Abdominal: Soft. Bowel sounds are normal. She exhibits no distension and no mass. There is no tenderness. There is no rebound and no guarding.  Musculoskeletal: Normal range of motion. She exhibits no edema or tenderness.  Lymphadenopathy:    She has no cervical adenopathy.  Neurological: She is alert and oriented to person, place, and time. She has normal reflexes. No cranial nerve deficit. Coordination normal.  Skin: Skin is warm and dry. No rash noted. She is not diaphoretic. No erythema. No pallor.  Psychiatric: She has a normal mood and affect. Her behavior is normal. Thought content normal.          Assessment & Plan:  1. Primary Hypertension: Continue Lisinopril 20 mg BID. Add HCTZ 12.5 mg PO daily in AM with food for increased control. Have patient call the office if she experiences new lightheadedness or dizziness.   2.Hyperlipidemia: Order lipid panel. If LDL is still elevated, initiate stain therapy at next visit. 3.Obesity: patient is scheduled to see bariatrics on July 18th. Follow up with patients in 6 months at her next visit to see what plans she has implemented in terms of  diet and exercise.  4. OSA: continue CPAP use and encourage weight loss with diet and exercise 5. Iron deficiency anemia: controlled with iron supplementation 6. Rheumatoid Arthritis: controlled with the HUMIRA PEN 40 mg/0.8 mLPNKT, pt is follow by Marella Chimes, PA-C 6. Screening/Health Maintenance: HIV testing, pt scheduled to receive Pap Smear next year. Pt is scheduled for a mammogram this month.  Schedule follow up  visit in 6 months to re-evaluate breast pain, hyperlipidemia, and weight loss.

## 2014-12-31 MED ORDER — ATORVASTATIN CALCIUM 20 MG PO TABS: 20.0000 mg | ORAL_TABLET | Freq: Every day | ORAL | Status: DC

## 2014-12-31 NOTE — Addendum Note (Signed)
Addended by: Fara Chute on: 12/31/2014 10:21 AM   Modules accepted: Orders

## 2015-01-08 LAB — HM MAMMOGRAPHY: HM MAMMO: NEGATIVE

## 2015-01-19 ENCOUNTER — Encounter: Payer: Self-pay | Admitting: Physician Assistant

## 2015-01-25 ENCOUNTER — Telehealth: Payer: Self-pay

## 2015-01-25 NOTE — Telephone Encounter (Signed)
FYI Chelle

## 2015-01-25 NOTE — Telephone Encounter (Signed)
Pt has decided that she does want the baractric surgery  Referral   Best number [phone]

## 2015-02-02 NOTE — Telephone Encounter (Signed)
LMOM that referral was placed.

## 2015-02-02 NOTE — Telephone Encounter (Signed)
Referral placed.  Orders Placed This Encounter  Procedures  . Ambulatory referral to General Surgery    Referral Priority:  Routine    Referral Type:  Surgical    Referral Reason:  Specialty Services Required    Requested Specialty:  General Surgery    Number of Visits Requested:  1

## 2015-02-05 ENCOUNTER — Encounter: Payer: Self-pay | Admitting: Physician Assistant

## 2015-02-05 DIAGNOSIS — M069 Rheumatoid arthritis, unspecified: Secondary | ICD-10-CM

## 2015-02-05 DIAGNOSIS — M1990 Unspecified osteoarthritis, unspecified site: Secondary | ICD-10-CM

## 2015-02-18 ENCOUNTER — Other Ambulatory Visit: Payer: Self-pay | Admitting: Physician Assistant

## 2015-02-24 ENCOUNTER — Other Ambulatory Visit: Payer: Self-pay | Admitting: General Surgery

## 2015-02-24 DIAGNOSIS — Z01818 Encounter for other preprocedural examination: Secondary | ICD-10-CM

## 2015-03-22 ENCOUNTER — Ambulatory Visit (HOSPITAL_COMMUNITY)
Admission: RE | Admit: 2015-03-22 | Discharge: 2015-03-22 | Disposition: A | Discharge status: Home / Self Care | Payer: BC Managed Care – PPO | Source: Ambulatory Visit | Attending: General Surgery | Admitting: General Surgery

## 2015-03-22 ENCOUNTER — Other Ambulatory Visit (HOSPITAL_COMMUNITY): Payer: BC Managed Care – PPO

## 2015-03-22 ENCOUNTER — Other Ambulatory Visit: Payer: Self-pay

## 2015-03-22 DIAGNOSIS — Z01818 Encounter for other preprocedural examination: Secondary | ICD-10-CM | POA: Insufficient documentation

## 2015-03-22 DIAGNOSIS — K219 Gastro-esophageal reflux disease without esophagitis: Secondary | ICD-10-CM | POA: Insufficient documentation

## 2015-03-22 DIAGNOSIS — R001 Bradycardia, unspecified: Secondary | ICD-10-CM | POA: Diagnosis not present

## 2015-04-08 ENCOUNTER — Encounter: Payer: Self-pay | Admitting: Dietician

## 2015-04-08 ENCOUNTER — Encounter: Payer: BC Managed Care – PPO | Attending: General Surgery | Admitting: Dietician

## 2015-04-08 VITALS — Ht 66.0 in | Wt 277.5 lb

## 2015-04-08 DIAGNOSIS — Z713 Dietary counseling and surveillance: Secondary | ICD-10-CM | POA: Diagnosis not present

## 2015-04-08 DIAGNOSIS — Z6841 Body Mass Index (BMI) 40.0 and over, adult: Secondary | ICD-10-CM | POA: Insufficient documentation

## 2015-04-08 NOTE — Patient Instructions (Signed)

## 2015-04-08 NOTE — Progress Notes (Signed)
  Pre-Op Assessment Visit:  Pre-Operative Sleeve Gastrectomy Surgery  Medical Nutrition Therapy:  Appt start time: 5638   End time:  7564.  Patient was seen on 04/08/2015 for Pre-Operative Nutrition Assessment. Assessment and letter of approval faxed to [facility] Bariatric Surgery Program coordinator on 04/08/2015.   Preferred Learning Style:   No preference indicated   Learning Readiness:   Ready  Handouts given during visit include:  Pre-Op Goals Bariatric Surgery Protein Shakes   During the appointment today the following Pre-Op Goals were reviewed with the patient: Maintain or lose weight as instructed by your surgeon Make healthy food choices Begin to limit portion sizes Limited concentrated sugars and fried foods Keep fat/sugar in the single digits per serving on   food labels Practice CHEWING your food  (aim for 30 chews per bite or until applesauce consistency) Practice not drinking 15 minutes before, during, and 30 minutes after each meal/snack Avoid all carbonated beverages  Avoid/limit caffeinated beverages  Avoid all sugar-sweetened beverages Consume 3 meals per day; eat every 3-5 hours Make a list of non-food related activities Aim for 64-100 ounces of FLUID daily  Aim for at least 60-80 grams of PROTEIN daily Look for a liquid protein source that contain ?15 g protein and ?5 g carbohydrate  (ex: shakes, drinks, shots)  Patient-Centered Goals: Goals: would like to ride bike to work and would like to enjoy travelling more. 10 level of confidence/10 level of importance   Demonstrated degree of understanding via:  Teach Back  Teaching Method Utilized:  Visual Auditory Hands on  Barriers to learning/adherence to lifestyle change: none  Patient to [facility] the [facility] to enroll in Pre-Op and Post-Op Nutrition Education when surgery date is scheduled.

## 2015-04-15 ENCOUNTER — Ambulatory Visit: Payer: Self-pay | Admitting: Neurology

## 2015-04-19 ENCOUNTER — Telehealth: Payer: Self-pay

## 2015-04-19 NOTE — Telephone Encounter (Signed)
I am happy to write a letter, but I need to know what specifics it needs to include.  Call [facility]-the person who does the prior authorization for the surgery probably knows what needs to be documented/addressed in the letter.

## 2015-04-19 NOTE — Telephone Encounter (Signed)
Patient needs a note for [place] stating that she qualifies for weight loss surgery Fax: [phone] Attn: Bariatric Office Coordinator Please call patient once completed! [phone]

## 2015-04-19 NOTE — Telephone Encounter (Signed)
Chelle, is this something you can do or does she need to RTC?

## 2015-04-20 ENCOUNTER — Telehealth: Payer: Self-pay | Admitting: Physician Assistant

## 2015-04-20 NOTE — Telephone Encounter (Signed)
Called [facility] and asked what the note should state.

## 2015-04-20 NOTE — Telephone Encounter (Signed)
Patient daughter dropped off a form for her mother to be completed for surgical clearance. Daughter didn't give a time frame for the form to be completed. Please call patient when form is ready [phone]. I will place the form in the nurse's box at 102. Patient request for Chelle to complete the form.

## 2015-04-20 NOTE — Telephone Encounter (Signed)
I will put in your box Chelle.

## 2015-04-20 NOTE — Telephone Encounter (Signed)
[facility] stated she has a form that was given to her in the seminar and I wanted to see if pt could bring this by our office to fill out. She stated it the "Dear Doctor letter"

## 2015-04-21 NOTE — Telephone Encounter (Signed)
Pt left a voicemail stating she dropped off the form. Form placed in Chelle's box.

## 2015-04-24 IMAGING — CR DG ELBOW COMPLETE 3+V*R*
3 series · 3 of 3 positions shown · non-contrast
Comparison: None

CLINICAL DATA: Right side pain, fell down steps

RIGHT ELBOW - COMPLETE 3+ VIEW

[AP]
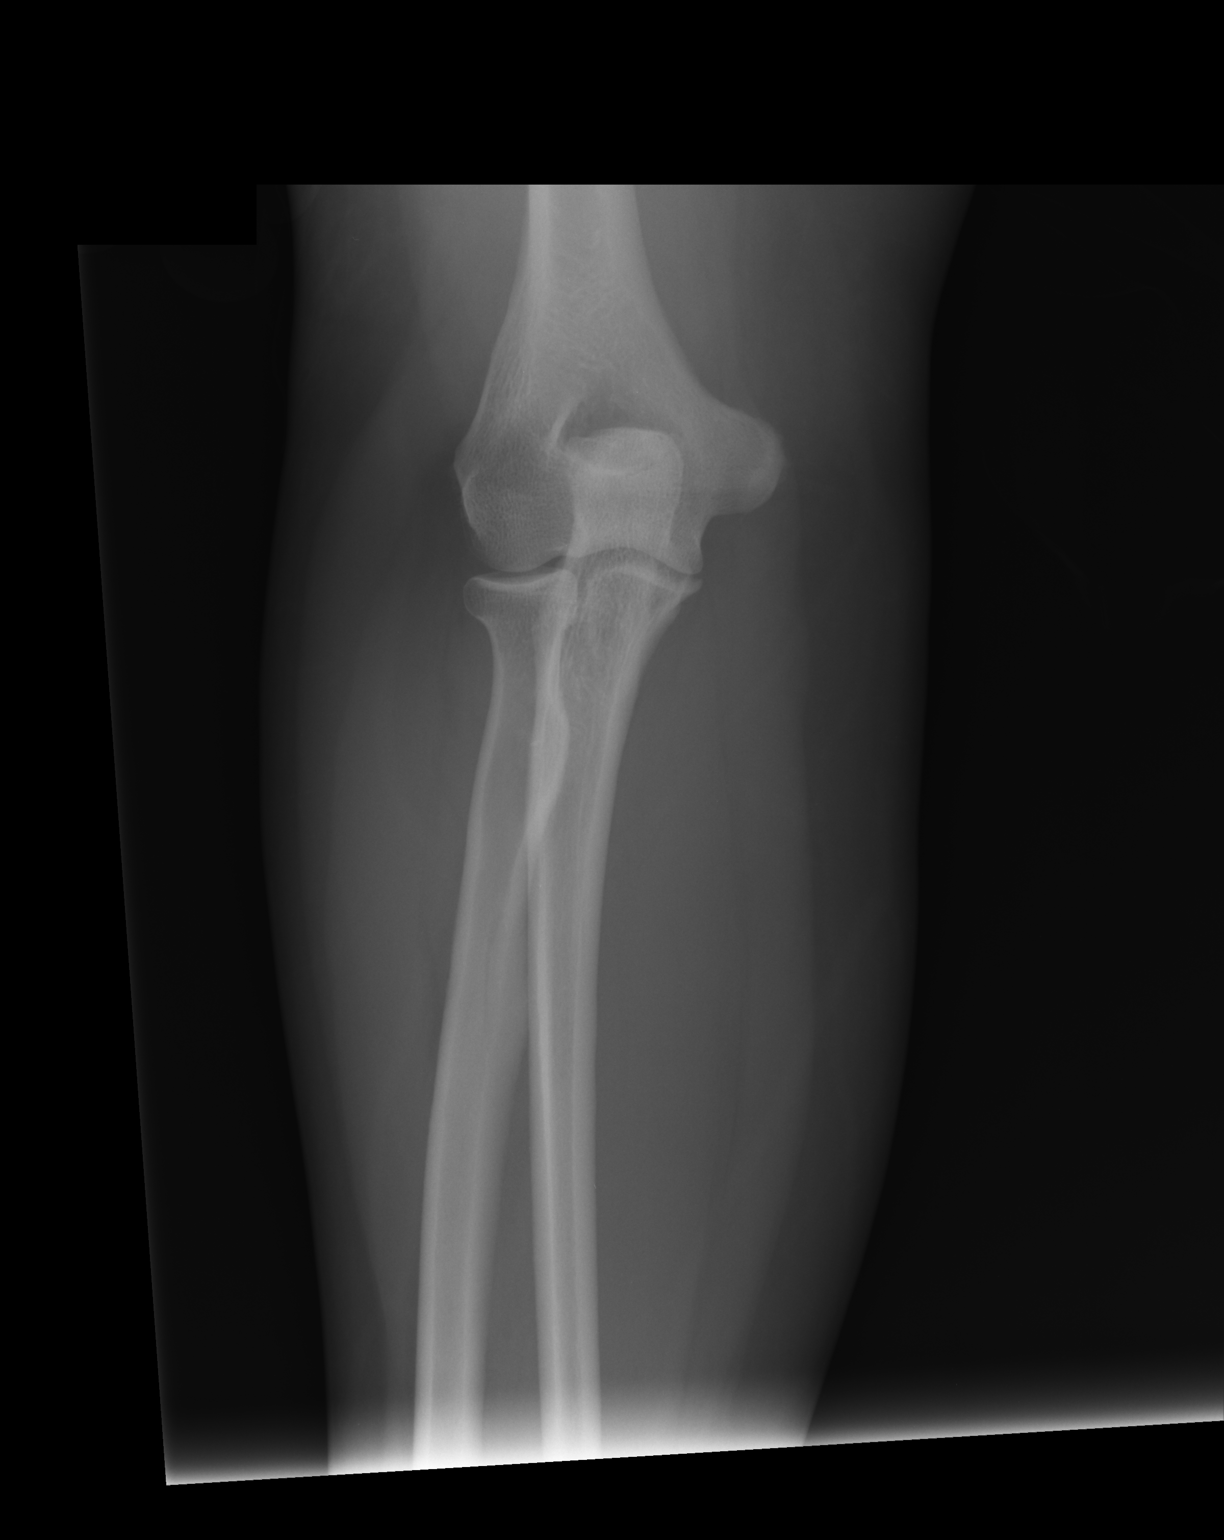

[lateral (1 of 2)]
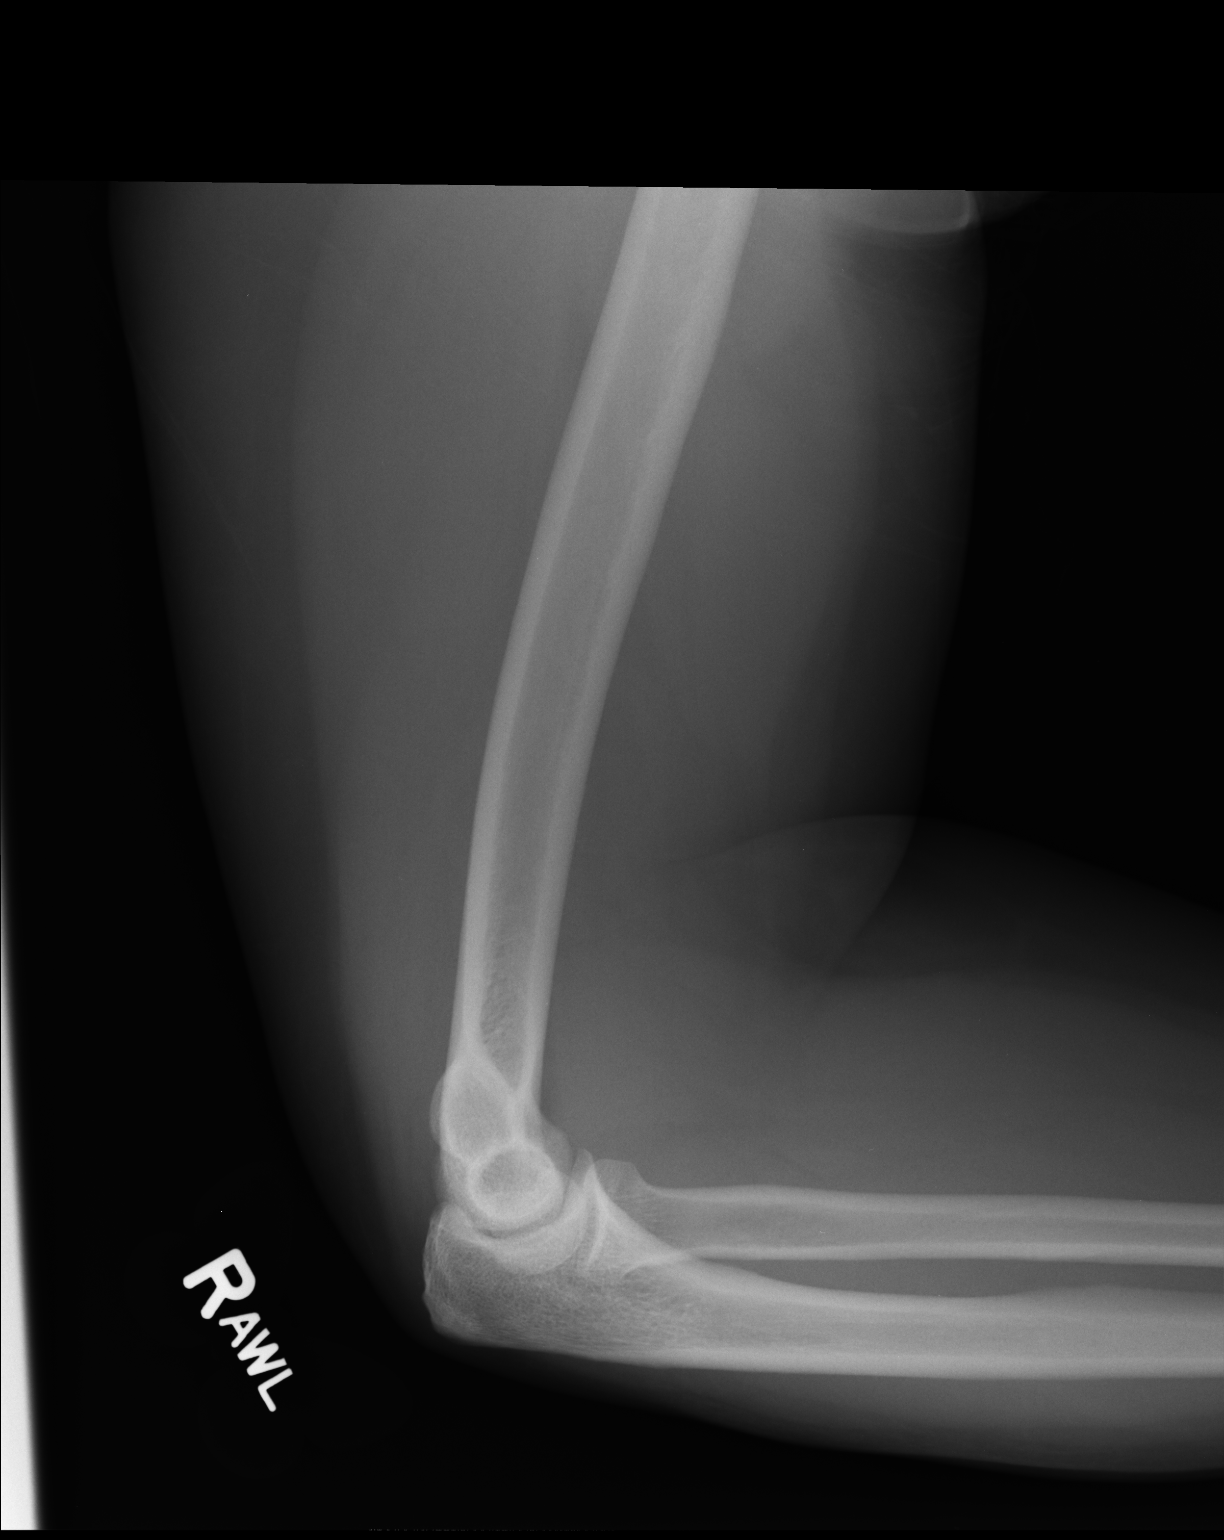

[lateral (2 of 2)]
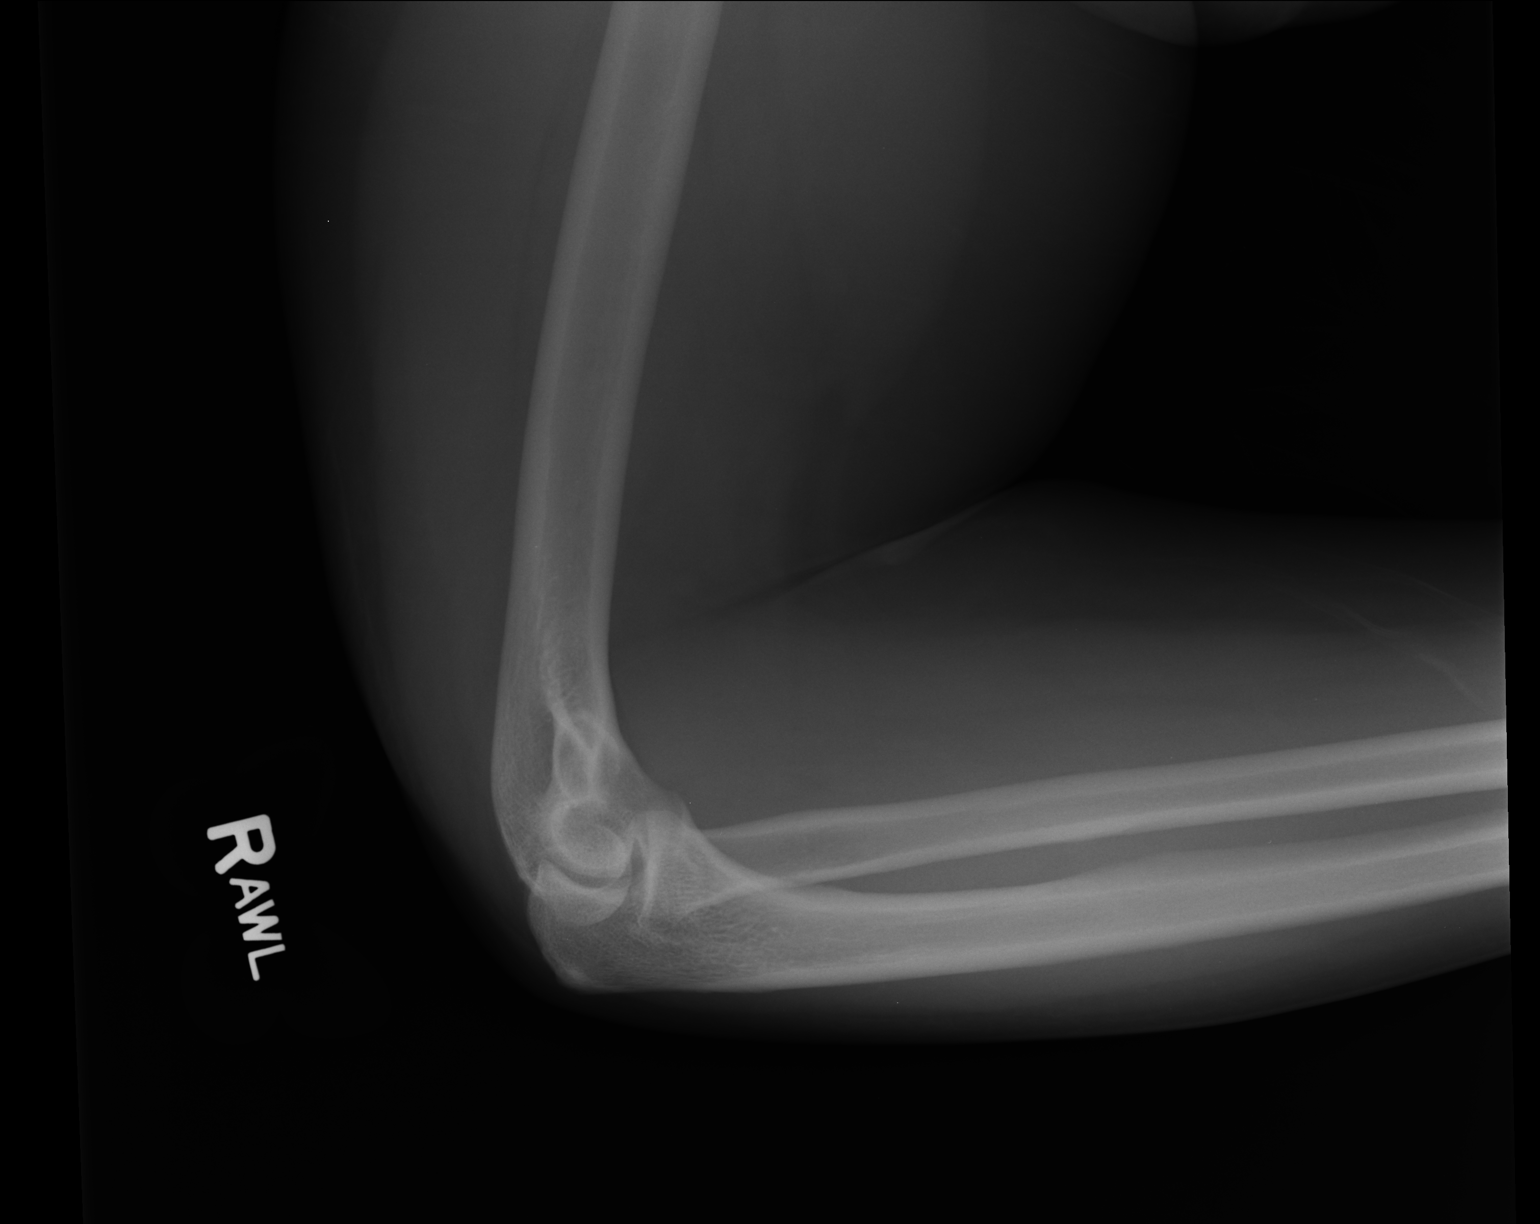

[3 of 3 positions shown; findings below may reference images not displayed]

FINDINGS: Bone mineralization normal.
Joint spaces preserved.
No fracture, dislocation, or bone destruction.
No joint effusion.
IMPRESSION: No acute osseous abnormalities.

## 2015-04-24 IMAGING — CR DG HUMERUS 2V *R*
2 series · 2 of 2 positions shown · non-contrast
Comparison: None

CLINICAL DATA: Right side pain, fell down steps

RIGHT HUMERUS - 2+ VIEW

[AP]
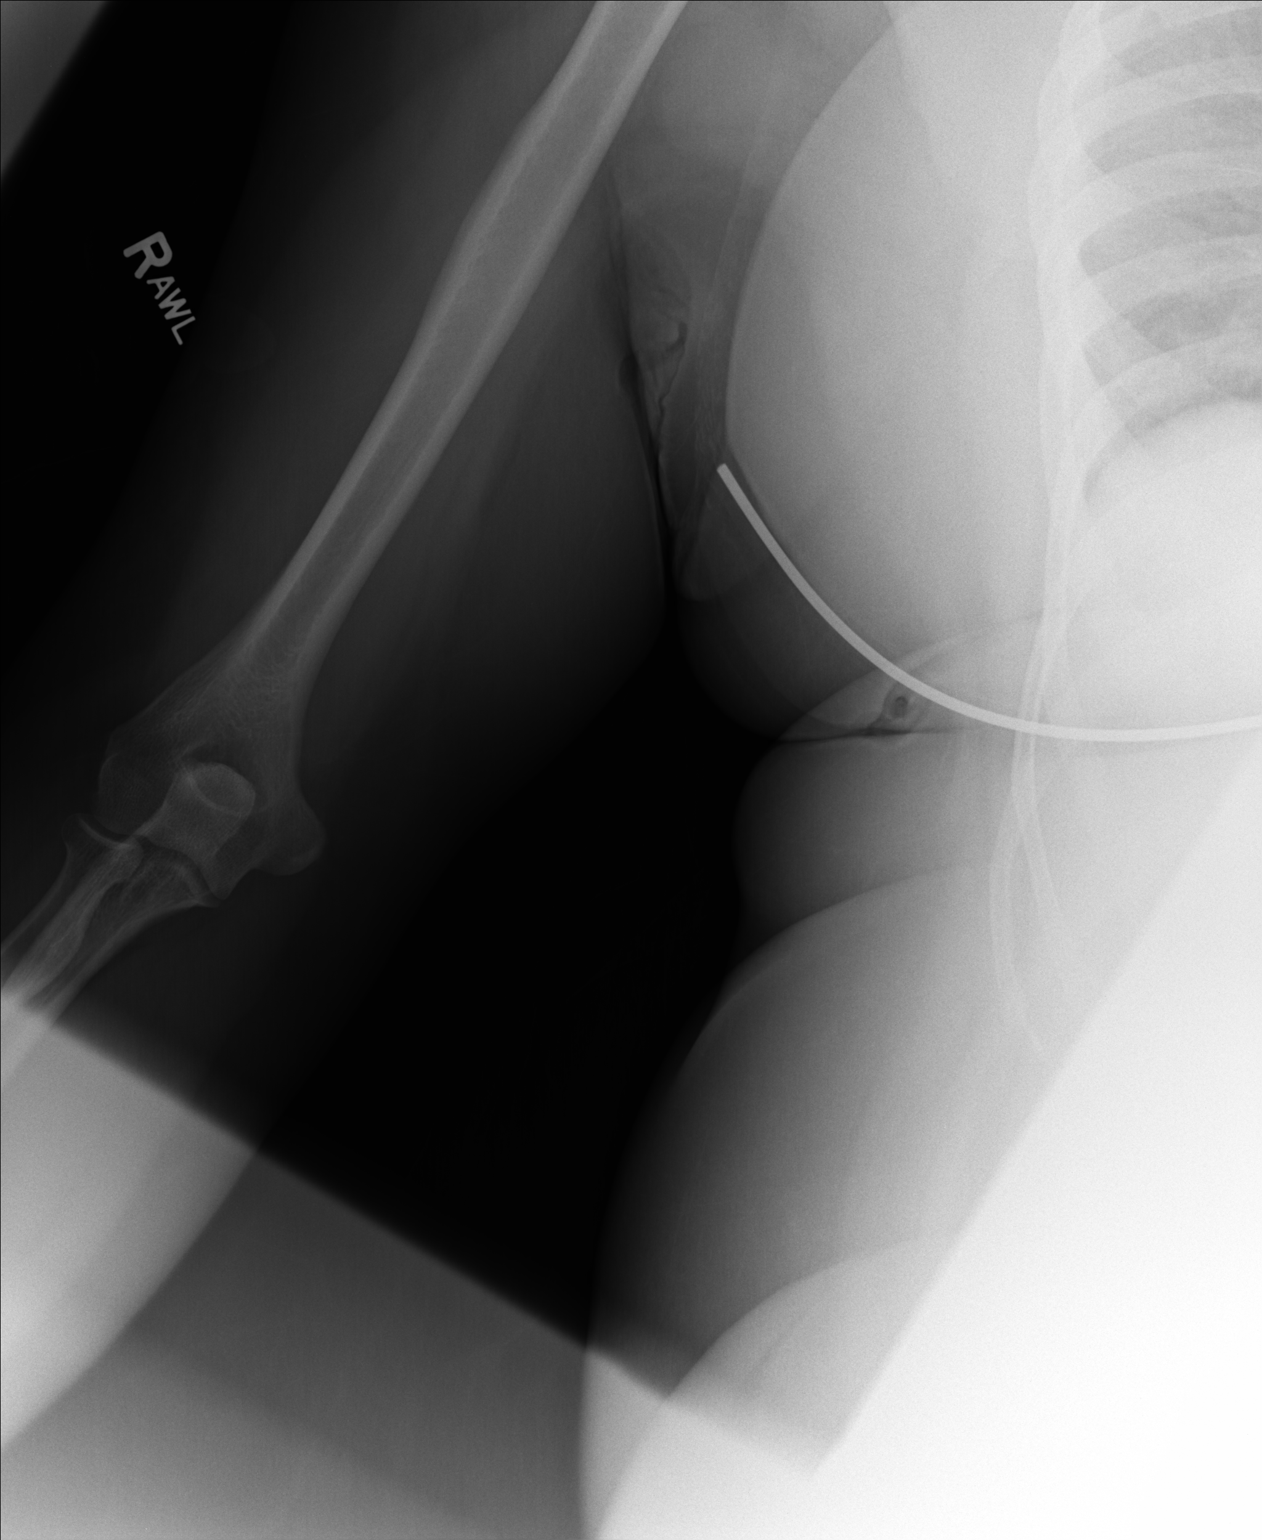

[lateral]
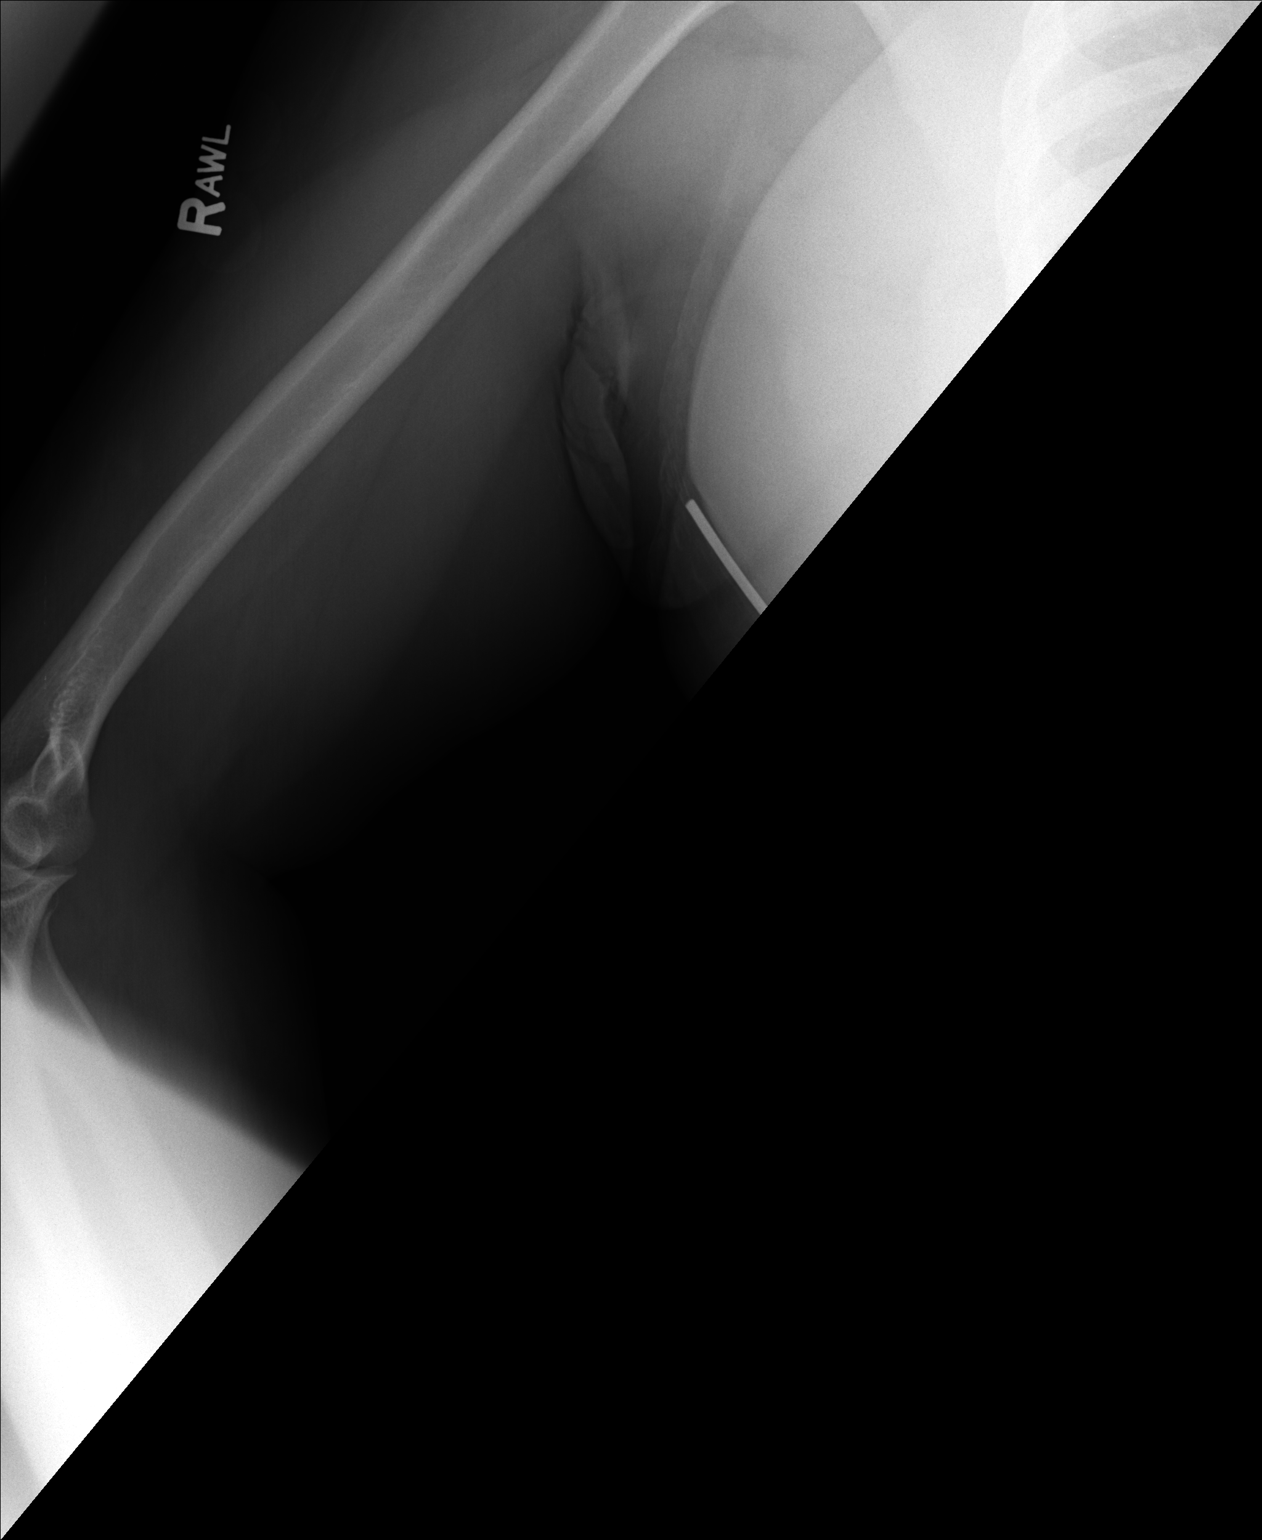

[2 of 2 positions shown; findings below may reference images not displayed]

FINDINGS: Shoulder reported separately.
No definite humeral fracture, dislocation or bone destruction.
IMPRESSION: No acute right humeral abnormalities.

## 2015-04-25 NOTE — Telephone Encounter (Signed)
I am working on completing the letter and form. I need to know what specific things she has tried so far to lose weight. For example, what diets has she tried? What types of exercises, and for how long?

## 2015-04-26 NOTE — Telephone Encounter (Signed)
Left message for pt to call back.   Ok to leave on my voicemail.

## 2015-04-27 NOTE — Telephone Encounter (Signed)
Pt states she has tried:  Weight Watchers Nutri-system Slim Fast Low Carb diet Low calorie diet  Exercise was regular until she was diagnosed with Rheumatoid arthritis. She walks three times a week.The RH does make it difficult to exercise.

## 2015-04-28 NOTE — Telephone Encounter (Signed)
I have written and printed the letter, which I will sign and place in the box at the nurses' desk. I have completed the form, but only have weights back 4 years, and need her paper record to document a weight for late 2012.  Please pull that paper record and pace it in my box.

## 2015-05-07 NOTE — Telephone Encounter (Signed)
Will place chart [id number] in Chelle's box. Not sure if she still needs it since it was from last week.

## 2015-05-08 NOTE — Telephone Encounter (Signed)
Paper chart obtained and 5 years of weight/BMI data documented on form. Form signed and returned to nurses' box at the desk.  Letter of medical necessity is also written and in the record-may still need to be sent to [facility].

## 2015-05-10 NOTE — Telephone Encounter (Signed)
Awaiting signature.

## 2015-05-12 NOTE — Telephone Encounter (Signed)
Faxed to [facility] today.

## 2015-05-14 NOTE — Telephone Encounter (Signed)
Spoke with pt, advised paperwork sent.

## 2015-05-17 ENCOUNTER — Encounter: Payer: BC Managed Care – PPO | Attending: General Surgery

## 2015-05-17 VITALS — Ht 66.0 in | Wt 277.5 lb

## 2015-05-17 DIAGNOSIS — Z6841 Body Mass Index (BMI) 40.0 and over, adult: Secondary | ICD-10-CM | POA: Diagnosis not present

## 2015-05-17 DIAGNOSIS — Z713 Dietary counseling and surveillance: Secondary | ICD-10-CM | POA: Insufficient documentation

## 2015-05-18 NOTE — Progress Notes (Signed)
  Pre-Operative Nutrition Class:  Appt start time: 2355   End time:  1830.  Patient was seen on 05/17/15 for Pre-Operative Bariatric Surgery Education at [facility].   Surgery date: 05/31/15 Surgery type: Sleeve gastrectomy Start weight at [facility]: 277.5 lbs on 04/08/2015 Weight today: 277.5 lbs  TANITA  BODY COMP RESULTS  05/18/15   BMI (kg/m^2) 44.8   Fat Mass (lbs) 143.5   Fat Free Mass (lbs) 134   Total Body Water (lbs) 98   Samples given per MNT protocol. Patient educated on appropriate usage: Premier protein shake (strawberry - qty 1) Lot #: [id number] Exp: 02/2016  Unjury protein powder (chicken soup - qty 1) Lot #: [id number] Exp: 04/2016  Celebrate Vitamins Multivitamin chew (berry - qty 1) Lot #: [id number] Exp: 08/2016  PB2 (qty 1) Lot #: [id number] Exp: 03/2017  The following the learning objectives were met by the patient during this course:  Identify Pre-Op Dietary Goals and will begin 2 weeks pre-operatively  Identify appropriate sources of fluids and proteins   State protein recommendations and appropriate sources pre and post-operatively  Identify Post-Operative Dietary Goals and will follow for 2 weeks post-operatively  Identify appropriate multivitamin and calcium sources  Describe the need for physical activity post-operatively and will follow MD recommendations  State when to call healthcare provider regarding medication questions or post-operative complications  Handouts given during class include:  Pre-Op Bariatric Surgery Diet Handout  Protein Shake Handout  Post-Op Bariatric Surgery Nutrition Handout  [facility] Program Information Flyer  Support Group Information Flyer  [facility] Bariatric Supplements Price List  Follow-Up Plan: Patient will follow-up at [facility] 2 weeks post operatively for diet advancement per MD.

## 2015-05-20 ENCOUNTER — Ambulatory Visit: Payer: Self-pay | Admitting: General Surgery

## 2015-05-20 NOTE — H&P (Signed)
Patient words: pre-op.   HPI The patient is a 54 year old female who presents for a bariatric surgery evaluation. 54 year old female presents for preoperative visit for laparoscopic sleeve gastrectomy. She has undergone psychological evaluation as well as dietary evaluation. She understands the changes expected with the sleeve gastrectomy and is prepared for them. She has lost 7 pounds since initial evaluation. She had questions as to whether she should take Humira tomorrow which she takes every 2 weeks. I advised her to go ahead and take it tomorrow but it would not take in 2 weeks. She notes that while being on the clear liquids she has been a little more constipated and is taking Colace over-the-counter.   Allergies Laurie Hodge, CMA; 05/20/2015 4:30 PM) Adhesive Tape 1"x5yd *MEDICAL DEVICES AND SUPPLIES* Rash.  Medication History Laurie Hodge, CMA; 05/20/2015 4:30 PM) Lisinopril (20MG  Tablet, Oral) Active. Atorvastatin Calcium (20MG  Tablet, Oral) Active. Humira Pen (40MG /0.8ML Pen-inj Kit, Subcutaneous) Active. Hydrochlorothiazide (12.5MG  Capsule, Oral) Active. Meloxicam (7.5MG  Tablet, Oral) Active. Omeprazole (40MG  Capsule DR, Oral) Active. Pantoprazole Sodium (40MG  Tablet DR, Oral) Active. Krill Oil (450MG  Capsule, Oral) Active. Vitamin D3 (2000UNIT Tablet, Oral) Active. ZyrTEC Allergy (10MG  Capsule, Oral) Active. Ferrex 150 (150MG  Capsule, Oral two times daily) Active. Medications Reconciled    Review of Systems Laurie Spruce MD; 05/20/2015 4:45 PM) General Not Present- Anorexia, Appetite Loss and Chills. Skin Not Present- Bruising, Change in Wart/Mole and Coarse Hair. HEENT Not Present- Blurred Vision, Color Blindness and Headache. Neck Not Present- Neck Pain and Neck Stiffness. Respiratory Not Present- Cough, Difficulty Breathing and Difficulty Breathing on Exertion. Cardiovascular Not Present- Chest Pain and Claudications. Gastrointestinal Present-  Constipation. Not Present- Abdominal Pain, Diarrhea, Nausea and Vomiting. Musculoskeletal Present- Joint Pain. Not Present- Back Pain, Joint Redness and Joint Stiffness. Neurological Not Present- Difficulty Speaking, Dizziness and Fainting. Psychiatric Not Present- Anxiety and Depression. Endocrine Not Present- Cold Intolerance and Decreased Sweating. Hematology Not Present- Anemia, Blood Clots, Easy Bleeding and Easy Bruising.  Vitals (Laurie Hodge CMA; 05/20/2015 4:29 PM) 05/20/2015 4:29 PM Weight: 270.4 lb Height: 66in Body Surface Area: 2.27 m Body Mass Index: 43.64 kg/m  BP: 164/100 (Sitting, Left Arm, Standard)       Physical Exam Laurie Spruce MD; 05/20/2015 4:45 PM) General Mental Status-Alert. General Appearance-Cooperative. Orientation-Oriented X4. Build & Nutrition-Obese. Posture-Normal posture.  Integumentary Global Assessment Upon inspection and palpation of skin surfaces of the - Head/Face: no rashes, ulcers, lesions or evidence of photo damage. No palpable nodules or masses and Neck: no visible lesions or palpable masses.  Head and Neck Head-normocephalic, atraumatic with no lesions or palpable masses. Face Global Assessment - atraumatic. Thyroid Gland Characteristics - normal size and consistency.  Eye Eyeball - Bilateral-Extraocular movements intact. Sclera/Conjunctiva - Bilateral-No scleral icterus, No Discharge.  ENMT Nose and Sinuses External Inspection of the Nose - no deformities observed, no swelling present.  Chest and Lung Exam Palpation Palpation of the chest reveals - Non-tender. Auscultation Breath sounds - Normal.  Cardiovascular Auscultation Rhythm - Regular. Heart Sounds - S1 WNL and S2 WNL. Carotid arteries - No Carotid bruit.  Abdomen Inspection Inspection of the abdomen reveals - No Visible peristalsis, No Abnormal pulsations and No Paradoxical movements. Note: small periumbilical  scar. Palpation/Percussion Palpation and Percussion of the abdomen reveal - Soft, Non Tender, No Rebound tenderness, No Rigidity (guarding), No hepatosplenomegaly and No Palpable abdominal masses.  Peripheral Vascular Upper Extremity Palpation - Pulses bilaterally normal. Lower Extremity Palpation - Edema - Bilateral - No edema.  Neurologic Neurologic evaluation reveals -normal sensation and normal coordination.  Neuropsychiatric Mental status exam performed with findings of-able to articulate well with normal speech/language, rate, volume and coherence and thought content normal with ability to perform basic computations and apply abstract reasoning.  Musculoskeletal Normal Exam - Bilateral-Upper Extremity Strength Normal and Lower Extremity Strength Normal.    Assessment & Plan Laurie Spruce MD; 05/20/2015 4:49 PM) MORBID OBESITY, UNSPECIFIED OBESITY TYPE (E66.01) Impression: 54 year old female who presents for preoperative evaluation before sleeve gastrectomy. We reviewed the surgery in detail including but not limited to general anesthesia, laparoscopic procedure with possibility of open procedure, risk of blood clot, leak, incisional hernia, reflux, dysphagia, stenosis, and respiratory or cardiac event. She showed understanding and is excited to proceed. -lap sleeve Current Plans Started OxyCODONE HCl $RemoveBefor'5MG'StvMhNGRwinh$ /5ML, 5-10 Milliliter every four hours, as needed, 200 Milliliter, 05/20/2015, No Refill. Started Ondansetron HCl $RemoveBeforeD'4MG'KDShvTGkPazcTu$ , 1 (one) Tablet every six hours, as needed for nausea, #15, 05/20/2015, No Refill. 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

## 2015-05-25 NOTE — Progress Notes (Signed)
Chest xray and ekg 03-22-15 epic Echo 10-22-13 epic

## 2015-05-25 NOTE — Patient Instructions (Addendum)
Laurie Hodge  05/25/2015   Your procedure is scheduled on: 05-31-15  Report to [name] Main  Entrance take [facility]  elevators to 3rd floor to  [name] at 750 AM.  Call this number if you have problems the morning of surgery [phone]   Remember: ONLY 1 PERSON MAY GO WITH YOU TO SHORT STAY TO GET  READY MORNING OF [name].  Do not eat food or drink liquids :After Midnight.  Bring cpap mask and tubing   Take these medicines the morning of surgery with A SIP OF WATER:  Omeprazole (Prilosec) DO NOT TAKE ANY DIABETIC MEDICATIONS DAY OF YOUR SURGERY                               You may not have any metal on your body including hair pins and              piercings  Do not wear jewelry, make-up, lotions, powders or perfumes, deodorant             Do not wear nail polish.  Do not shave  48 hours prior to surgery.              Men may shave face and neck.   Do not bring valuables to the hospital. [name].  Contacts, dentures or bridgework may not be worn into surgery.  Leave suitcase in the car. After surgery it may be brought to your room.     Patients discharged the day of surgery will not be allowed to drive home.  Name and phone number of your driver:  Special Instructions: N/A              Please read over the following fact sheets you were given: _____________________________________________________________________             [name] - Preparing for Surgery Before surgery, you can play an important role.  Because skin is not sterile, your skin needs to be as free of germs as possible.  You can reduce the number of germs on your skin by washing with CHG (chlorahexidine gluconate) soap before surgery.  CHG is an antiseptic cleaner which kills germs and bonds with the skin to continue killing germs even after washing. Please DO NOT use if you have an allergy to CHG or antibacterial  soaps.  If your skin becomes reddened/irritated stop using the CHG and inform your nurse when you arrive at Short Stay. Do not shave (including legs and underarms) for at least 48 hours prior to the first CHG shower.  You may shave your face/neck. Please follow these instructions carefully:  1.  Shower with CHG Soap the night before surgery and the  morning of Surgery.  2.  If you choose to wash your hair, wash your hair first as usual with your  normal  shampoo.  3.  After you shampoo, rinse your hair and body thoroughly to remove the  shampoo.                           4.  Use CHG as you would any other liquid soap.  You can apply chg directly  to  the skin and wash                       Gently with a scrungie or clean washcloth.  5.  Apply the CHG Soap to your body ONLY FROM THE NECK DOWN.   Do not use on face/ open                           Wound or open sores. Avoid contact with eyes, ears mouth and genitals (private parts).                       Wash face,  Genitals (private parts) with your normal soap.             6.  Wash thoroughly, paying special attention to the area where your surgery  will be performed.  7.  Thoroughly rinse your body with warm water from the neck down.  8.  DO NOT shower/wash with your normal soap after using and rinsing off  the CHG Soap.                9.  Pat yourself dry with a clean towel.            10.  Wear clean pajamas.            11.  Place clean sheets on your bed the night of your first shower and do not  sleep with pets. Day of Surgery : Do not apply any lotions/deodorants the morning of surgery.  Please wear clean clothes to the hospital/surgery center.  FAILURE TO FOLLOW THESE INSTRUCTIONS MAY RESULT IN THE CANCELLATION OF YOUR SURGERY PATIENT SIGNATURE_________________________________  NURSE SIGNATURE__________________________________  ________________________________________________________________________

## 2015-05-26 ENCOUNTER — Encounter (HOSPITAL_COMMUNITY): Payer: Self-pay

## 2015-05-26 ENCOUNTER — Telehealth: Payer: Self-pay

## 2015-05-26 ENCOUNTER — Encounter (HOSPITAL_COMMUNITY)
Admission: RE | Admit: 2015-05-26 | Discharge: 2015-05-26 | Disposition: A | Discharge status: Home / Self Care | Payer: BC Managed Care – PPO | Source: Ambulatory Visit | Attending: General Surgery | Admitting: General Surgery

## 2015-05-26 DIAGNOSIS — Z01818 Encounter for other preprocedural examination: Secondary | ICD-10-CM | POA: Diagnosis present

## 2015-05-26 LAB — CBC WITH DIFFERENTIAL/PLATELET
Basophils Absolute: 0 10*3/uL (ref 0.0–0.1)
Basophils Relative: 0 %
EOS ABS: 0.1 10*3/uL (ref 0.0–0.7)
EOS PCT: 2 %
HCT: 37.9 % (ref 36.0–46.0)
Hemoglobin: 11.6 g/dL — ABNORMAL LOW (ref 12.0–15.0)
LYMPHS ABS: 1.9 10*3/uL (ref 0.7–4.0)
LYMPHS PCT: 35 %
MCH: 25.1 pg — AB (ref 26.0–34.0)
MCHC: 30.6 g/dL (ref 30.0–36.0)
MCV: 81.9 fL (ref 78.0–100.0)
MONO ABS: 0.4 10*3/uL (ref 0.1–1.0)
Monocytes Relative: 7 %
Neutro Abs: 3 10*3/uL (ref 1.7–7.7)
Neutrophils Relative %: 56 %
PLATELETS: 370 10*3/uL (ref 150–400)
RBC: 4.63 MIL/uL (ref 3.87–5.11)
RDW: 15.4 % (ref 11.5–15.5)
WBC: 5.4 10*3/uL (ref 4.0–10.5)

## 2015-05-26 LAB — COMPREHENSIVE METABOLIC PANEL
ALT: 26 U/L (ref 14–54)
ANION GAP: 8 (ref 5–15)
AST: 24 U/L (ref 15–41)
Albumin: 4.2 g/dL (ref 3.5–5.0)
Alkaline Phosphatase: 59 U/L (ref 38–126)
BUN: 16 mg/dL (ref 6–20)
CHLORIDE: 103 mmol/L (ref 101–111)
CO2: 28 mmol/L (ref 22–32)
Calcium: 9.4 mg/dL (ref 8.9–10.3)
Creatinine, Ser: 0.73 mg/dL (ref 0.44–1.00)
Glucose, Bld: 93 mg/dL (ref 65–99)
POTASSIUM: 3.9 mmol/L (ref 3.5–5.1)
SODIUM: 139 mmol/L (ref 135–145)
Total Bilirubin: 0.7 mg/dL (ref 0.3–1.2)
Total Protein: 8.2 g/dL — ABNORMAL HIGH (ref 6.5–8.1)

## 2015-05-26 LAB — NO BLOOD PRODUCTS

## 2015-05-26 NOTE — Telephone Encounter (Signed)
Laurie Hodge   Patient is having surgery on Monday.  The omeprazole (PRILOSEC) 40 MG capsule Is too large to swallow./  She is asking if you can call in something smaller for her to take.  [facility] on [address]  [phone]

## 2015-05-26 NOTE — Progress Notes (Signed)
Blood refusal form faxed to [facility] and dr Kieth Brightly office, fax confirmation received and placed on pt chart

## 2015-05-26 NOTE — Progress Notes (Signed)
Cbc with dif results routed to dr Kieth Brightly inbasket by epic

## 2015-05-26 NOTE — Telephone Encounter (Signed)
I am happy to switch her to a smaller product, or a slurry or dissolvable tablet.  It is also possible that the capsule can be opened and the contents sprinkled on a spoonful of applesauce.  Please contact her pharmacist for advice.

## 2015-05-27 NOTE — Telephone Encounter (Signed)
Left message for patient to call us back.  

## 2015-05-30 NOTE — Telephone Encounter (Signed)
I have advised her to open the capsule and sprinkle/ she is very pleased with this answer since she has a 90 d supply, she will let us know if she needs anything further.

## 2015-05-31 ENCOUNTER — Inpatient Hospital Stay (HOSPITAL_COMMUNITY): Payer: BC Managed Care – PPO | Admitting: Certified Registered Nurse Anesthetist

## 2015-05-31 ENCOUNTER — Encounter (HOSPITAL_COMMUNITY)
Admission: RE | Disposition: A | Discharge status: Home / Self Care | Payer: Self-pay | Source: Ambulatory Visit | Attending: General Surgery

## 2015-05-31 ENCOUNTER — Encounter (HOSPITAL_COMMUNITY): Payer: Self-pay

## 2015-05-31 ENCOUNTER — Inpatient Hospital Stay (HOSPITAL_COMMUNITY)
Admission: RE | Admit: 2015-05-31 | Discharge: 2015-06-02 | DRG: 621 | Disposition: A | Discharge status: Home / Self Care | Payer: BC Managed Care – PPO | Source: Ambulatory Visit | Attending: General Surgery | Admitting: General Surgery

## 2015-05-31 DIAGNOSIS — K219 Gastro-esophageal reflux disease without esophagitis: Secondary | ICD-10-CM | POA: Diagnosis present

## 2015-05-31 DIAGNOSIS — Z6841 Body Mass Index (BMI) 40.0 and over, adult: Secondary | ICD-10-CM | POA: Diagnosis not present

## 2015-05-31 DIAGNOSIS — Z01812 Encounter for preprocedural laboratory examination: Secondary | ICD-10-CM

## 2015-05-31 DIAGNOSIS — E785 Hyperlipidemia, unspecified: Secondary | ICD-10-CM | POA: Diagnosis present

## 2015-05-31 DIAGNOSIS — G4733 Obstructive sleep apnea (adult) (pediatric): Secondary | ICD-10-CM | POA: Diagnosis present

## 2015-05-31 DIAGNOSIS — I10 Essential (primary) hypertension: Secondary | ICD-10-CM | POA: Diagnosis present

## 2015-05-31 HISTORY — PX: LAPAROSCOPIC GASTRIC SLEEVE RESECTION: SHX5895

## 2015-05-31 LAB — PREGNANCY, URINE: Preg Test, Ur: NEGATIVE

## 2015-05-31 LAB — CREATININE, SERUM: CREATININE: 0.82 mg/dL (ref 0.44–1.00)

## 2015-05-31 LAB — CBC
HEMATOCRIT: 34.9 % — AB (ref 36.0–46.0)
HEMOGLOBIN: 10.8 g/dL — AB (ref 12.0–15.0)
MCH: 25.2 pg — ABNORMAL LOW (ref 26.0–34.0)
MCHC: 30.9 g/dL (ref 30.0–36.0)
MCV: 81.4 fL (ref 78.0–100.0)
Platelets: 348 10*3/uL (ref 150–400)
RBC: 4.29 MIL/uL (ref 3.87–5.11)
RDW: 14.9 % (ref 11.5–15.5)
WBC: 15.9 10*3/uL — AB (ref 4.0–10.5)

## 2015-05-31 LAB — HEMOGLOBIN AND HEMATOCRIT, BLOOD
HEMATOCRIT: 35.5 % — AB (ref 36.0–46.0)
Hemoglobin: 10.9 g/dL — ABNORMAL LOW (ref 12.0–15.0)

## 2015-05-31 SURGERY — GASTRECTOMY, SLEEVE, LAPAROSCOPIC
Anesthesia: General | Site: Abdomen

## 2015-05-31 MED ORDER — PANTOPRAZOLE SODIUM 40 MG IV SOLR
40.0000 mg | Freq: Every day | INTRAVENOUS | Status: DC
  Administered 2015-05-31 – 2015-06-01 (×2): 40 mg via INTRAVENOUS
  Filled 2015-05-31 (×3): qty 40

## 2015-05-31 MED ORDER — ONDANSETRON HCL 4 MG/2ML IJ SOLN
4.0000 mg | INTRAMUSCULAR | Status: DC | PRN
  Administered 2015-05-31 – 2015-06-01 (×4): 4 mg via INTRAVENOUS
  Filled 2015-05-31 (×4): qty 2

## 2015-05-31 MED ORDER — LACTATED RINGERS IR SOLN
Status: DC | PRN
  Administered 2015-05-31: 1000 mL

## 2015-05-31 MED ORDER — ROCURONIUM BROMIDE 100 MG/10ML IV SOLN
INTRAVENOUS | Status: DC | PRN
  Administered 2015-05-31: 10 mg via INTRAVENOUS
  Administered 2015-05-31: 40 mg via INTRAVENOUS

## 2015-05-31 MED ORDER — CHLORHEXIDINE GLUCONATE 4 % EX LIQD: 60.0000 mL | Freq: Once | CUTANEOUS | Status: DC

## 2015-05-31 MED ORDER — PROPOFOL 10 MG/ML IV BOLUS
INTRAVENOUS | Status: AC
  Filled 2015-05-31: qty 20

## 2015-05-31 MED ORDER — LIDOCAINE HCL (CARDIAC) 20 MG/ML IV SOLN
INTRAVENOUS | Status: DC | PRN
  Administered 2015-05-31 (×2): 50 mg via INTRAVENOUS

## 2015-05-31 MED ORDER — SUGAMMADEX SODIUM 500 MG/5ML IV SOLN
INTRAVENOUS | Status: DC | PRN
  Administered 2015-05-31 (×2): 250 mg via INTRAVENOUS

## 2015-05-31 MED ORDER — FENTANYL CITRATE (PF) 100 MCG/2ML IJ SOLN
INTRAMUSCULAR | Status: DC | PRN
  Administered 2015-05-31: 25 ug via INTRAVENOUS
  Administered 2015-05-31 (×2): 50 ug via INTRAVENOUS
  Administered 2015-05-31: 25 ug via INTRAVENOUS
  Administered 2015-05-31 (×2): 50 ug via INTRAVENOUS
  Administered 2015-05-31: 25 ug via INTRAVENOUS
  Administered 2015-05-31: 50 ug via INTRAVENOUS
  Administered 2015-05-31: 25 ug via INTRAVENOUS

## 2015-05-31 MED ORDER — MIDAZOLAM HCL 5 MG/5ML IJ SOLN
INTRAMUSCULAR | Status: DC | PRN
  Administered 2015-05-31 (×2): 1 mg via INTRAVENOUS

## 2015-05-31 MED ORDER — SODIUM CHLORIDE 0.9 % IJ SOLN
INTRAMUSCULAR | Status: AC
  Filled 2015-05-31: qty 10

## 2015-05-31 MED ORDER — ONDANSETRON HCL 4 MG/2ML IJ SOLN
INTRAMUSCULAR | Status: DC | PRN
  Administered 2015-05-31: 4 mg via INTRAVENOUS

## 2015-05-31 MED ORDER — DEXAMETHASONE SODIUM PHOSPHATE 4 MG/ML IJ SOLN
INTRAMUSCULAR | Status: DC | PRN
  Administered 2015-05-31: 10 mg via INTRAVENOUS

## 2015-05-31 MED ORDER — 0.9 % SODIUM CHLORIDE (POUR BTL) OPTIME
TOPICAL | Status: DC | PRN
  Administered 2015-05-31: 1000 mL

## 2015-05-31 MED ORDER — SUGAMMADEX SODIUM 500 MG/5ML IV SOLN
INTRAVENOUS | Status: AC
  Filled 2015-05-31: qty 5

## 2015-05-31 MED ORDER — OXYCODONE HCL 5 MG/5ML PO SOLN: 5.0000 mg | ORAL | Status: DC | PRN

## 2015-05-31 MED ORDER — BUPIVACAINE-EPINEPHRINE 0.25% -1:200000 IJ SOLN
INTRAMUSCULAR | Status: DC | PRN
  Administered 2015-05-31: 20 mL

## 2015-05-31 MED ORDER — FENTANYL CITRATE (PF) 250 MCG/5ML IJ SOLN
INTRAMUSCULAR | Status: AC
  Filled 2015-05-31: qty 5

## 2015-05-31 MED ORDER — ACETAMINOPHEN 10 MG/ML IV SOLN
1000.0000 mg | Freq: Four times a day (QID) | INTRAVENOUS | Status: AC
  Administered 2015-05-31 – 2015-06-01 (×4): 1000 mg via INTRAVENOUS
  Filled 2015-05-31 (×5): qty 100

## 2015-05-31 MED ORDER — DEXAMETHASONE SODIUM PHOSPHATE 10 MG/ML IJ SOLN
INTRAMUSCULAR | Status: AC
  Filled 2015-05-31: qty 1

## 2015-05-31 MED ORDER — LIDOCAINE HCL (CARDIAC) 20 MG/ML IV SOLN
INTRAVENOUS | Status: AC
  Filled 2015-05-31: qty 5

## 2015-05-31 MED ORDER — HYDROMORPHONE HCL 1 MG/ML IJ SOLN
0.2500 mg | INTRAMUSCULAR | Status: DC | PRN
  Administered 2015-05-31 (×4): 0.5 mg via INTRAVENOUS

## 2015-05-31 MED ORDER — METOCLOPRAMIDE HCL 5 MG/ML IJ SOLN
INTRAMUSCULAR | Status: AC
  Filled 2015-05-31: qty 2

## 2015-05-31 MED ORDER — DEXTROSE-NACL 5-0.45 % IV SOLN
INTRAVENOUS | Status: DC
  Administered 2015-05-31: 15:00:00 via INTRAVENOUS
  Administered 2015-05-31: 125 mL/h via INTRAVENOUS
  Administered 2015-06-01 (×2): via INTRAVENOUS
  Administered 2015-06-02: 125 mL/h via INTRAVENOUS

## 2015-05-31 MED ORDER — BUPIVACAINE-EPINEPHRINE (PF) 0.25% -1:200000 IJ SOLN
INTRAMUSCULAR | Status: AC
Start: 2015-05-31 — End: 2015-05-31
  Filled 2015-05-31: qty 30

## 2015-05-31 MED ORDER — ONDANSETRON HCL 4 MG/2ML IJ SOLN
INTRAMUSCULAR | Status: AC
  Filled 2015-05-31: qty 2

## 2015-05-31 MED ORDER — ACETAMINOPHEN 160 MG/5ML PO SOLN: 650.0000 mg | ORAL | Status: DC | PRN

## 2015-05-31 MED ORDER — EPHEDRINE SULFATE 50 MG/ML IJ SOLN
INTRAMUSCULAR | Status: AC
  Filled 2015-05-31: qty 1

## 2015-05-31 MED ORDER — KETAMINE HCL 10 MG/ML IJ SOLN
INTRAMUSCULAR | Status: DC | PRN
  Administered 2015-05-31: 20 mg via INTRAVENOUS

## 2015-05-31 MED ORDER — DEXTROSE 5 % IV SOLN
INTRAVENOUS | Status: AC
  Filled 2015-05-31: qty 2

## 2015-05-31 MED ORDER — PREMIER PROTEIN SHAKE
2.0000 [oz_av] | Freq: Four times a day (QID) | ORAL | Status: DC
  Administered 2015-06-02 (×2): 2 [oz_av] via ORAL

## 2015-05-31 MED ORDER — SUCCINYLCHOLINE CHLORIDE 20 MG/ML IJ SOLN
INTRAMUSCULAR | Status: DC | PRN
  Administered 2015-05-31: 140 mg via INTRAVENOUS

## 2015-05-31 MED ORDER — BUPIVACAINE LIPOSOME 1.3 % IJ SUSP
20.0000 mL | Freq: Once | INTRAMUSCULAR | Status: DC
  Filled 2015-05-31: qty 20

## 2015-05-31 MED ORDER — ACETAMINOPHEN 160 MG/5ML PO SOLN: 325.0000 mg | ORAL | Status: DC | PRN

## 2015-05-31 MED ORDER — LACTATED RINGERS IV SOLN: INTRAVENOUS | Status: DC

## 2015-05-31 MED ORDER — LACTATED RINGERS IV SOLN
INTRAVENOUS | Status: DC | PRN
  Administered 2015-05-31 (×2): via INTRAVENOUS

## 2015-05-31 MED ORDER — HYDROMORPHONE HCL 1 MG/ML IJ SOLN
INTRAMUSCULAR | Status: AC
  Filled 2015-05-31: qty 1

## 2015-05-31 MED ORDER — PROPOFOL 10 MG/ML IV BOLUS
INTRAVENOUS | Status: DC | PRN
  Administered 2015-05-31: 200 mg via INTRAVENOUS

## 2015-05-31 MED ORDER — DEXTROSE 5 % IV SOLN
2.0000 g | INTRAVENOUS | Status: AC
  Administered 2015-05-31: 2 g via INTRAVENOUS

## 2015-05-31 MED ORDER — MORPHINE SULFATE (PF) 2 MG/ML IV SOLN
2.0000 mg | INTRAVENOUS | Status: DC | PRN
  Administered 2015-06-01: 2 mg via INTRAVENOUS
  Administered 2015-06-01: 4 mg via INTRAVENOUS
  Administered 2015-06-01: 2 mg via INTRAVENOUS
  Filled 2015-05-31: qty 1
  Filled 2015-05-31: qty 2
  Filled 2015-05-31: qty 1

## 2015-05-31 MED ORDER — FENTANYL CITRATE (PF) 100 MCG/2ML IJ SOLN
INTRAMUSCULAR | Status: AC
  Filled 2015-05-31: qty 2

## 2015-05-31 MED ORDER — ROCURONIUM BROMIDE 100 MG/10ML IV SOLN
INTRAVENOUS | Status: AC
  Filled 2015-05-31: qty 1

## 2015-05-31 MED ORDER — HEPARIN SODIUM (PORCINE) 5000 UNIT/ML IJ SOLN
5000.0000 [IU] | Freq: Three times a day (TID) | INTRAMUSCULAR | Status: DC
  Administered 2015-05-31 – 2015-06-02 (×6): 5000 [IU] via SUBCUTANEOUS
  Filled 2015-05-31 (×9): qty 1

## 2015-05-31 MED ORDER — PROMETHAZINE HCL 25 MG/ML IJ SOLN
INTRAMUSCULAR | Status: AC
  Administered 2015-05-31: 12.5 mg
  Filled 2015-05-31: qty 1

## 2015-05-31 MED ORDER — METOCLOPRAMIDE HCL 5 MG/ML IJ SOLN
INTRAMUSCULAR | Status: DC | PRN
  Administered 2015-05-31: 10 mg via INTRAVENOUS

## 2015-05-31 MED ORDER — EPHEDRINE SULFATE 50 MG/ML IJ SOLN
INTRAMUSCULAR | Status: DC | PRN
  Administered 2015-05-31 (×2): 5 mg via INTRAVENOUS

## 2015-05-31 MED ORDER — MIDAZOLAM HCL 2 MG/2ML IJ SOLN
INTRAMUSCULAR | Status: AC
  Filled 2015-05-31: qty 2

## 2015-05-31 MED ORDER — HEPARIN SODIUM (PORCINE) 5000 UNIT/ML IJ SOLN
5000.0000 [IU] | INTRAMUSCULAR | Status: AC
  Administered 2015-05-31: 5000 [IU] via SUBCUTANEOUS
  Filled 2015-05-31: qty 1

## 2015-05-31 SURGICAL SUPPLY — 72 items
APPLIER CLIP 5 13 M/L LIGAMAX5 (MISCELLANEOUS)
APPLIER CLIP ROT 10 11.4 M/L (STAPLE) ×3
APPLIER CLIP ROT 13.4 12 LRG (CLIP)
APR CLP LRG 13.4X12 ROT 20 MLT (CLIP)
APR CLP MED LRG 11.4X10 (STAPLE) ×1
APR CLP MED LRG 5 ANG JAW (MISCELLANEOUS)
BLADE SURG 15 STRL LF DISP TIS (BLADE) ×1 IMPLANT
BLADE SURG 15 STRL SS (BLADE) ×3
CABLE HIGH FREQUENCY MONO STRZ (ELECTRODE) ×3 IMPLANT
CATH ROBINSON RED A/P 12FR (CATHETERS) IMPLANT
CHLORAPREP W/TINT 26ML (MISCELLANEOUS) ×6 IMPLANT
CLIP APPLIE 5 13 M/L LIGAMAX5 (MISCELLANEOUS) IMPLANT
CLIP APPLIE ROT 10 11.4 M/L (STAPLE) ×1 IMPLANT
CLIP APPLIE ROT 13.4 12 LRG (CLIP) IMPLANT
COVER SURGICAL LIGHT HANDLE (MISCELLANEOUS) ×3 IMPLANT
DRAIN CHANNEL 19F RND (DRAIN) IMPLANT
DRAPE CAMERA CLOSED 9X96 (DRAPES) ×3 IMPLANT
DRAPE UTILITY XL STRL (DRAPES) ×3 IMPLANT
ELECT L-HOOK LAP 45CM DISP (ELECTROSURGICAL)
ELECT PENCIL ROCKER SW 15FT (MISCELLANEOUS) ×2 IMPLANT
ELECT REM PT RETURN 9FT ADLT (ELECTROSURGICAL) ×3
ELECTRODE L-HOOK LAP 45CM DISP (ELECTROSURGICAL) IMPLANT
ELECTRODE REM PT RTRN 9FT ADLT (ELECTROSURGICAL) ×1 IMPLANT
ENDO GIA UNIVERSAL XLG (ENDOMECHANICALS) ×3 IMPLANT
EVACUATOR SILICONE 100CC (DRAIN) IMPLANT
GAUZE SPONGE 4X4 12PLY STRL (GAUZE/BANDAGES/DRESSINGS) IMPLANT
GLOVE BIOGEL PI IND STRL 6.5 (GLOVE) ×2 IMPLANT
GLOVE BIOGEL PI IND STRL 7.0 (GLOVE) ×2 IMPLANT
GLOVE BIOGEL PI IND STRL 7.5 (GLOVE) ×2 IMPLANT
GLOVE BIOGEL PI INDICATOR 6.5 (GLOVE) ×4
GLOVE BIOGEL PI INDICATOR 7.0 (GLOVE) ×4
GLOVE BIOGEL PI INDICATOR 7.5 (GLOVE) ×4
GLOVE ECLIPSE 7.5 STRL STRAW (GLOVE) ×2 IMPLANT
GLOVE SURG SS PI 7.0 STRL IVOR (GLOVE) ×2 IMPLANT
GOWN STRL REUS W/TWL XL LVL3 (GOWN DISPOSABLE) ×13 IMPLANT
HANDLE STAPLE EGIA 4 XL (STAPLE) ×3 IMPLANT
HOVERMATT SINGLE USE (MISCELLANEOUS) ×3 IMPLANT
KIT BASIN OR (CUSTOM PROCEDURE TRAY) ×3 IMPLANT
LIQUID BAND (GAUZE/BANDAGES/DRESSINGS) ×3 IMPLANT
NDL SPNL 22GX3.5 QUINCKE BK (NEEDLE) ×1 IMPLANT
NEEDLE SPNL 22GX3.5 QUINCKE BK (NEEDLE) ×3 IMPLANT
PACK UNIVERSAL I (CUSTOM PROCEDURE TRAY) ×3 IMPLANT
PEN SKIN MARKING BROAD (MISCELLANEOUS) ×3 IMPLANT
POUCH ENDO CATCH II 15MM (MISCELLANEOUS) ×3 IMPLANT
RELOAD EGIA 45 MED/THCK PURPLE (STAPLE) IMPLANT
RELOAD STAPLER 60MM BLK (STAPLE) ×3 IMPLANT
RELOAD TRI 2.0 60 XTHK VAS SUL (STAPLE) IMPLANT
RELOAD TRI 60 ART MED THCK BLK (STAPLE) ×6 IMPLANT
RELOAD TRI 60 ART MED THCK PUR (STAPLE) ×9 IMPLANT
SCISSORS LAP 5X45 EPIX DISP (ENDOMECHANICALS) ×3 IMPLANT
SET IRRIG TUBING LAPAROSCOPIC (IRRIGATION / IRRIGATOR) ×3 IMPLANT
SHEARS HARMONIC ACE PLUS 45CM (MISCELLANEOUS) ×3 IMPLANT
SLEEVE GASTRECTOMY 40FR VISIGI (MISCELLANEOUS) ×3 IMPLANT
SLEEVE XCEL OPT CAN 5 100 (ENDOMECHANICALS) ×6 IMPLANT
SOLUTION ANTI FOG 6CC (MISCELLANEOUS) ×3 IMPLANT
SPONGE LAP 18X18 X RAY DECT (DISPOSABLE) ×3 IMPLANT
STAPLER RELOAD 60MM BLK (STAPLE) ×9
SUT ETHILON 2 0 PS N (SUTURE) IMPLANT
SUT MNCRL AB 4-0 PS2 18 (SUTURE) ×7 IMPLANT
SUT SILK 0 SH 30 (SUTURE) IMPLANT
SUT VICRYL 0 UR6 27IN ABS (SUTURE) ×3 IMPLANT
SYR 20CC LL (SYRINGE) ×3 IMPLANT
SYR 50ML LL SCALE MARK (SYRINGE) ×3 IMPLANT
SYSTEM CARTER THOMASON II (TROCAR) ×5 IMPLANT
TOWEL OR 17X26 10 PK STRL BLUE (TOWEL DISPOSABLE) ×3 IMPLANT
TOWEL OR NON WOVEN STRL DISP B (DISPOSABLE) ×3 IMPLANT
TROCAR BLADELESS 15MM (ENDOMECHANICALS) ×3 IMPLANT
TROCAR BLADELESS OPT 5 100 (ENDOMECHANICALS) ×3 IMPLANT
TUBING CONNECTING 10 (TUBING) ×2 IMPLANT
TUBING CONNECTING 10' (TUBING) ×1
TUBING ENDO SMARTCAP (MISCELLANEOUS) ×3 IMPLANT
TUBING FILTER THERMOFLATOR (ELECTROSURGICAL) ×3 IMPLANT

## 2015-05-31 NOTE — Anesthesia Postprocedure Evaluation (Signed)
Anesthesia Post Note  Patient: Laurie Hodge  Procedure(s) Performed: Procedure(s) (LRB): LAPAROSCOPIC GASTRIC SLEEVE RESECTION (N/A)  Patient location during evaluation: PACU Anesthesia Type: General Level of consciousness: awake and alert Pain management: pain level controlled Vital Signs Assessment: post-procedure vital signs reviewed and stable Respiratory status: spontaneous breathing, nonlabored ventilation, respiratory function stable and patient connected to nasal cannula oxygen Cardiovascular status: blood pressure returned to baseline and stable Postop Assessment: no signs of nausea or vomiting Anesthetic complications: no    Last Vitals:  Filed Vitals:   05/31/15 1245 05/31/15 1300  BP: 156/83 150/72  Pulse: 67   Temp:    Resp: 22 20    Last Pain:  Filed Vitals:   05/31/15 1300  PainSc: 6                  [name] L

## 2015-05-31 NOTE — H&P (Signed)
History of Present Illness   Patient words: pre-op.  The patient is a 54 year old female who presents for a bariatric surgery evaluation. 54 year old female presents for preoperative visit for laparoscopic sleeve gastrectomy. She has undergone psychological evaluation as well as dietary evaluation. She understands the changes expected with the sleeve gastrectomy and is prepared for them. She has lost 7 pounds since initial evaluation. She had questions as to whether she should take Humira tomorrow which she takes every 2 weeks. I advised her to go ahead and take it tomorrow but it would not take in 2 weeks. She notes that while being on the clear liquids she has been a little more constipated and is taking Colace over-the-counter.   Allergies Adhesive Tape 1"x5yd *MEDICAL DEVICES AND SUPPLIES* Rash.  Medication History [name], CMA; 05/20/2015 4:30 PM) Lisinopril (20MG  Tablet, Oral) Active. Atorvastatin Calcium (20MG  Tablet, Oral) Active. Humira Pen (40MG /0.8ML Pen-inj Kit, Subcutaneous) Active. Hydrochlorothiazide (12.5MG  Capsule, Oral) Active. Meloxicam (7.5MG  Tablet, Oral) Active. Omeprazole (40MG  Capsule DR, Oral) Active. Pantoprazole Sodium (40MG  Tablet DR, Oral) Active. Krill Oil (450MG  Capsule, Oral) Active. Vitamin D3 (2000UNIT Tablet, Oral) Active. ZyrTEC Allergy (10MG  Capsule, Oral) Active. Ferrex 150 (150MG  Capsule, Oral two times daily) Active. Medications Reconciled    Review of Systems General Not Present- Anorexia, Appetite Loss and Chills. Skin Not Present- Bruising, Change in Wart/Mole and Coarse Hair. HEENT Not Present- Blurred Vision, Color Blindness and Headache. Neck Not Present- Neck Pain and Neck Stiffness. Respiratory Not Present- Cough, Difficulty Breathing and Difficulty Breathing on Exertion. Cardiovascular Not Present- Chest Pain and Claudications. Gastrointestinal Present- Constipation. Not Present- Abdominal Pain, Diarrhea,  Nausea and Vomiting. Musculoskeletal Present- Joint Pain. Not Present- Back Pain, Joint Redness and Joint Stiffness. Neurological Not Present- Difficulty Speaking, Dizziness and Fainting. Psychiatric Not Present- Anxiety and Depression. Endocrine Not Present- Cold Intolerance and Decreased Sweating. Hematology Not Present- Anemia, Blood Clots, Easy Bleeding and Easy Bruising.  BP 146/80 mmHg  Pulse 65  Temp(Src) 97.8 F (36.6 C) (Oral)  Resp 18  Ht 5\' 6"  (1.676 m)  Wt 118.559 kg (261 lb 6 oz)  BMI 42.21 kg/m2  SpO2 100%  LMP 04/12/2015  Physical Exam General Mental Status-Alert. General Appearance-Cooperative. Orientation-Oriented X4. Build & Nutrition-Obese. Posture-Normal posture.  Integumentary Global Assessment Upon inspection and palpation of skin surfaces of the - Head/Face: no rashes, ulcers, lesions or evidence of photo damage. No palpable nodules or masses and Neck: no visible lesions or palpable masses.  Head and Neck Head-normocephalic, atraumatic with no lesions or palpable masses. Face Global Assessment - atraumatic. Thyroid Gland Characteristics - normal size and consistency.  Eye Eyeball - Bilateral-Extraocular movements intact. Sclera/Conjunctiva - Bilateral-No scleral icterus, No Discharge.  ENMT Nose and Sinuses External Inspection of the Nose - no deformities observed, no swelling present.  Chest and Lung Exam Palpation Palpation of the chest reveals - Non-tender. Auscultation Breath sounds - Normal.  Cardiovascular Auscultation Rhythm - Regular. Heart Sounds - S1 WNL and S2 WNL. Carotid arteries - No Carotid bruit.  Abdomen Inspection Inspection of the abdomen reveals - No Visible peristalsis, No Abnormal pulsations and No Paradoxical movements. Note: small periumbilical scar. Palpation/Percussion Palpation and Percussion of the abdomen reveal - Soft, Non Tender, No Rebound tenderness, No Rigidity (guarding), No  hepatosplenomegaly and No Palpable abdominal masses.  Peripheral Vascular Upper Extremity Palpation - Pulses bilaterally normal. Lower Extremity Palpation - Edema - Bilateral - No edema.  Neurologic Neurologic evaluation reveals -normal sensation and normal coordination.  Neuropsychiatric Mental status  exam performed with findings of-able to articulate well with normal speech/language, rate, volume and coherence and thought content normal with ability to perform basic computations and apply abstract reasoning.  Musculoskeletal Normal Exam - Bilateral-Upper Extremity Strength Normal and Lower Extremity Strength Normal.    Assessment & Plan MORBID OBESITY, UNSPECIFIED OBESITY TYPE (E66.01) Impression: 54 year old female who presents for preoperative evaluation before sleeve gastrectomy. We reviewed the surgery in detail including but not limited to general anesthesia, laparoscopic procedure with possibility of open procedure, risk of blood clot, leak, incisional hernia, reflux, dysphagia, stenosis, and respiratory or cardiac event. She showed understanding and is excited to proceed. -lap sleeve Current Plans Started OxyCODONE HCl $RemoveBefor'5MG'RUetJSSphbpS$ /5ML, 5-10 Milliliter every four hours, as needed, 200 Milliliter, 05/20/2015, No Refill. Started Ondansetron HCl $RemoveBeforeD'4MG'imdbjTrTuNEgMB$ , 1 (one) Tablet every six hours, as needed for nausea, #15, 05/20/2015, No Refill. 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 [facility] Pg: [phone]

## 2015-05-31 NOTE — Op Note (Signed)
Preop Diagnosis: Obesity Class III  Postop Diagnosis: same  Procedure performed: laparoscopic sleeve gastrectomy  Assitant: Adonis Housekeeper  Indications:  The patient is a 54 y.o. year-old morbidly obese female who has been followed in the [facility] Clinic as an outpatient. This patient was diagnosed with morbid obesity with a BMI of Body mass index is 42.21 kg/(m^2). and significant co-morbidities including hypertension.  The patient was counseled extensively in the Bariatric Outpatient Clinic and after a thorough explanation of the risks and benefits of surgery (including death from complications, bowel leak, infection such as peritonitis and/or sepsis, internal hernia, bleeding, need for blood transfusion, bowel obstruction, organ failure, pulmonary embolus, deep venous thrombosis, wound infection, incisional hernia, skin breakdown, and others entailed on the consent form) and after a compliant diet and exercise program, the patient was scheduled for an elective laparoscopic sleeve gastrectomy.  Description of Operation:  Following informed consent, the patient was taken to the operating room and placed on the operating table in the supine position.  She had previously received prophylactic antibiotics and subcutaneous heparin for DVT prophylaxis in the pre-op holding area.  After induction of general endotracheal anesthesia by the anesthesiologist, the patient underwent placement of sequential compression devices, Foley catheter and an oro-gastric tube.  A timeout was confirmed by the surgery and anesthesia teams.  The patient was adequately padded at all pressure points and placed on a footboard to prevent slippage from the OR table during extremes of position during surgery.  She underwent a routine sterile prep and drape of her entire abdomen.    Next, A transverse incision was made under the left subcostal area and a 56mm optical viewing trocar was introduced into the peritoneal cavity.  Pneumoperitoneum was applied with a high flow and low pressure. A laparoscope was inserted to confirm placement. A extraperitoneal block was then placed at the lateral abdominal wall using 0.25% marcaine with epinephrine . 5 additional trocars were placed: 1 47mm trocar to the left of the midline. 1 additional 68mm trocar in the left lateral area, 1 50mm trocar in the right mid abdomen, and 1 24mm trocar in the right subcostal area. Finally a Nathanson retractor was placed in the subcostal space and used to retract the liver.  After taking down the adhesions using the Harmonic scalpel, the stomach was identified and decompressed by the oro-gastric tube.  The patient was then placed into steep reverse Trendelenburg position and the anesthesiologist was asked to empty the stomach and then remove ALL tubes (including the oro-gastric tube and the esophageal temperature probe) from the patient's GI tract.  After identification of the greater curvature and the pylorus, the lesser sac was entered along the greater curvature of the stomach.  Using the Harmonic scalpel, the gastroepiploic vessels and the short gastric vessels were cauterized close to the stomach wall and the process was continued proximally along the greater curvature up to the angle of His. The left crus was identified and all posterior adhesions to the fundus were severed. The greater curvature anterior wall and posterior wall was fully separated from the short gastric vessels.  At this point, a 27 [place] ViSiGi  was passed orally by the anesthesiologist and advanced down to the stomach.  The dilator was pushed against the lesser curvature and put on suction. Then using a laparoscopic Endo GIA 82mm extra-thick reinforced stapler was used to start the tubularization of the stomach. Following that, several 54mm thick reinforced loads of the stapler were used to create a thin  sleeve gastrectomy, carefully sizing the pouch to a total approximate volume of 100  cc, and removing nearly 80% of the stomach along the greater curvature.   At the end of the creation of sleeve gastrectomy, the staple line was closely examined and minor bleeding from the staple line was controlled. The ViSiGi was used for leak test, there was no evidence of any bubbles denoting absence of a leak. Next the assistant performed an upper endoscopy noting no abnormalities.  After adequate hemostasis, the resected stomach was placed into specimen bags and later removed through the 15 mm right upper quadrant incision after minimal dilatation.  This trocar site was then closed using a suture passer and a 0 Vicryl suture.  All trocars were removed under direct visualization after achieving hemostasis.  The skin was approximated using 4-0 Monocryl subcuticular sutures followed by application of liquiband. Drapes were drawn and the patient was then transferred to the recovery room in an intubated but stable condition.  She was later extubated without difficulty.  Specimens:  Gastric sleeve resection.  Post-Op Plan:       Pain Management: PO, prn      Antibiotics: Prophylactic      Anticoagulation: Prophylactic, Starting now      Post Op Studies/Consults: Not applicable      Intended Discharge: within 48h      Intended Outpatient Follow-Up: Two Week      Intended Outpatient Studies: Not Applicable      Other: Not Applicable   Arta Bruce Draylen Lobue

## 2015-05-31 NOTE — Anesthesia Preprocedure Evaluation (Signed)
Anesthesia Evaluation  Patient identified by MRN, date of birth, ID band Patient awake    Reviewed: Allergy & Precautions, H&P , NPO status , Patient's Chart, lab work & pertinent test results  Airway Mallampati: II  TM Distance: >3 FB Neck ROM: full    Dental no notable dental hx. (+) Dental Advisory Given, Teeth Intact   Pulmonary sleep apnea and Continuous Positive Airway Pressure Ventilation ,    Pulmonary exam normal breath sounds clear to auscultation       Cardiovascular hypertension, Normal cardiovascular exam Rhythm:regular Rate:Normal     Neuro/Psych negative neurological ROS  negative psych ROS   GI/Hepatic negative GI ROS, Neg liver ROS, GERD  Medicated and Controlled,  Endo/Other  Morbid obesity  Renal/GU negative Renal ROS  negative genitourinary   Musculoskeletal  (+) Arthritis , Rheumatoid disorders,    Abdominal   Peds  Hematology negative hematology ROS (+)   Anesthesia Other Findings   Reproductive/Obstetrics negative OB ROS                             Anesthesia Physical Anesthesia Plan  ASA: III  Anesthesia Plan: General   Post-op Pain Management:    Induction: Intravenous  Airway Management Planned: Oral ETT  Additional Equipment:   Intra-op Plan:   Post-operative Plan: Extubation in OR  Informed Consent: I have reviewed the patients History and Physical, chart, labs and discussed the procedure including the risks, benefits and alternatives for the proposed anesthesia with the patient or authorized representative who has indicated his/her understanding and acceptance.   Dental Advisory Given  Plan Discussed with: CRNA and Surgeon  Anesthesia Plan Comments:         Anesthesia Quick Evaluation

## 2015-05-31 NOTE — Progress Notes (Signed)
Give phenergan per anesthesia protocol  - Dr Landry Dyke

## 2015-05-31 NOTE — Transfer of Care (Signed)
Immediate Anesthesia Transfer of Care Note  Patient: Laurie Hodge  Procedure(s) Performed: Procedure(s): LAPAROSCOPIC GASTRIC SLEEVE RESECTION (N/A)  Patient Location: PACU  Anesthesia Type:General  Level of Consciousness: Patient easily awoken, sedated, comfortable, cooperative, following commands, responds to stimulation.   Airway & Oxygen Therapy: Patient spontaneously breathing, ventilating well, oxygen via simple oxygen mask.  Post-op Assessment: Report given to PACU RN, vital signs reviewed and stable, moving all extremities.   Post vital signs: Reviewed and stable.  Complications: No apparent anesthesia complications

## 2015-05-31 NOTE — Anesthesia Procedure Notes (Addendum)
Procedure Name: Intubation Date/Time: 05/31/2015 10:23 AM Performed by: Deliah Boston Pre-anesthesia Checklist: Patient identified, Emergency Drugs available, Suction available and Patient being monitored Patient Re-evaluated:Patient Re-evaluated prior to inductionOxygen Delivery Method: Circle System Utilized Preoxygenation: Pre-oxygenation with 100% oxygen Intubation Type: IV induction, Rapid sequence and Cricoid Pressure applied Laryngoscope Size: Mac and 4 Grade View: Grade II Tube type: Oral Tube size: 7.5 mm Number of attempts: 1 Placement Confirmation: ETT inserted through vocal cords under direct vision,  positive ETCO2 and breath sounds checked- equal and bilateral Secured at: 21 cm Tube secured with: Tape Dental Injury: Teeth and Oropharynx as per pre-operative assessment

## 2015-06-01 LAB — CBC WITH DIFFERENTIAL/PLATELET
Basophils Absolute: 0 10*3/uL (ref 0.0–0.1)
Basophils Relative: 0 %
Eosinophils Absolute: 0 10*3/uL (ref 0.0–0.7)
Eosinophils Relative: 0 %
HEMATOCRIT: 35.1 % — AB (ref 36.0–46.0)
Hemoglobin: 10.9 g/dL — ABNORMAL LOW (ref 12.0–15.0)
LYMPHS ABS: 1.3 10*3/uL (ref 0.7–4.0)
LYMPHS PCT: 13 %
MCH: 25.2 pg — ABNORMAL LOW (ref 26.0–34.0)
MCHC: 31.1 g/dL (ref 30.0–36.0)
MCV: 81.1 fL (ref 78.0–100.0)
MONO ABS: 1 10*3/uL (ref 0.1–1.0)
MONOS PCT: 10 %
NEUTROS ABS: 7.5 10*3/uL (ref 1.7–7.7)
Neutrophils Relative %: 77 %
Platelets: 382 10*3/uL (ref 150–400)
RBC: 4.33 MIL/uL (ref 3.87–5.11)
RDW: 15 % (ref 11.5–15.5)
WBC: 9.8 10*3/uL (ref 4.0–10.5)

## 2015-06-01 LAB — HEMOGLOBIN AND HEMATOCRIT, BLOOD
HCT: 37.1 % (ref 36.0–46.0)
Hemoglobin: 11.5 g/dL — ABNORMAL LOW (ref 12.0–15.0)

## 2015-06-01 MED ORDER — PROMETHAZINE HCL 25 MG/ML IJ SOLN
12.5000 mg | Freq: Four times a day (QID) | INTRAMUSCULAR | Status: DC | PRN
  Filled 2015-06-01: qty 1

## 2015-06-01 NOTE — Progress Notes (Signed)
Patient alert and oriented, Post op day 1.  Provided support and encouragement.  Encouraged pulmonary toilet, ambulation and small sips of liquids.  All questions answered.  Will continue to monitor. 

## 2015-06-01 NOTE — Care Management Note (Signed)
Case Management Note  Patient Details  Name: Laurie Hodge MRN: [id number] Date of Birth: 1960/08/26  Subjective/Objective:    S/p Laparoscopic Gastric Sleeve Resection and an EGD                 Action/Plan: Discharge planning, no HH needs identified  Expected Discharge Date:  06/02/15               Expected Discharge Plan:  Home/Self Care  In-House Referral:  NA  Discharge planning Services  CM Consult  Post Acute Care Choice:  NA Choice offered to:  NA  DME Arranged:  N/A DME Agency:  NA  HH Arranged:  NA HH Agency:  NA  Status of Service:  Completed, signed off  Medicare Important Message Given:    Date Medicare IM Given:    Medicare IM give by:    Date Additional Medicare IM Given:    Additional Medicare Important Message give by:     If discussed at [place] of Stay Meetings, dates discussed:    Additional Comments:  Guadalupe Maple, RN 06/01/2015, 12:53 PM

## 2015-06-01 NOTE — Plan of Care (Signed)
Problem: Food- and Nutrition-Related Knowledge Deficit (NB-1.1) Goal: Nutrition education Formal process to instruct or train a patient/client in a skill or to impart knowledge to help patients/clients voluntarily manage or modify food choices and eating behavior to maintain or improve health. Outcome: Completed/Met Date Met:  06/01/15 Nutrition Education Note  Received consult for diet education per DROP protocol.   Discussed 2 week post op diet with pt. Emphasized that liquids must be non carbonated, non caffeinated, and sugar free. Fluid goals discussed. Pt to follow up with outpatient bariatric RD for further diet progression after 2 weeks. Multivitamins and minerals also reviewed. Teach back method used, pt expressed understanding, expect good compliance.   Diet: First 2 Weeks  You will see the nutritionist about two (2) weeks after your surgery. The nutritionist will increase the types of foods you can eat if you are handling liquids well:  If you have severe vomiting or nausea and cannot handle clear liquids lasting longer than 1 day, call your surgeon  Protein Shake  Drink at least 2 ounces of shake 5-6 times per day  Each serving of protein shakes (usually 8 - 12 ounces) should have a minimum of:  15 grams of protein  And no more than 5 grams of carbohydrate  Goal for protein each day:  Men = 80 grams per day  Women = 60 grams per day  Protein powder may be added to fluids such as non-fat milk or Lactaid milk or Soy milk (limit to 35 grams added protein powder per serving)   Hydration  Slowly increase the amount of water and other clear liquids as tolerated (See Acceptable Fluids)  Slowly increase the amount of protein shake as tolerated  Sip fluids slowly and throughout the day  May use sugar substitutes in small amounts (no more than 6 - 8 packets per day; i.e. Splenda)   Fluid Goal  The first goal is to drink at least 8 ounces of protein shake/drink per day (or as directed  by the nutritionist); some examples of protein shakes are Syntrax Nectar, Adkins Advantage, EAS Edge HP, and Unjury. See handout from pre-op Bariatric Education Class:  Slowly increase the amount of protein shake you drink as tolerated  You may find it easier to slowly sip shakes throughout the day  It is important to get your proteins in first  Your fluid goal is to drink 64 - 100 ounces of fluid daily  It may take a few weeks to build up to this  32 oz (or more) should be clear liquids  And  32 oz (or more) should be full liquids (see below for examples)  Liquids should not contain sugar, caffeine, or carbonation   Clear Liquids:  Water or Sugar-free flavored water (i.e. Fruit H2O, Propel)  Decaffeinated coffee or tea (sugar-free)  Crystal Lite, Wyler's Lite, Minute Maid Lite  Sugar-free Jell-O  Bouillon or broth  Sugar-free Popsicle: *Less than 20 calories each; Limit 1 per day   Full Liquids:  Protein Shakes/Drinks + 2 choices per day of other full liquids  Full liquids must be:  No More Than 12 grams of Carbs per serving  No More Than 3 grams of Fat per serving  Strained low-fat cream soup  Non-Fat milk  Fat-free Lactaid Milk  Sugar-free yogurt (Dannon Lite & Fit, Greek yogurt)     Joon Pohle, MS, RD, LDN Pager: [phone] After Hours Pager: [phone]        

## 2015-06-01 NOTE — Progress Notes (Signed)
Patient continues to have nausea. Current medication not effective. MD Notified. Received telephone order for phenergan 12.5mg  IV PRN Q6h for nausea. Order placed. Will continue to monitor patient.

## 2015-06-01 NOTE — Progress Notes (Signed)
  Progress Note: General Surgery Service   Subjective: Nausea issues overnight. Improved today. Ambulating well, pain moderate  Objective: Vital signs in last 24 hours: Temp:  [97.6 F (36.4 C)-98.6 F (37 C)] 98.5 F (36.9 C) (12/13 0558) Pulse Rate:  [64-74] 66 (12/13 0558) Resp:  [16-23] 16 (12/13 0558) BP: (130-163)/(44-83) 144/56 mmHg (12/13 0558) SpO2:  [99 %-100 %] 100 % (12/13 0558)    Intake/Output from previous day: 12/12 0701 - 12/13 0700 In: 1408.3 [I.V.:1408.3] Out: 2350 [Urine:2350] Intake/Output this shift:    Lungs: CTAB  Cardiovascular: RRR  Abd: soft, NT, ND, wounds c/d/i  Extremities: no edema  Neuro: AOx4  Lab Results: CBC   Recent Labs  05/31/15 1230 06/01/15 0602  WBC 15.9* 9.8  HGB 10.8*  10.9* 10.9*  HCT 34.9*  35.5* 35.1*  PLT 348 382   BMET  Recent Labs  05/31/15 1230  CREATININE 0.82   PT/INR No results for input(s): LABPROT, INR in the last 72 hours. ABG No results for input(s): PHART, HCO3 in the last 72 hours.  Invalid input(s): PCO2, PO2  Studies/Results:  Anti-infectives: Anti-infectives    Start     Dose/Rate Route Frequency Ordered Stop   05/31/15 0806  cefOXitin (MEFOXIN) 2 g in dextrose 5 % 50 mL IVPB     2 g 100 mL/hr over 30 Minutes Intravenous On call to O.R. 05/31/15 0806 05/31/15 1029      Medications: Scheduled Meds: . heparin subcutaneous  5,000 Units Subcutaneous 3 times per day  . pantoprazole (PROTONIX) IV  40 mg Intravenous QHS  . [START ON 06/02/2015] protein supplement shake  2 oz Oral QID   Continuous Infusions: . dextrose 5 % and 0.45% NaCl 125 mL/hr at 06/01/15 0647   PRN Meds:.oxyCODONE **AND** acetaminophen, acetaminophen (TYLENOL) oral liquid 160 mg/5 mL, morphine injection, ondansetron (ZOFRAN) IV  Assessment/Plan: Patient Active Problem List   Diagnosis Date Noted  . Severe obesity ([place]) 05/31/2015  . Osteoarthritis 02/05/2015  . Hyperlipidemia 11/06/2013  . Rheumatoid  arthritis ([place]) 06/10/2013  . Obesity, Class III, BMI 40-49.9 (morbid obesity) ([place])   . GERD (gastroesophageal reflux disease)   . OSA on CPAP   . HTN (hypertension)   . HSV-2 infection   . Vitamin D insufficiency   . Iron (Fe) deficiency anemia    s/p Procedure(s): LAPAROSCOPIC GASTRIC SLEEVE RESECTION 05/31/2015 Some post op nausea, improving -ok for water -continue to ambulate -pain control   LOS: 1 day   Mickeal Skinner, MD Pg# [phone] [facility]

## 2015-06-02 LAB — CBC WITH DIFFERENTIAL/PLATELET
Basophils Absolute: 0 10*3/uL (ref 0.0–0.1)
Basophils Relative: 0 %
EOS ABS: 0 10*3/uL (ref 0.0–0.7)
EOS PCT: 0 %
HCT: 37.6 % (ref 36.0–46.0)
Hemoglobin: 11.4 g/dL — ABNORMAL LOW (ref 12.0–15.0)
LYMPHS ABS: 1.6 10*3/uL (ref 0.7–4.0)
LYMPHS PCT: 22 %
MCH: 25 pg — AB (ref 26.0–34.0)
MCHC: 30.3 g/dL (ref 30.0–36.0)
MCV: 82.5 fL (ref 78.0–100.0)
MONO ABS: 0.7 10*3/uL (ref 0.1–1.0)
MONOS PCT: 9 %
Neutro Abs: 4.9 10*3/uL (ref 1.7–7.7)
Neutrophils Relative %: 69 %
PLATELETS: 377 10*3/uL (ref 150–400)
RBC: 4.56 MIL/uL (ref 3.87–5.11)
RDW: 15 % (ref 11.5–15.5)
WBC: 7.2 10*3/uL (ref 4.0–10.5)

## 2015-06-02 NOTE — Discharge Instructions (Signed)

## 2015-06-02 NOTE — Progress Notes (Signed)
Patient alert and oriented, pain controlled. Patient given discharge instructions and all questions answered. Patient verbalized understanding of instructions and home care.

## 2015-06-02 NOTE — Progress Notes (Signed)
Patient alert and oriented, pain is controlled. Patient is tolerating fluids, advanced to protein shake today, patient tolerated well. Reviewed Gastric Sleeve discharge instructions with patient and patient is able to articulate understanding. Provided information on BELT program, Support Group and [facility] outpatient pharmacy. All questions answered, will continue to monitor.

## 2015-06-04 NOTE — Discharge Summary (Signed)
Physician Discharge Summary  Patient ID: Laurie Hodge MRN: [id number] DOB/AGE: 1960/09/15 54 y.o.  Admit date: 05/31/2015 Discharge date: 06/04/2015  Admission Diagnoses:  Discharge Diagnoses:  Active Problems:   Severe obesity ([place])   Discharged Condition: good  Hospital Course: 54 yo female admitted after sleeve gastrectomy. POD 1 she had moderate nausea issues, this improved and POD 2 she was able to meet goals of intake and tolerate her protein supplement and was subsequently discharged. Her pain was well controlled and she ambulated throughout the hospital stay.  Consults: none  Significant Diagnostic Studies: labs: WBC 7.2, HGB 11.4  Treatments: laparoscopic sleeve gastrectomy  Discharge Exam: Blood pressure 148/43, pulse 61, temperature 99.3 F (37.4 C), temperature source Oral, resp. rate 19, height 5\' 6"  (1.676 m), weight 118.559 kg (261 lb 6 oz), last menstrual period 04/12/2015, SpO2 100 %. General appearance: alert and cooperative Head: Normocephalic, without obvious abnormality, atraumatic Resp: clear to auscultation bilaterally Cardio: regular rate and rhythm, S1, S2 normal, no murmur, click, rub or gallop GI: soft, non-tender; bowel sounds normal; no masses,  no organomegaly and wounds c/d/i  Disposition: 01-Home or Self Care  Discharge Instructions    Call MD for:  difficulty breathing, headache or visual disturbances    Complete by:  As directed      Call MD for:  persistant nausea and vomiting    Complete by:  As directed      Call MD for:  redness, tenderness, or signs of infection (pain, swelling, redness, odor or green/yellow discharge around incision site)    Complete by:  As directed      Call MD for:  severe uncontrolled pain    Complete by:  As directed      Call MD for:  temperature >100.4    Complete by:  As directed      Discharge instructions    Complete by:  As directed   Continue post bariatric diet     Increase activity slowly     Complete by:  As directed             Medication List    STOP taking these medications        atorvastatin 20 MG tablet  Commonly known as:  LIPITOR     meloxicam 7.5 MG tablet  Commonly known as:  MOBIC      TAKE these medications        acetaminophen 500 MG tablet  Commonly known as:  TYLENOL  Take 1,000 mg by mouth every 6 (six) hours as needed (pain).     calcium-vitamin D 500-200 MG-UNIT tablet  Commonly known as:  OSCAL WITH D  Take 1 tablet by mouth 3 (three) times daily.     cetirizine 10 MG tablet  Commonly known as:  ZYRTEC  Take 10 mg by mouth daily as needed for allergies.     cholecalciferol 1000 UNITS tablet  Commonly known as:  VITAMIN D  Take 2,000 Units by mouth daily.     docusate sodium 100 MG capsule  Commonly known as:  COLACE  Take 100 mg by mouth 2 (two) times daily.     HUMIRA PEN 40 MG/0.8ML Pnkt  Generic drug:  Adalimumab  Inject 40 mg into the skin every 14 (fourteen) days.     hydrochlorothiazide 12.5 MG capsule  Commonly known as:  MICROZIDE  Take 1 capsule (12.5 mg total) by mouth daily.  Notes to Patient:  Monitor Blood Pressure Daily and keep  a log for primary care physician.  Monitor for symptoms of dehydration.  You may need to make changes to your medications with rapid weight loss.        iron polysaccharides 150 MG capsule  Commonly known as:  FERREX 150  TAKE ONE CAPSULE BY MOUTH TWICE DAILY     KRILL OIL PLUS Caps  Take 750 mg by mouth daily.     lisinopril 20 MG tablet  Commonly known as:  PRINIVIL,ZESTRIL  Take 1 tablet (20 mg total) by mouth 2 (two) times daily.  Notes to Patient:  Monitor Blood Pressure Daily and keep a log for primary care physician.  You may need to make changes to your medications with rapid weight loss.        multivitamin with minerals Tabs tablet  Take 1 tablet by mouth 2 (two) times daily.     omeprazole 40 MG capsule  Commonly known as:  PRILOSEC  TAKE ONE CAPSULE BY MOUTH ONCE  DAILY.     senna 8.6 MG Tabs tablet  Commonly known as:  SENOKOT  Take 2 tablets by mouth as needed for mild constipation.     Vitamin B-12 2500 MCG Subl  Place 2,500 mcg under the tongue daily.           Follow-up Information    Follow up with Mickeal Skinner, MD. Go on 06/11/2015.   Specialty:  General Surgery   Why:  For Post-Op Check at 11:45   Contact information:   [address] [place] [address] [phone]       Signed: Arta Bruce Kinsinger 06/04/2015, 11:12 AM

## 2015-06-07 ENCOUNTER — Telehealth (HOSPITAL_COMMUNITY): Payer: Self-pay

## 2015-06-07 NOTE — Telephone Encounter (Signed)
Made discharge phone call to patient per DROP protocol. Asking the following questions.    1. Do you have someone to care for you now that you are home?  yes 2. Are you having pain now that is not relieved by your pain medication?  no 3. Are you able to drink the recommended daily amount of fluids (48 ounces minimum/day) and protein (60-80 grams/day) as prescribed by the dietitian or nutritional counselor?  No quite getting 64 oz of fluid but I am getting protein in 4. Are you taking the vitamins and minerals as prescribed?  yes 5. Do you have the "on call" number to contact your surgeon if you have a problem or question?  yes 6. Are your incisions free of redness, swelling or drainage? (If steri strips, address that these can fall off, shower as tolerated) no 7. Have your bowels moved since your surgery?  If not, are you passing gas?  yes 8. Are you up and walking 3-4 times per day?  Yes  Patient complains of low grade temp, 99.5-100.8, discussed continued incentive spirometry use, and speaking with surgeon if not resolved.

## 2015-06-15 ENCOUNTER — Encounter: Payer: BC Managed Care – PPO | Attending: General Surgery

## 2015-06-15 VITALS — Ht 66.0 in | Wt 251.0 lb

## 2015-06-15 DIAGNOSIS — Z713 Dietary counseling and surveillance: Secondary | ICD-10-CM | POA: Diagnosis not present

## 2015-06-15 DIAGNOSIS — Z6841 Body Mass Index (BMI) 40.0 and over, adult: Secondary | ICD-10-CM | POA: Insufficient documentation

## 2015-06-16 NOTE — Progress Notes (Signed)
Bariatric Class:  Appt start time: 1530 end time:  1630.  2 Week Post-Operative Nutrition Class  Patient was seen on 06/15/2015 for Post-Operative Nutrition education [facility].   Surgery date: 05/31/15 Surgery type: Sleeve gastrectomy Start weight at [facility]: 277.5 lbs on 04/08/2015 Weight today: 251 lbs Weight change: 26.5 lbs  TANITA  BODY COMP RESULTS  05/18/15 06/15/15   BMI (kg/m^2) 44.8 40.5   Fat Mass (lbs) 143.5 133.5   Fat Free Mass (lbs) 134 117.5   Total Body Water (lbs) 98 86    The following the learning objectives were met by the patient during this course:  Identifies Phase 3A (Soft, High Proteins) Dietary Goals and will begin from 2 weeks post-operatively to 2 months post-operatively  Identifies appropriate sources of fluids and proteins   States protein recommendations and appropriate sources post-operatively  Identifies the need for appropriate texture modifications, mastication, and bite sizes when consuming solids  Identifies appropriate multivitamin and calcium sources post-operatively  Describes the need for physical activity post-operatively and will follow MD recommendations  States when to call healthcare provider regarding medication questions or post-operative complications  Handouts given during class include:  Phase 3A: Soft, High Protein Diet Handout  Band Fill Guidelines Handout  Follow-Up Plan: Patient will follow-up at [facility] in 4 weeks for 6 week post-op nutrition visit for diet advancement per MD.

## 2015-07-01 ENCOUNTER — Ambulatory Visit: Payer: Self-pay | Admitting: Physician Assistant

## 2015-07-06 ENCOUNTER — Ambulatory Visit (INDEPENDENT_AMBULATORY_CARE_PROVIDER_SITE_OTHER): Payer: BC Managed Care – PPO | Admitting: Physician Assistant

## 2015-07-06 ENCOUNTER — Encounter: Payer: Self-pay | Admitting: Physician Assistant

## 2015-07-06 VITALS — BP 120/80 | HR 79 | Temp 98.9°F | Resp 16 | Ht 66.0 in | Wt 236.0 lb

## 2015-07-06 DIAGNOSIS — I10 Essential (primary) hypertension: Secondary | ICD-10-CM | POA: Diagnosis not present

## 2015-07-06 DIAGNOSIS — Z6838 Body mass index (BMI) 38.0-38.9, adult: Secondary | ICD-10-CM

## 2015-07-06 DIAGNOSIS — E559 Vitamin D deficiency, unspecified: Secondary | ICD-10-CM | POA: Diagnosis not present

## 2015-07-06 DIAGNOSIS — D509 Iron deficiency anemia, unspecified: Secondary | ICD-10-CM

## 2015-07-06 DIAGNOSIS — Z1159 Encounter for screening for other viral diseases: Secondary | ICD-10-CM

## 2015-07-06 DIAGNOSIS — M059 Rheumatoid arthritis with rheumatoid factor, unspecified: Secondary | ICD-10-CM | POA: Diagnosis not present

## 2015-07-06 DIAGNOSIS — E785 Hyperlipidemia, unspecified: Secondary | ICD-10-CM | POA: Diagnosis not present

## 2015-07-06 LAB — CBC WITH DIFFERENTIAL/PLATELET
Basophils Absolute: 0 10*3/uL (ref 0.0–0.1)
Basophils Relative: 1 % (ref 0–1)
EOS ABS: 0.1 10*3/uL (ref 0.0–0.7)
Eosinophils Relative: 2 % (ref 0–5)
HEMATOCRIT: 39.6 % (ref 36.0–46.0)
Hemoglobin: 13 g/dL (ref 12.0–15.0)
LYMPHS ABS: 1.8 10*3/uL (ref 0.7–4.0)
Lymphocytes Relative: 44 % (ref 12–46)
MCH: 26.3 pg (ref 26.0–34.0)
MCHC: 32.8 g/dL (ref 30.0–36.0)
MCV: 80 fL (ref 78.0–100.0)
MONO ABS: 0.5 10*3/uL (ref 0.1–1.0)
MONOS PCT: 11 % (ref 3–12)
MPV: 8.9 fL (ref 8.6–12.4)
Neutro Abs: 1.8 10*3/uL (ref 1.7–7.7)
Neutrophils Relative %: 42 % — ABNORMAL LOW (ref 43–77)
PLATELETS: 299 10*3/uL (ref 150–400)
RBC: 4.95 MIL/uL (ref 3.87–5.11)
RDW: 15.7 % — AB (ref 11.5–15.5)
WBC: 4.2 10*3/uL (ref 4.0–10.5)

## 2015-07-06 LAB — COMPREHENSIVE METABOLIC PANEL
ALBUMIN: 4.2 g/dL (ref 3.6–5.1)
ALT: 22 U/L (ref 6–29)
AST: 23 U/L (ref 10–35)
Alkaline Phosphatase: 64 U/L (ref 33–130)
BUN: 13 mg/dL (ref 7–25)
CHLORIDE: 104 mmol/L (ref 98–110)
CO2: 25 mmol/L (ref 20–31)
CREATININE: 0.68 mg/dL (ref 0.50–1.05)
Calcium: 9.3 mg/dL (ref 8.6–10.4)
Glucose, Bld: 85 mg/dL (ref 65–99)
POTASSIUM: 3.9 mmol/L (ref 3.5–5.3)
SODIUM: 140 mmol/L (ref 135–146)
Total Bilirubin: 0.6 mg/dL (ref 0.2–1.2)
Total Protein: 7.7 g/dL (ref 6.1–8.1)

## 2015-07-06 LAB — VITAMIN D 25 HYDROXY (VIT D DEFICIENCY, FRACTURES): Vit D, 25-Hydroxy: 59 ng/mL (ref 30–100)

## 2015-07-06 LAB — LIPID PANEL
CHOL/HDL RATIO: 3.1 ratio (ref <=5.0)
Cholesterol: 158 mg/dL (ref 125–200)
HDL: 51 mg/dL (ref >=46)
LDL CALC: 93 mg/dL (ref <130)
TRIGLYCERIDES: 71 mg/dL (ref <150)
VLDL: 14 mg/dL (ref <30)

## 2015-07-06 LAB — HEPATITIS C ANTIBODY: HCV Ab: NEGATIVE

## 2015-07-06 MED ORDER — ADALIMUMAB 40 MG/0.8ML ~~LOC~~ AJKT: 40.0000 mg | AUTO-INJECTOR | SUBCUTANEOUS | Status: DC

## 2015-07-06 NOTE — Progress Notes (Signed)
Subjective:    Patient ID: Laurie Hodge, female    DOB: 01/30/1961, 55 y.o.   MRN: [id number]  Chief Complaint  Patient presents with  . Follow-up  . Hyperlipidemia  . weight loss  . Medication Refill    Lisinopril 20 mg bid  . breast pain    "has gone away"   HPI Patient presents today for a f/u visit on Hyperlipidemia, and wt loss. She reports she is taking all her medications as listed, with no complications or complaints. She needs refills on her Humara and Lisinopril. 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 She reports no complications and is doing well. She has lost 41.5 lbs since the surgery. She is seeing a nutritionalist, with her next appointment on 07/28/15. She reports since her surgery her diet consists of mainly shakes, as she is having difficulty, but improving on getting the recommended solid food and water intake. She was told by the surgeon and dietician to see her primary to get her med levels checked. As her weight comes down she was told she may not require as much of her medications   Her GERD has improved. Denies any ST, cough, or chest pain. Her CPAP broke right before the surgery so she has not been using. She has experienced only one bought of apnea on 06/05/15. Otherwise denies any new fatigue (imporving), or sleep disturbances.   Before her surgery she went off her RA medicine, and could tell the difference. Currently, the medication is working and she does not feel like her RA gets in the way of her daily activities.   The breast pain she reported on during her last appointment has resolved. The mammography that was taken on 01/18/15 was negative.  Review of Systems  Constitutional: Fatigue: improving.  HENT: Negative for sore throat.   Respiratory: Negative for cough, chest tightness and shortness of breath.   Cardiovascular: Negative for chest pain (GERD improving) and palpitations.  Gastrointestinal: Positive for nausea (improving). Negative for  diarrhea, constipation (once every 3 days, denies straining) and abdominal distention.  Musculoskeletal: Arthralgias: chronic.  Skin: Color change: dry.  Hematological: Does not bruise/bleed easily.   Allergies  Allergen Reactions  . Adhesive [Tape] Rash    Skin Peels   Prior to Admission medications   Medication Sig Start Date End Date Taking? Authorizing Provider  acetaminophen (TYLENOL) 500 MG tablet Take 1,000 mg by mouth every 6 (six) hours as needed (pain).   Yes Historical Provider, MD  Adalimumab (HUMIRA PEN) 40 MG/0.8ML PNKT Inject 40 mg into the skin every 14 (fourteen) days. 07/06/15  Yes Chelle Jeffery, PA-C  calcium-vitamin D (OSCAL WITH D) 500-200 MG-UNIT tablet Take 1 tablet by mouth 3 (three) times daily.   Yes Historical Provider, MD  cetirizine (ZYRTEC) 10 MG tablet Take 10 mg by mouth daily as needed for allergies.    Yes Historical Provider, MD  cholecalciferol (VITAMIN D) 1000 UNITS tablet Take 2,000 Units by mouth daily.    Yes Historical Provider, MD  Cyanocobalamin (VITAMIN B-12) 2500 MCG SUBL Place 2,500 mcg under the tongue daily.   Yes Historical Provider, MD  docusate sodium (COLACE) 100 MG capsule Take 100 mg by mouth 2 (two) times daily.   Yes Historical Provider, MD  Fish Oil-Krill Oil (KRILL OIL PLUS) CAPS Take 750 mg by mouth daily.   Yes Historical Provider, MD  hydrochlorothiazide (MICROZIDE) 12.5 MG capsule Take 1 capsule (12.5 mg total) by mouth daily. 12/29/14  Yes Harrison Mons,  PA-C  iron polysaccharides (FERREX 150) 150 MG capsule TAKE ONE CAPSULE BY MOUTH TWICE DAILY Patient taking differently: Take 150 mg by mouth 2 (two) times daily.  12/29/14  Yes Chelle Jeffery, PA-C  lisinopril (PRINIVIL,ZESTRIL) 20 MG tablet Take 1 tablet (20 mg total) by mouth 2 (two) times daily. 12/29/14  Yes Chelle Jeffery, PA-C  Multiple Vitamin (MULTIVITAMIN WITH MINERALS) TABS tablet Take 1 tablet by mouth 2 (two) times daily.   Yes Historical Provider, MD  omeprazole  (PRILOSEC) 40 MG capsule TAKE ONE CAPSULE BY MOUTH ONCE DAILY. Patient taking differently: Take 40 mg by mouth daily.  02/22/15  Yes Chelle Jeffery, PA-C  senna (SENOKOT) 8.6 MG TABS tablet Take 2 tablets by mouth as needed for mild constipation.   Yes Historical Provider, MD   Patient Active Problem List   Diagnosis Date Noted  . Osteoarthritis 02/05/2015  . Hyperlipidemia 11/06/2013  . Rheumatoid arthritis ([place]) 06/10/2013  . BMI 38.0-38.9,adult   . GERD (gastroesophageal reflux disease)   . OSA on CPAP   . HTN (hypertension)   . HSV-2 infection   . Vitamin D insufficiency   . Iron (Fe) deficiency anemia        Objective:   Physical Exam  Constitutional: Vital signs are normal. She appears well-developed and well-nourished. No distress.  BP 120/80 mmHg  Pulse 79  Temp(Src) 98.9 F (37.2 C) (Oral)  Resp 16  Ht 5\' 6"  (1.676 m)  Wt 236 lb (107.049 kg)  BMI 38.11 kg/m2  SpO2 97%  LMP 06/03/2015   HENT:  Head: Normocephalic and atraumatic.  Eyes: Conjunctivae and lids are normal. Pupils are equal, round, and reactive to light.  Neck: Trachea normal. Neck supple.  Cardiovascular: Normal rate, regular rhythm, S1 normal, S2 normal and normal heart sounds.  Exam reveals no gallop and no friction rub.   Abdominal: Soft. Normal appearance and bowel sounds are normal. She exhibits no distension and no mass. There is no hepatosplenomegaly. There is no tenderness. There is no guarding.  Well healing scars consistent with laparoscopic gastric sleeve.   Lymphadenopathy:       Head (right side): No submental, no submandibular, no tonsillar, no preauricular, no posterior auricular and no occipital adenopathy present.       Head (left side): No submental, no submandibular, no tonsillar, no preauricular, no posterior auricular and no occipital adenopathy present.    She has no cervical adenopathy.  Neurological: She is alert.  Skin: Skin is warm and dry. No erythema.  Psychiatric: She has a  normal mood and affect. Her behavior is normal.  Vitals reviewed.     Assessment & Plan:  1. Essential hypertension - Comprehensive metabolic panel  2. Hyperlipidemia - Lipid panel  3. BMI 38.0-38.9,adult -Patient seeing a dietician.   4. Vitamin D insufficiency - VITAMIN D 25 Hydroxy (Vit-D Deficiency, Fractures)  5. Rheumatoid arthritis with positive rheumatoid factor, involving unspecified site (HCC) - Adalimumab (HUMIRA PEN) 40 MG/0.8ML PNKT; Inject 40 mg into the skin every 14 (fourteen) days.  Dispense: 2 each; Refill: 3  6. Need for hepatitis C screening test - Hepatitis C antibody  7. Iron (Fe) deficiency anemia - CBC with Differential/Platelet  Patient told to monitor BP at home. If levels drop below 110/60 consistently she is to call the office. Our plan is to remove the HCTZ as her BP improves due to the weight loss. Patient has next appointment scheduled for next month in which we will recheck blood levels and talk  about starting to change medications as her symptoms/chronic illnesses improve.

## 2015-07-06 NOTE — Progress Notes (Signed)
Patient ID: Laurie Hodge, female    DOB: 30-Jun-1960, 55 y.o.   MRN: [id number]  PCP: Analyse Angst, PA-C  Subjective:   Chief Complaint  Patient presents with  . Follow-up  . Hyperlipidemia  . weight loss  . Medication Refill    Lisinopril 20 mg bid  . breast pain    "has gone away"    HPI Presents for evaluation of Hyperlipidemia, and wt loss. She reports she is taking all her medications as listed, with no complications or complaints. She needs refills on her Humara and Lisinopril. 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 She reports no complications and is doing well. She has lost 41.5 lbs since the surgery. She is seeing a nutritionalist, with her next appointment on 07/28/15. She reports since her surgery her diet consists of mainly shakes, as she is having difficulty, but improving on getting the recommended solid food and water intake. She was told by the surgeon and dietician to see her primary to get her med levels checked. As her weight comes down she was told she may not require as much of her medications   Her GERD has improved. Denies any ST, cough, or chest pain. Her CPAP broke right before the surgery so she has not been using. She has experienced only one bought of apnea on 06/05/15. Otherwise denies any new fatigue (imporving), or sleep disturbances.   Before her surgery she went off her RA medicine, and could tell the difference. Currently, the medication is working and she does not feel like her RA gets in the way of her daily activities.   The breast pain she reported on during her last appointment has resolved. The mammography that was taken on 01/18/15 was negative.    Review of Systems Constitutional: Fatigue: improving.  HENT: Negative for sore throat.  Respiratory: Negative for cough, chest tightness and shortness of breath.  Cardiovascular: Negative for chest pain (GERD improving) and palpitations.  Gastrointestinal: Positive for nausea (improving).  Negative for diarrhea, constipation (once every 3 days, denies straining) and abdominal distention.  Musculoskeletal: Arthralgias: chronic.  Skin: Color change: dry.  Hematological: Does not bruise/bleed easily.     Patient Active Problem List   Diagnosis Date Noted  . Severe obesity ([place]) 05/31/2015  . Osteoarthritis 02/05/2015  . Hyperlipidemia 11/06/2013  . Rheumatoid arthritis ([place]) 06/10/2013  . Obesity, Class III, BMI 40-49.9 (morbid obesity) ([place])   . GERD (gastroesophageal reflux disease)   . OSA on CPAP   . HTN (hypertension)   . HSV-2 infection   . Vitamin D insufficiency   . Iron (Fe) deficiency anemia      Prior to Admission medications   Medication Sig Start Date End Date Taking? Authorizing Provider  acetaminophen (TYLENOL) 500 MG tablet Take 1,000 mg by mouth every 6 (six) hours as needed (pain).   Yes Historical Provider, MD  calcium-vitamin D (OSCAL WITH D) 500-200 MG-UNIT tablet Take 1 tablet by mouth 3 (three) times daily.   Yes Historical Provider, MD  cetirizine (ZYRTEC) 10 MG tablet Take 10 mg by mouth daily as needed for allergies.    Yes Historical Provider, MD  cholecalciferol (VITAMIN D) 1000 UNITS tablet Take 2,000 Units by mouth daily.    Yes Historical Provider, MD  Cyanocobalamin (VITAMIN B-12) 2500 MCG SUBL Place 2,500 mcg under the tongue daily.   Yes Historical Provider, MD  docusate sodium (COLACE) 100 MG capsule Take 100 mg by mouth 2 (two) times daily.   Yes Historical  Provider, MD  Fish Oil-Krill Oil (KRILL OIL PLUS) CAPS Take 750 mg by mouth daily.   Yes Historical Provider, MD  HUMIRA PEN 40 MG/0.8ML PNKT Inject 40 mg into the skin every 14 (fourteen) days.  06/23/14  Yes Historical Provider, MD  hydrochlorothiazide (MICROZIDE) 12.5 MG capsule Take 1 capsule (12.5 mg total) by mouth daily. 12/29/14  Yes Coltin Casher, PA-C  iron polysaccharides (FERREX 150) 150 MG capsule TAKE ONE CAPSULE BY MOUTH TWICE DAILY Patient taking differently: Take 150  mg by mouth 2 (two) times daily.  12/29/14  Yes Tarina Volk, PA-C  lisinopril (PRINIVIL,ZESTRIL) 20 MG tablet Take 1 tablet (20 mg total) by mouth 2 (two) times daily. 12/29/14  Yes Bev Drennen, PA-C  Multiple Vitamin (MULTIVITAMIN WITH MINERALS) TABS tablet Take 1 tablet by mouth 2 (two) times daily.   Yes Historical Provider, MD  omeprazole (PRILOSEC) 40 MG capsule TAKE ONE CAPSULE BY MOUTH ONCE DAILY. Patient taking differently: Take 40 mg by mouth daily.  02/22/15  Yes Darril Patriarca, PA-C  senna (SENOKOT) 8.6 MG TABS tablet Take 2 tablets by mouth as needed for mild constipation.   Yes Historical Provider, MD     Allergies  Allergen Reactions  . Adhesive [Tape] Rash    Skin Peels       Objective:  Physical Exam  Constitutional: She is oriented to person, place, and time. Vital signs are normal. She appears well-developed and well-nourished. She is active and cooperative. No distress.  BP 120/80 mmHg  Pulse 79  Temp(Src) 98.9 F (37.2 C) (Oral)  Resp 16  Ht 5\' 6"  (1.676 m)  Wt 236 lb (107.049 kg)  BMI 38.11 kg/m2  SpO2 97%  LMP 06/03/2015  HENT:  Head: Normocephalic and atraumatic.  Right Ear: Hearing normal.  Left Ear: Hearing normal.  Eyes: Conjunctivae are normal. No scleral icterus.  Neck: Normal range of motion. Neck supple. No thyromegaly present.  Cardiovascular: Normal rate, regular rhythm and normal heart sounds.   Pulses:      Radial pulses are 2+ on the right side, and 2+ on the left side.  Pulmonary/Chest: Effort normal and breath sounds normal.  Lymphadenopathy:       Head (right side): No tonsillar, no preauricular, no posterior auricular and no occipital adenopathy present.       Head (left side): No tonsillar, no preauricular, no posterior auricular and no occipital adenopathy present.    She has no cervical adenopathy.       Right: No supraclavicular adenopathy present.       Left: No supraclavicular adenopathy present.  Neurological: She is alert  and oriented to person, place, and time. No sensory deficit.  Skin: Skin is warm, dry and intact. No rash noted. No cyanosis or erythema. Nails show no clubbing.  Abdominal surgical scars are well-healed.  Psychiatric: She has a normal mood and affect. Her speech is normal and behavior is normal.           Assessment & Plan:   1. Essential hypertension Controlled. Anticipate continued reduction as she loses weight. Monitor her BP weekly. When she is consistently <110/60, plan to stop the HCTZ. - Comprehensive metabolic panel  2. Hyperlipidemia Await results. Anticipate lipid reduction. - Lipid panel  3. BMI 38.0-38.9,adult Congratulated on her strong efforts and success in weight loss since gastric sleeve, and encouraged continued work.  4. Vitamin D insufficiency Await lab results. - VITAMIN D 25 Hydroxy (Vit-D Deficiency, Fractures)  5. Rheumatoid arthritis with positive rheumatoid  factor, involving unspecified site [facility]) Continue current treatment as recommended by rheumatology. I've refilled this the last couple of times, as a courtesy, but would prefer that the prescriptions come from her specialist. - Adalimumab (HUMIRA PEN) 40 MG/0.8ML PNKT; Inject 40 mg into the skin every 14 (fourteen) days.  Dispense: 2 each; Refill: 3  6. Need for hepatitis C screening test - Hepatitis C antibody  7. Iron (Fe) deficiency anemia Has improved with supplementation. Last check was about a month ago. - CBC with Differential/Platelet  She has a previously scheduled appointment with me in 4 weeks.  Fara Chute, PA-C Physician Assistant-Certified [facility]

## 2015-07-06 NOTE — Patient Instructions (Signed)
Monitor blood pressure 1-2/week. If it is consistently running <110/60, please let me know. We will plan to discontinue the fluid pill (HCTZ).  I will contact you with your lab results as soon as they are available.   If you have not heard from me in 2 weeks, please contact me.  The fastest way to get your results is to register for My Chart (see the instructions on the last page of this printout).

## 2015-07-09 ENCOUNTER — Encounter: Payer: Self-pay | Admitting: Physician Assistant

## 2015-07-28 ENCOUNTER — Encounter: Payer: BC Managed Care – PPO | Attending: General Surgery | Admitting: Dietician

## 2015-07-28 ENCOUNTER — Encounter: Payer: Self-pay | Admitting: Dietician

## 2015-07-28 DIAGNOSIS — Z713 Dietary counseling and surveillance: Secondary | ICD-10-CM | POA: Insufficient documentation

## 2015-07-28 DIAGNOSIS — Z6841 Body Mass Index (BMI) 40.0 and over, adult: Secondary | ICD-10-CM | POA: Insufficient documentation

## 2015-07-28 NOTE — Patient Instructions (Signed)
Goals:  Follow Phase 3B: High Protein + Non-Starchy Vegetables  Eat 3-6 small meals/snacks, every 3-5 hrs  Increase lean protein foods to meet 60g goal  Increase fluid intake to 64oz +  Avoid drinking 15 minutes before, during and 30 minutes after eating  Aim for >30 min of physical activity daily  Surgery date: 05/31/15 Surgery type: Sleeve gastrectomy Start weight at Laurie Hodge: 277.5 lbs on 04/08/2015 Weight today: 224 lbs Weight change: 27 lbs Total weight lost: 53.5 lbs  TANITA  BODY COMP RESULTS  05/18/15 06/15/15 07/28/15   BMI (kg/m^2) 44.8 40.5 36.2   Fat Mass (lbs) 143.5 133.5 104   Fat Free Mass (lbs) 134 117.5 120   Total Body Water (lbs) 98 86 88

## 2015-07-28 NOTE — Progress Notes (Signed)
  Follow-up visit:  8 Weeks Post-Operative sleeve gastrectomy Surgery  Medical Nutrition Therapy:  Appt start time: 250 end time:  310  Primary concerns today: Post-operative Bariatric Surgery Nutrition Management. Laurie Hodge returns today having lost another 27 lbs. She states she is excited about her weight loss. She has been experiencing air in her stomach although she does not use straws. Also reports stomach spasms with cold water. Recently had labs done and lipid panel was good.   Surgery date: 05/31/15 Surgery type: Sleeve gastrectomy Start weight at [facility]: 277.5 lbs on 04/08/2015 Weight today: 224 lbs Weight change: 27 lbs Total weight lost: 53.5 lbs  TANITA  BODY COMP RESULTS  05/18/15 06/15/15 07/28/15   BMI (kg/m^2) 44.8 40.5 36.2   Fat Mass (lbs) 143.5 133.5 104   Fat Free Mass (lbs) 134 117.5 120   Total Body Water (lbs) 98 86 88    Preferred Learning Style:   No preference indicated   Learning Readiness:   Ready  24-hr recall: B (AM): Triple Zero yogurt or egg and 2 pieces [place] bacon (10-15g) Snk (AM): none  L (PM): hamburger patty OR cheese and deli meat OR cottage cheese and jello (14g) Snk (PM): deli meat or bacon (7g)  D (PM): see lunch OR protein shake (14-30g) Snk (PM): sometimes jello   Fluid intake: jello, soups, water, protein shake (36-40 oz) Estimated total protein intake: 60 grams per day per patient (supplements with protein shake if needed)  Medications: see list (has follow up appointment with PCP soon) Supplementation: taking, sometimes forgets last Calcium  Using straws: no Drinking while eating: no Hair loss: yes Carbonated beverages: no N/V/D/C: nausea if she eats greasy foods; constipation Dumping syndrome: none  Recent physical activity:  Active at work (no formal exercise)  Progress Towards Goal(s):  In progress.  Handouts given during visit include:  Phase 3B lean protein + non starchy vegetables   Nutritional Diagnosis:   [place]-3.3 Overweight/obesity related to past poor dietary habits and physical inactivity as evidenced by patient w/ recent sleeve gastrectomy surgery following dietary guidelines for continued weight loss.     Intervention:  Nutrition counseling provided.  Teaching Method Utilized:  Visual Auditory Hands on  Barriers to learning/adherence to lifestyle change: none  Demonstrated degree of understanding via:  Teach Back   Monitoring/Evaluation:  Dietary intake, exercise, and body weight. Follow up in 6-8 weeks for 3.5-4 month post-op visit.

## 2015-07-31 ENCOUNTER — Other Ambulatory Visit: Payer: Self-pay | Admitting: Physician Assistant

## 2015-08-03 ENCOUNTER — Ambulatory Visit (INDEPENDENT_AMBULATORY_CARE_PROVIDER_SITE_OTHER): Payer: BC Managed Care – PPO | Admitting: Physician Assistant

## 2015-08-03 ENCOUNTER — Encounter: Payer: Self-pay | Admitting: Physician Assistant

## 2015-08-03 VITALS — BP 110/80 | HR 64 | Temp 98.9°F | Resp 16 | Ht 65.5 in | Wt 223.4 lb

## 2015-08-03 DIAGNOSIS — Z6836 Body mass index (BMI) 36.0-36.9, adult: Secondary | ICD-10-CM

## 2015-08-03 DIAGNOSIS — L659 Nonscarring hair loss, unspecified: Secondary | ICD-10-CM | POA: Diagnosis not present

## 2015-08-03 DIAGNOSIS — I10 Essential (primary) hypertension: Secondary | ICD-10-CM

## 2015-08-03 DIAGNOSIS — R6889 Other general symptoms and signs: Secondary | ICD-10-CM | POA: Diagnosis not present

## 2015-08-03 DIAGNOSIS — E785 Hyperlipidemia, unspecified: Secondary | ICD-10-CM

## 2015-08-03 NOTE — Progress Notes (Signed)
Patient ID: Laurie Hodge, female    DOB: [birth year], 55 y.o.   MRN: [id number]  PCP: Shannia Jacuinde, PA-C  Subjective:   Chief Complaint  Patient presents with  . Follow-up  . med check    pt would like to get off some meds, as it relates to wt loss  . Medication Refill    Omeprazole 40 mg    HPI Presents for weight check, medication review and refill.  s/p laparoscopic gastric sleeve resection on 05/31/15. She reports a 53.4lb weight loss in the last 2 months. She reports no complications with the surgery. She is still getting used to eating differently and reports some nausea and vomiting if she eats certain foods, too fast, or too much. She does complain of some constipation but states that she had constipation before the surgery which she has been told is due to her inadequate intake of fluids (32-40oz/day). She recently saw her nutritionist who graduated her diet to protein and vegetables. Pt complains of hair loss since the surgery. She denies any diarrhea.   No dumping syndrome issues too tired after work to exercise.   Lipid control was good at her last visit. BP is normal on the current regimen. No dizziness, lightheadedness or fatigue. Isn't checking at home. No change in OSA  Senna: occasionally.    Review of Systems Constitutional: Positive for appetite change. Negative for fever and chills.  HENT: Negative for hearing loss.  Eyes: Negative for visual disturbance.  Respiratory: Negative for cough and shortness of breath.  Cardiovascular: Negative for chest pain and leg swelling.  Gastrointestinal: Positive for constipation. Negative for nausea, abdominal pain and diarrhea. Vomiting: from eating certain foods or too fast.  Endocrine: Positive for cold intolerance (since the surgery).  Genitourinary: Negative for dysuria, frequency and difficulty urinating.  Musculoskeletal: Positive for arthralgias (Knees, hands, back chronic).  Neurological: Negative for  dizziness, light-headedness, numbness and headaches.     Patient Active Problem List   Diagnosis Date Noted  . Osteoarthritis 02/05/2015  . Hyperlipidemia 11/06/2013  . Rheumatoid arthritis ([place]) 06/10/2013  . BMI 36.0-36.9,adult   . GERD (gastroesophageal reflux disease)   . OSA on CPAP   . HTN (hypertension)   . HSV-2 infection   . Vitamin D insufficiency   . Iron (Fe) deficiency anemia      Prior to Admission medications   Medication Sig Start Date End Date Taking? Authorizing Provider  acetaminophen (TYLENOL) 500 MG tablet Take 1,000 mg by mouth every 6 (six) hours as needed (pain).   Yes Historical Provider, MD  Adalimumab (HUMIRA PEN) 40 MG/0.8ML PNKT Inject 40 mg into the skin every 14 (fourteen) days. 07/06/15  Yes Lateka Rady, PA-C  calcium-vitamin D (OSCAL WITH D) 500-200 MG-UNIT tablet Take 1 tablet by mouth 3 (three) times daily.   Yes Historical Provider, MD  cetirizine (ZYRTEC) 10 MG tablet Take 10 mg by mouth daily as needed for allergies.    Yes Historical Provider, MD  cholecalciferol (VITAMIN D) 1000 UNITS tablet Take 2,000 Units by mouth daily.    Yes Historical Provider, MD  Cyanocobalamin (VITAMIN B-12) 2500 MCG SUBL Place 2,500 mcg under the tongue daily.   Yes Historical Provider, MD  docusate sodium (COLACE) 100 MG capsule Take 100 mg by mouth 2 (two) times daily.   Yes Historical Provider, MD  Fish Oil-Krill Oil (KRILL OIL PLUS) CAPS Take 750 mg by mouth daily.   Yes Historical Provider, MD  hydrochlorothiazide (MICROZIDE) 12.5 MG capsule Take  1 capsule (12.5 mg total) by mouth daily. 12/29/14  Yes Akirah Storck, PA-C  iron polysaccharides (FERREX 150) 150 MG capsule TAKE ONE CAPSULE BY MOUTH TWICE DAILY Patient taking differently: Take 150 mg by mouth 2 (two) times daily.  12/29/14  Yes Sandrea Boer, PA-C  lisinopril (PRINIVIL,ZESTRIL) 20 MG tablet Take 1 tablet (20 mg total) by mouth 2 (two) times daily. 12/29/14  Yes Nahima Ales, PA-C  Multiple  Vitamin (MULTIVITAMIN WITH MINERALS) TABS tablet Take 1 tablet by mouth 2 (two) times daily.   Yes Historical Provider, MD  omeprazole (PRILOSEC) 40 MG capsule TAKE ONE CAPSULE BY MOUTH ONCE DAILY 08/03/15  Yes Terrence Wishon, PA-C  senna (SENOKOT) 8.6 MG TABS tablet Take 2 tablets by mouth as needed for mild constipation.   Yes Historical Provider, MD     Allergies  Allergen Reactions  . Adhesive [Tape] Rash    Skin Peels       Objective:  Physical Exam  Constitutional: She is oriented to person, place, and time. She appears well-developed and well-nourished. No distress.  BP 110/80 mmHg  Pulse 64  Temp(Src) 98.9 F (37.2 C) (Oral)  Resp 16  Ht 5' 5.5" (1.664 m)  Wt 223 lb 6.4 oz (101.334 kg)  BMI 36.60 kg/m2  SpO2 96%  LMP 07/11/2015   Eyes: Conjunctivae are normal. No scleral icterus.  Neck: No thyromegaly present.  Cardiovascular: Normal rate, regular rhythm, normal heart sounds and intact distal pulses.   Pulmonary/Chest: Effort normal and breath sounds normal.  Lymphadenopathy:    She has no cervical adenopathy.  Neurological: She is alert and oriented to person, place, and time.  Skin: Skin is warm and dry.  Psychiatric: She has a normal mood and affect. Her speech is normal and behavior is normal.           Assessment & Plan:   1. BMI 36.0-36.9,adult Congratulated on her hard work and weight loss so far. Continue healthy eating choices and add regular exercise to her routine. Would plan RTC in 4 weeks, but I will be out of the office, so plan 8 week follow-up.  2. Essential hypertension Controlled. She'll monitor at home and if consistently <110/80, she may stop the HCTZ.  3. Hyperlipidemia Normal lipid profile last month, off statin.  4. Hair loss This may be the stress of her recent surgery and weight loss, could also be nutritional deficit. Cannot exclude thyroid dysfunction. OK to take biotin. - TSH  5. Cold intolerance See above.   Fara Chute, PA-C Physician Assistant-Certified [facility]

## 2015-08-03 NOTE — Progress Notes (Signed)
Subjective:    Patient ID: Laurie Hodge, female    DOB: August 28, 1960, 55 y.o.   MRN: [id number]  HPI   Laurie Hodge is a 55 year old African American female s/p laparoscopic gastric sleeve resection on 05/31/15. She reports a 53lb weight loss in the last 2 months. She reports no complications with the surgery. She is still getting used to eating differently and reports some nausea and vomiting if she eats certain foods, too fast, or too much. She does complain of some constipation but states that she had constipation before the surgery which she has been told is due to her inadequate intake of fluids (32-40oz/day). She recently saw her nutritionist who graduated her diet to protein and vegetables. Pt complains of hair loss since the surgery. She denies any diarrhea.   Here last month. Had gastic sleeve surgery dec 12. bsaically here to make sure meds are at correct level  Surgery went well. Some   Nausea w/ anesthecias. Saw nutritionist lost 53.4lbs in last 2 mo. Decreased app. No real complications from surgery. Still gettnig used to eating things again. Chewing slower with meat, taking time w/ food or will vomit. Recently graduated from just proteins to just protein and vegetables. Still eating bad with superbowl and vday. Consistently constipated. But was even before surgery feels D2 inaequate water intake. Tries to increase but still only gets 32-40oz. Losing hair since surgery ++take biotin supp for hair/skin/nails++ reports having chunks of hair coming out when comb, breaks easily, and hase thinning spots in frontal area. Talked to nutritionist who reported that ti's normal. No dumping syndrome issues too tired after work to exercise. 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   Hx of hld but decreased at last visit BP in normal ranges w/ medication lisinopril, hctz No change in OSA  Senna: occasionally  PMH, FH, SH were reviewed  Allergies  Allergen Reactions  . Adhesive [Tape] Rash     Skin Peels   Prior to Admission medications   Medication Sig Start Date End Date Taking? Authorizing Provider  acetaminophen (TYLENOL) 500 MG tablet Take 1,000 mg by mouth every 6 (six) hours as needed (pain).   Yes Historical Provider, MD  Adalimumab (HUMIRA PEN) 40 MG/0.8ML PNKT Inject 40 mg into the skin every 14 (fourteen) days. 07/06/15  Yes Chelle Jeffery, PA-C  calcium-vitamin D (OSCAL WITH D) 500-200 MG-UNIT tablet Take 1 tablet by mouth 3 (three) times daily.   Yes Historical Provider, MD  cetirizine (ZYRTEC) 10 MG tablet Take 10 mg by mouth daily as needed for allergies.    Yes Historical Provider, MD  cholecalciferol (VITAMIN D) 1000 UNITS tablet Take 2,000 Units by mouth daily.    Yes Historical Provider, MD  Cyanocobalamin (VITAMIN B-12) 2500 MCG SUBL Place 2,500 mcg under the tongue daily.   Yes Historical Provider, MD  docusate sodium (COLACE) 100 MG capsule Take 100 mg by mouth 2 (two) times daily.   Yes Historical Provider, MD  Fish Oil-Krill Oil (KRILL OIL PLUS) CAPS Take 750 mg by mouth daily.   Yes Historical Provider, MD  hydrochlorothiazide (MICROZIDE) 12.5 MG capsule Take 1 capsule (12.5 mg total) by mouth daily. 12/29/14  Yes Chelle Jeffery, PA-C  iron polysaccharides (FERREX 150) 150 MG capsule TAKE ONE CAPSULE BY MOUTH TWICE DAILY Patient taking differently: Take 150 mg by mouth 2 (two) times daily.  12/29/14  Yes Chelle Jeffery, PA-C  lisinopril (PRINIVIL,ZESTRIL) 20 MG tablet Take 1 tablet (20 mg total) by mouth  2 (two) times daily. 12/29/14  Yes Chelle Jeffery, PA-C  Multiple Vitamin (MULTIVITAMIN WITH MINERALS) TABS tablet Take 1 tablet by mouth 2 (two) times daily.   Yes Historical Provider, MD  omeprazole (PRILOSEC) 40 MG capsule TAKE ONE CAPSULE BY MOUTH ONCE DAILY 08/03/15  Yes Chelle Jeffery, PA-C  senna (SENOKOT) 8.6 MG TABS tablet Take 2 tablets by mouth as needed for mild constipation.   Yes Historical Provider, MD     Review of Systems  Constitutional:  Positive for appetite change. Negative for fever and chills.  HENT: Negative for hearing loss.   Eyes: Negative for visual disturbance.  Respiratory: Negative for cough and shortness of breath.   Cardiovascular: Negative for chest pain and leg swelling.  Gastrointestinal: Positive for constipation. Negative for nausea, abdominal pain and diarrhea. Vomiting: from eating certain foods or too fast.  Endocrine: Positive for cold intolerance (since the surgery).  Genitourinary: Negative for dysuria, frequency and difficulty urinating.  Musculoskeletal: Positive for arthralgias (Knees, hands, back chronic).  Neurological: Negative for dizziness, light-headedness, numbness and headaches.       Objective:   Physical Exam  Constitutional: She is oriented to person, place, and time. She appears well-developed and well-nourished.  HENT:  Head: Normocephalic and atraumatic.  Eyes: Pupils are equal, round, and reactive to light.  Neck: Normal range of motion. Neck supple.  Cardiovascular: Normal rate and regular rhythm.  Exam reveals no gallop and no friction rub.   No murmur heard. Pulmonary/Chest: Effort normal. She has no wheezes. She has no rales. She exhibits no tenderness.  Abdominal: Soft. Bowel sounds are normal. She exhibits no distension and no mass. There is no tenderness.  Neurological: She is alert and oriented to person, place, and time.  Skin: Skin is warm and dry.  Psychiatric: She has a normal mood and affect. Her behavior is normal.    Blood pressure 110/80, pulse 64, temperature 98.9 F (37.2 C), temperature source Oral, resp. rate 16, height 5' 5.5" (1.664 m), weight 223 lb 6.4 oz (101.334 kg), last menstrual period 07/11/2015, SpO2 96 %.       Assessment & Plan:

## 2015-08-03 NOTE — Patient Instructions (Signed)
Please check blood pressure at home 2-3 times a week. If it is consistently under 110/80 you can stop taking hydrochlorothiazide (HCTZ) but continue checking your blood pressure. If it runs above 120/80 please restart the medication.   It is ok to take Biotin supplementation for hair loss.

## 2015-08-04 ENCOUNTER — Encounter: Payer: Self-pay | Admitting: Physician Assistant

## 2015-08-04 LAB — TSH: TSH: 1.28 m[IU]/L

## 2015-09-08 ENCOUNTER — Encounter: Payer: Self-pay | Admitting: Dietician

## 2015-09-08 ENCOUNTER — Encounter: Payer: BC Managed Care – PPO | Attending: General Surgery | Admitting: Dietician

## 2015-09-08 DIAGNOSIS — Z6841 Body Mass Index (BMI) 40.0 and over, adult: Secondary | ICD-10-CM | POA: Diagnosis not present

## 2015-09-08 DIAGNOSIS — Z713 Dietary counseling and surveillance: Secondary | ICD-10-CM | POA: Insufficient documentation

## 2015-09-08 NOTE — Patient Instructions (Addendum)
Goals:  Follow Phase 3B: High Protein + Non-Starchy Vegetables  Eat 3-6 small meals/snacks, every 3-5 hrs  Increase lean protein foods to meet 60g goal  Increase fluid intake to 64oz +, try to set reminders in phone to sips  Avoid drinking 15 minutes before, during and 30 minutes after eating  Aim for >30 min of physical activity daily (start BELT program)  Continue to not have ice cream around the house  Surgery date: 05/31/15 Surgery type: Sleeve gastrectomy Start weight at [facility]: 277.5 lbs on 04/08/2015 Weight today: 215.5 lbs Weight change: 8.5 lbs Total weight lost: 62 lbs Weight loss goal: 180 lbs  TANITA  BODY COMP RESULTS  05/18/15 06/15/15 07/28/15 09/08/15   BMI (kg/m^2) 44.8 40.5 36.2 34.8   Fat Mass (lbs) 143.5 133.5 104 95.0   Fat Free Mass (lbs) 134 117.5 120 120.5   Total Body Water (lbs) 98 86 88 88.0

## 2015-09-08 NOTE — Progress Notes (Signed)
  Follow-up visit:  3 Months Post-Operative sleeve gastrectomy Surgery  Medical Nutrition Therapy:  Appt start time: 415 end time:  445  Primary concerns today: Post-operative Bariatric Surgery Nutrition Management. Riley returns today having lost another 8.5 lbs. Feels like she fell off the wagon in the past month. 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 Found that no foods made her sick. Stopped having ice cream in the past week.  Planning to sign up for BELT next month.  Surgery date: 05/31/15 Surgery type: Sleeve gastrectomy Start weight at [facility]: 277.5 lbs on 04/08/2015 Weight today: 215.5 lbs  Weight change: 8.5 lbs Total weight lost: 62 lbs Weight loss goal: 180 lbs  TANITA  BODY COMP RESULTS  05/18/15 06/15/15 07/28/15 09/08/15   BMI (kg/m^2) 44.8 40.5 36.2 34.8   Fat Mass (lbs) 143.5 133.5 104 95.0   Fat Free Mass (lbs) 134 117.5 120 120.5   Total Body Water (lbs) 98 86 88 88.0    Preferred Learning Style:   No preference indicated   Learning Readiness:   Ready  24-hr recall: B (AM): Premier protein or egg and 2 pieces [place] bacon on weekend (15-30g) Snk (AM): none  L (PM): hamburger patty OR cheese and deli meat OR cottage cheese and jello (14g) Snk (PM): deli meat or cheese 7g)  D (PM): [place] patty or meat/shrimp with vegetables  (~30g) Snk (PM): sometimes ice cream  Fluid intake: jello, soups, water, protein shake (36-40 oz) Estimated total protein intake: 66-81 grams per day   Medications: see list (has follow up appointment with PCP soon) Supplementation: taking, sometimes forgets last Calcium, taking iron also  Using straws: no Drinking while eating: no Hair loss: yes Carbonated beverages: no N/V/D/C: gets air trapped when drinking which makes her nauseas; constipation and takes colace Dumping syndrome: none  Recent physical activity:  Active at work (no formal  exercise)  Progress Towards Goal(s):  In progress.  Handouts given during visit include:  none   Nutritional Diagnosis:  [place]-3.3 Overweight/obesity related to past poor dietary habits and physical inactivity as evidenced by patient w/ recent sleeve gastrectomy surgery following dietary guidelines for continued weight loss.     Intervention:  Nutrition counseling provided. Goals:  Follow Phase 3B: High Protein + Non-Starchy Vegetables  Eat 3-6 small meals/snacks, every 3-5 hrs  Increase lean protein foods to meet 60g goal  Increase fluid intake to 64oz +, try to set reminders in phone to sips  Avoid drinking 15 minutes before, during and 30 minutes after eating  Aim for >30 min of physical activity daily (start BELT program)  Continue to not have ice cream around the house  Teaching Method Utilized:  Visual Auditory Hands on  Barriers to learning/adherence to lifestyle change: none  Demonstrated degree of understanding via:  Teach Back   Monitoring/Evaluation:  Dietary intake, exercise, and body weight. Follow up in 3 months  for 6 month post-op visit.

## 2015-10-05 ENCOUNTER — Encounter: Payer: Self-pay | Admitting: Physician Assistant

## 2015-10-05 ENCOUNTER — Ambulatory Visit (INDEPENDENT_AMBULATORY_CARE_PROVIDER_SITE_OTHER): Payer: BC Managed Care – PPO | Admitting: Physician Assistant

## 2015-10-05 VITALS — BP 115/79 | HR 56 | Temp 98.6°F | Resp 16 | Ht 66.0 in | Wt 204.0 lb

## 2015-10-05 DIAGNOSIS — G4733 Obstructive sleep apnea (adult) (pediatric): Secondary | ICD-10-CM | POA: Diagnosis not present

## 2015-10-05 DIAGNOSIS — J069 Acute upper respiratory infection, unspecified: Secondary | ICD-10-CM | POA: Diagnosis not present

## 2015-10-05 DIAGNOSIS — E785 Hyperlipidemia, unspecified: Secondary | ICD-10-CM

## 2015-10-05 DIAGNOSIS — I10 Essential (primary) hypertension: Secondary | ICD-10-CM | POA: Diagnosis not present

## 2015-10-05 DIAGNOSIS — Z9989 Dependence on other enabling machines and devices: Secondary | ICD-10-CM

## 2015-10-05 DIAGNOSIS — B9789 Other viral agents as the cause of diseases classified elsewhere: Secondary | ICD-10-CM

## 2015-10-05 MED ORDER — IPRATROPIUM BROMIDE 0.03 % NA SOLN: 2.0000 | Freq: Two times a day (BID) | NASAL | Status: DC

## 2015-10-05 MED ORDER — BENZONATATE 100 MG PO CAPS: 100.0000 mg | ORAL_CAPSULE | Freq: Three times a day (TID) | ORAL | Status: DC | PRN

## 2015-10-05 NOTE — Progress Notes (Signed)
Patient ID: Laurie Hodge, female    DOB: [birth year], 55 y.o.   MRN: [id number]  PCP: Meghana Tullo, PA-C  Subjective:   Chief Complaint  Patient presents with  . Follow-up  . Hypertension  . Medication Management    HPI Presents for hypertension follow up.  She is doing great. She just returned from [place] for vacation with her daughter.   She does have some sinus congestion, clear rhinorrhea, productive cough with green-yellow sputum production, sore throat and ear fullness that started last Thursday. Denies any fever, chills, N/V/D, abd pain. Of note patient takes a Electronics engineer daily.  She continues to lose weight. She has lost a total of 72 lbs since her surgery in December. She had laparoscopic gastric sleeve resection on 05/31/15. She is starting an exercise class at [facility] through the bariatric center next month.  She states hair loss has improved since being seen in February. However, she states her hair remains thin and course.  Last visit the plan was to d/c the HCTZ if bp are consistently <110/80. Patient stopped taking her HCTZ last week but her bp started to increase to the A999333 systolic. She resumed taking her medication.   Of note patient also admits to not using her CPAP for OSA over the past 3 months. She did not think she needed it anymore.    Review of Systems Constitutional: Negative for fever, chills and fatigue.  HENT: Positive for congestion, ear pain, postnasal drip, rhinorrhea, sinus pressure and sore throat.  Eyes: Negative.  Respiratory: Positive for cough (productive). Negative for shortness of breath.  Cardiovascular: Negative for chest pain.  Gastrointestinal: Negative.  Genitourinary: Negative.  Neurological: Negative for dizziness, syncope, weakness, light-headedness and headaches.     Patient Active Problem List   Diagnosis Date Noted  . Osteoarthritis 02/05/2015  . Hyperlipidemia 11/06/2013  . Rheumatoid arthritis ([place])  06/10/2013  . BMI 36.0-36.9,adult   . GERD (gastroesophageal reflux disease)   . OSA on CPAP   . HTN (hypertension)   . HSV-2 infection   . Vitamin D insufficiency   . Iron (Fe) deficiency anemia      Prior to Admission medications   Medication Sig Start Date End Date Taking? Authorizing Provider  acetaminophen (TYLENOL) 500 MG tablet Take 1,000 mg by mouth every 6 (six) hours as needed (pain).   Yes Historical Provider, MD  Adalimumab (HUMIRA PEN) 40 MG/0.8ML PNKT Inject 40 mg into the skin every 14 (fourteen) days. 07/06/15  Yes Dahna Hattabaugh, PA-C  Biotin 5 MG CAPS Take by mouth.   Yes Historical Provider, MD  calcium-vitamin D (OSCAL WITH D) 500-200 MG-UNIT tablet Take 1 tablet by mouth 3 (three) times daily.   Yes Historical Provider, MD  cetirizine (ZYRTEC) 10 MG tablet Take 10 mg by mouth daily as needed for allergies.    Yes Historical Provider, MD  cholecalciferol (VITAMIN D) 1000 UNITS tablet Take 2,000 Units by mouth daily.    Yes Historical Provider, MD  Cyanocobalamin (VITAMIN B-12) 2500 MCG SUBL Place 2,500 mcg under the tongue daily.   Yes Historical Provider, MD  docusate sodium (COLACE) 100 MG capsule Take 100 mg by mouth 2 (two) times daily.   Yes Historical Provider, MD  Fish Oil-Krill Oil (KRILL OIL PLUS) CAPS Take 750 mg by mouth daily.   Yes Historical Provider, MD  hydrochlorothiazide (MICROZIDE) 12.5 MG capsule Take 1 capsule (12.5 mg total) by mouth daily. 12/29/14  Yes Charlen Bakula, PA-C  iron polysaccharides [facility]  150) 150 MG capsule TAKE ONE CAPSULE BY MOUTH TWICE DAILY Patient taking differently: Take 150 mg by mouth 2 (two) times daily.  12/29/14  Yes Vieva Brummitt, PA-C  lisinopril (PRINIVIL,ZESTRIL) 20 MG tablet Take 1 tablet (20 mg total) by mouth 2 (two) times daily. 12/29/14  Yes Oluwademilade Mckiver, PA-C  Multiple Vitamin (MULTIVITAMIN WITH MINERALS) TABS tablet Take 1 tablet by mouth 2 (two) times daily.   Yes Historical Provider, MD  omeprazole (PRILOSEC)  40 MG capsule TAKE ONE CAPSULE BY MOUTH ONCE DAILY 08/03/15  Yes Kyshon Tolliver, PA-C  senna (SENOKOT) 8.6 MG TABS tablet Take 2 tablets by mouth as needed for mild constipation.   Yes Historical Provider, MD     Allergies  Allergen Reactions  . Adhesive [Tape] Rash    Skin Peels       Objective:  Physical Exam  Constitutional: She is oriented to person, place, and time. She appears well-developed and well-nourished. She is active and cooperative. No distress.  BP 115/79 mmHg  Pulse 56  Temp(Src) 98.6 F (37 C)  Resp 16  Ht 5\' 6"  (1.676 m)  Wt 204 lb (92.534 kg)  BMI 32.94 kg/m2  HENT:  Head: Normocephalic and atraumatic.  Right Ear: Hearing, tympanic membrane, external ear and ear canal normal.  Left Ear: Hearing, tympanic membrane, external ear and ear canal normal.  Nose: Mucosal edema and rhinorrhea present.  Mouth/Throat: Uvula is midline, oropharynx is clear and moist and mucous membranes are normal. No oral lesions. Normal dentition. No uvula swelling.  Eyes: Conjunctivae are normal. No scleral icterus.  Neck: Normal range of motion, full passive range of motion without pain and phonation normal. Neck supple. No thyromegaly present.  Cardiovascular: Normal rate, regular rhythm and normal heart sounds.   Pulses:      Radial pulses are 2+ on the right side, and 2+ on the left side.  Pulmonary/Chest: Effort normal and breath sounds normal.  Lymphadenopathy:       Head (right side): No tonsillar, no preauricular, no posterior auricular and no occipital adenopathy present.       Head (left side): No tonsillar, no preauricular, no posterior auricular and no occipital adenopathy present.    She has no cervical adenopathy.       Right: No supraclavicular adenopathy present.       Left: No supraclavicular adenopathy present.  Neurological: She is alert and oriented to person, place, and time. No sensory deficit.  Skin: Skin is warm, dry and intact. No rash noted. No cyanosis or  erythema. Nails show no clubbing.  Psychiatric: She has a normal mood and affect. Her speech is normal and behavior is normal.           Assessment & Plan:   1. Essential hypertension Controlled. Continue current treatment.  2. OSA on CPAP Needs updated titration, given her significant weight loss. - Ambulatory referral to Sleep Studies  3. Hyperlipidemia Stable.  4. Viral URI with cough Supportive care. Anticipatory guidance. - ipratropium (ATROVENT) 0.03 % nasal spray; Place 2 sprays into both nostrils 2 (two) times daily.  Dispense: 30 mL; Refill: 0 - benzonatate (TESSALON) 100 MG capsule; Take 1-2 capsules (100-200 mg total) by mouth 3 (three) times daily as needed for cough.  Dispense: 40 capsule; Refill: 0   Fara Chute, PA-C Physician Assistant-Certified [facility]

## 2015-10-05 NOTE — Progress Notes (Signed)
Subjective:    Patient ID: Laurie Hodge, female    DOB: 1960-08-23, 55 y.o.   MRN: [id number]  Chief Complaint  Patient presents with  . Follow-up  . Hypertension  . Medication Management    HPI  Patient presents today for hypertension follow up.  She is doing great. She just returned from [place] for vacation with her daughter.   She does have some sinus congestion, clear rhinorrhea, productive cough with green-yellow sputum production, sore throat and ear fullness that started last Thursday. Denies any fever, chills, N/V/D, abd pain. Of note patient takes a Electronics engineer daily.  She continues to lose weight. She has lost a total of 72 lbs since her surgery in December. She had laparoscopic gastric sleeve resection on 05/31/15. She is starting an exercise class at [facility] through the bariatric center next month.  She states hair loss has improved since being seen in February. However, she states her hair remains thin and course.  Last visit the plan was to d/c the HCTZ if bp are consistently <110/80. Patient stopped taking her HCTZ last week but her bp started to increase to the A999333 systolic. She resumed taking her medication.   Of note patient also admits to not using her CPAP for OSA over the past 3 months. She did not think she needed it anymore.   Current Outpatient Prescriptions on File Prior to Visit  Medication Sig Dispense Refill  . acetaminophen (TYLENOL) 500 MG tablet Take 1,000 mg by mouth every 6 (six) hours as needed (pain).    . Adalimumab (HUMIRA PEN) 40 MG/0.8ML PNKT Inject 40 mg into the skin every 14 (fourteen) days. 2 each 3  . calcium-vitamin D (OSCAL WITH D) 500-200 MG-UNIT tablet Take 1 tablet by mouth 3 (three) times daily.    . cetirizine (ZYRTEC) 10 MG tablet Take 10 mg by mouth daily as needed for allergies.     . cholecalciferol (VITAMIN D) 1000 UNITS tablet Take 2,000 Units by mouth daily.     . Cyanocobalamin (VITAMIN B-12) 2500 MCG SUBL Place  2,500 mcg under the tongue daily.    Marland Kitchen docusate sodium (COLACE) 100 MG capsule Take 100 mg by mouth 2 (two) times daily.    . Fish Oil-Krill Oil (KRILL OIL PLUS) CAPS Take 750 mg by mouth daily.    . hydrochlorothiazide (MICROZIDE) 12.5 MG capsule Take 1 capsule (12.5 mg total) by mouth daily. 90 capsule 3  . iron polysaccharides (FERREX 150) 150 MG capsule TAKE ONE CAPSULE BY MOUTH TWICE DAILY (Patient taking differently: Take 150 mg by mouth 2 (two) times daily. ) 180 capsule 3  . lisinopril (PRINIVIL,ZESTRIL) 20 MG tablet Take 1 tablet (20 mg total) by mouth 2 (two) times daily. 180 tablet 3  . Multiple Vitamin (MULTIVITAMIN WITH MINERALS) TABS tablet Take 1 tablet by mouth 2 (two) times daily.    Marland Kitchen omeprazole (PRILOSEC) 40 MG capsule TAKE ONE CAPSULE BY MOUTH ONCE DAILY 90 capsule 1  . senna (SENOKOT) 8.6 MG TABS tablet Take 2 tablets by mouth as needed for mild constipation.     No current facility-administered medications on file prior to visit.   Allergies  Allergen Reactions  . Adhesive [Tape] Rash    Skin Peels     Review of Systems  Constitutional: Negative for fever, chills and fatigue.  HENT: Positive for congestion, ear pain, postnasal drip, rhinorrhea, sinus pressure and sore throat.   Eyes: Negative.   Respiratory: Positive for cough (productive).  Negative for shortness of breath.   Cardiovascular: Negative for chest pain.  Gastrointestinal: Negative.   Genitourinary: Negative.   Neurological: Negative for dizziness, syncope, weakness, light-headedness and headaches.       Objective:   Physical Exam  Constitutional: She is oriented to person, place, and time. She appears well-developed and well-nourished.  HENT:  Head: Normocephalic and atraumatic.  Right Ear: External ear normal.  Left Ear: External ear normal.  Nose: Mucosal edema and rhinorrhea present. No sinus tenderness. Right sinus exhibits maxillary sinus tenderness and frontal sinus tenderness. Left sinus  exhibits maxillary sinus tenderness and frontal sinus tenderness.  Mouth/Throat: Posterior oropharyngeal erythema present. No oropharyngeal exudate or posterior oropharyngeal edema.  Eyes: Conjunctivae and EOM are normal. Pupils are equal, round, and reactive to light.  Neck: Normal range of motion. Neck supple. No thyromegaly present.  Cardiovascular: Normal rate, regular rhythm, normal heart sounds and intact distal pulses.   Pulmonary/Chest: Effort normal and breath sounds normal.  Abdominal: Soft. Bowel sounds are normal. There is no tenderness.  Lymphadenopathy:    She has no cervical adenopathy.  Neurological: She is alert and oriented to person, place, and time.  Skin: Skin is warm and dry.  Psychiatric: She has a normal mood and affect. Her behavior is normal. Judgment and thought content normal.          Assessment & Plan:  1. Essential hypertension Continue current medication regimen. Continue to monitor bp at home.  2. OSA on CPAP Patient referred for another sleep study for re evaluation after gastric sleeve surgery. Patient has not been using her CPAP for the past 3 months. - Ambulatory referral to Sleep Studies  3. Hyperlipidemia Continue current regimen.   4. Viral URI with cough - ipratropium (ATROVENT) 0.03 % nasal spray; Place 2 sprays into both nostrils 2 (two) times daily.  Dispense: 30 mL; Refill: 0 - benzonatate (TESSALON) 100 MG capsule; Take 1-2 capsules (100-200 mg total) by mouth 3 (three) times daily as needed for cough.  Dispense: 40 capsule; Refill: 0  Melina Schools PA-S 10/05/2015

## 2015-10-05 NOTE — Patient Instructions (Signed)
     IF you received an x-ray today, you will receive an invoice from [facility]. Please contact [facility] at [phone] with questions or concerns regarding your invoice.   IF you received labwork today, you will receive an invoice from [facility]. Please contact [facility] at [phone] with questions or concerns regarding your invoice.   Our billing staff will not be able to assist you with questions regarding bills from these companies.  You will be contacted with the lab results as soon as they are available. The fastest way to get your results is to activate your My Chart account. Instructions are located on the last page of this paperwork. If you have not heard from us regarding the results in 2 weeks, please contact this office.      

## 2015-10-12 ENCOUNTER — Ambulatory Visit (INDEPENDENT_AMBULATORY_CARE_PROVIDER_SITE_OTHER): Payer: BC Managed Care – PPO | Admitting: Urgent Care

## 2015-10-12 VITALS — BP 130/82 | HR 80 | Temp 98.1°F | Resp 16 | Ht 66.0 in | Wt 208.0 lb

## 2015-10-12 DIAGNOSIS — N3090 Cystitis, unspecified without hematuria: Secondary | ICD-10-CM | POA: Diagnosis not present

## 2015-10-12 DIAGNOSIS — N898 Other specified noninflammatory disorders of vagina: Secondary | ICD-10-CM

## 2015-10-12 DIAGNOSIS — R300 Dysuria: Secondary | ICD-10-CM

## 2015-10-12 LAB — POC MICROSCOPIC URINALYSIS (UMFC)

## 2015-10-12 LAB — POCT URINALYSIS DIP (MANUAL ENTRY)
Bilirubin, UA: NEGATIVE
Glucose, UA: NEGATIVE
NITRITE UA: POSITIVE — AB
PH UA: 6
RBC UA: NEGATIVE
Spec Grav, UA: 1.02
UROBILINOGEN UA: 1

## 2015-10-12 LAB — POCT WET + KOH PREP
TRICH BY WET PREP: ABSENT
Yeast by KOH: ABSENT
Yeast by wet prep: ABSENT

## 2015-10-12 MED ORDER — CIPROFLOXACIN HCL 500 MG PO TABS: 500.0000 mg | ORAL_TABLET | Freq: Two times a day (BID) | ORAL | Status: DC

## 2015-10-12 MED ORDER — FLUCONAZOLE 150 MG PO TABS: 150.0000 mg | ORAL_TABLET | Freq: Once | ORAL | Status: DC

## 2015-10-12 NOTE — Progress Notes (Signed)
MRN: [id number] DOB: 03-17-1961  Subjective:   Laurie Hodge is a 55 y.o. female presenting for chief complaint of Dysuria  Reports 4 day history of fever (highest was 100.50F), dysuria, urinary frequency, flank pain and pelvic pain, excessive thick Durfey vaginal discharge. Has tried Azo with minimal relief. Denies having difficulty with frequent UTIs.   Laurie Hodge has a current medication list which includes the following prescription(s): acetaminophen, adalimumab, biotin, calcium-vitamin d, cetirizine, cholecalciferol, vitamin b-12, docusate sodium, krill oil plus, hydrochlorothiazide, iron polysaccharides, lisinopril, multivitamin with minerals, omeprazole, benzonatate, ipratropium, and senna. Patient is allergic to adhesive.  Laurie Hodge  has a past medical history of Obesity, Class III, BMI 40-49.9 (morbid obesity) ([place]); GERD (gastroesophageal reflux disease); HTN (hypertension); HSV-2 infection; Vitamin D insufficiency; Hemorrhoid; Iron (Fe) deficiency anemia; Hyperlipidemia; Allergy; Depression; Ulcer; Arthritis; OSA on CPAP; and OSA on CPAP. Also  has past surgical history that includes Tubal ligation and Laparoscopic gastric sleeve resection (N/A, 05/31/2015).  Objective:   Vitals: BP 130/82 mmHg  Pulse 80  Temp(Src) 98.1 F (36.7 C) (Oral)  Resp 16  Ht 5\' 6"  (1.676 m)  Wt 208 lb (94.348 kg)  BMI 33.59 kg/m2  SpO2 96%  LMP 09/12/2015  Physical Exam  Constitutional: She is oriented to person, place, and time. She appears well-developed and well-nourished.  Cardiovascular: Normal rate, regular rhythm and intact distal pulses.  Exam reveals no gallop and no friction rub.   No murmur heard. Pulmonary/Chest: No respiratory distress. She has no wheezes. She has no rales.  Abdominal: Soft. Bowel sounds are normal. She exhibits no distension and no mass. There is tenderness (lower pelvic).  Neurological: She is alert and oriented to person, place, and time.  Skin: Skin is warm and dry.     Results for orders placed or performed in visit on 10/12/15 (from the past 72 hour(s))  POCT urinalysis dipstick     Status: Abnormal   Collection Time: 10/12/15  6:37 PM  Result Value Ref Range   Color, UA orange (A) yellow   Clarity, UA clear clear   Glucose, UA negative negative   Bilirubin, UA negative negative   Ketones, POC UA trace (5) (A) negative   Spec Grav, UA 1.020    Blood, UA negative negative   pH, UA 6.0    Protein Ur, POC trace (A) negative   Urobilinogen, UA 1.0    Nitrite, UA Positive (A) Negative   Leukocytes, UA small (1+) (A) Negative  POCT Microscopic Urinalysis ([facility])     Status: Abnormal   Collection Time: 10/12/15  6:37 PM  Result Value Ref Range   WBC,UR,HPF,POC Many (A) None WBC/hpf   RBC,UR,HPF,POC None None RBC/hpf   Bacteria Moderate (A) None, Too numerous to count   Mucus Present (A) Absent   Epithelial Cells, UR Per Microscopy Moderate (A) None, Too numerous to count cells/hpf  POCT Wet + KOH Prep     Status: Abnormal   Collection Time: 10/12/15  7:09 PM  Result Value Ref Range   Yeast by KOH Absent Present, Absent   Yeast by wet prep Absent Present, Absent   WBC by wet prep Moderate (A) None, Few, Too numerous to count   Clue Cells Wet Prep HPF POC Moderate (A) None, Too numerous to count   Trich by wet prep Absent Present, Absent   Bacteria Wet Prep HPF POC Many (A) None, Few, Too numerous to count   Epithelial Cells By [employer] Pref ([facility]) Many (A) None, Few, Too  numerous to count   RBC,UR,HPF,POC None None RBC/hpf    Assessment and Plan :   1. Cystitis 2. Dysuria - Urine culture pending, start ciprofloxacin.  3. Vaginal discharge - Will f/u after patient starts antibiotic. Consider having patient start Flagyl if discharge persists.  Jaynee Eagles, PA-C [facility] [phone] 10/12/2015 6:47 PM

## 2015-10-12 NOTE — Patient Instructions (Addendum)
Urinary Tract Infection Urinary tract infections (UTIs) can develop anywhere along your urinary tract. Your urinary tract is your body's drainage system for removing wastes and extra water. Your urinary tract includes two kidneys, two ureters, a bladder, and a urethra. Your kidneys are a pair of bean-shaped organs. Each kidney is about the size of your fist. They are located below your ribs, one on each side of your spine. CAUSES Infections are caused by microbes, which are microscopic organisms, including fungi, viruses, and bacteria. These organisms are so small that they can only be seen through a microscope. Bacteria are the microbes that most commonly cause UTIs. SYMPTOMS  Symptoms of UTIs may vary by age and gender of the patient and by the location of the infection. Symptoms in young women typically include a frequent and intense urge to urinate and a painful, burning feeling in the bladder or urethra during urination. Older women and men are more likely to be tired, shaky, and weak and have muscle aches and abdominal pain. A fever may mean the infection is in your kidneys. Other symptoms of a kidney infection include pain in your back or sides below the ribs, nausea, and vomiting. DIAGNOSIS To diagnose a UTI, your caregiver will ask you about your symptoms. Your caregiver will also ask you to provide a urine sample. The urine sample will be tested for bacteria and white blood cells. White blood cells are made by your body to help fight infection. TREATMENT  Typically, UTIs can be treated with medication. Because most UTIs are caused by a bacterial infection, they usually can be treated with the use of antibiotics. The choice of antibiotic and length of treatment depend on your symptoms and the type of bacteria causing your infection. HOME CARE INSTRUCTIONS  If you were prescribed antibiotics, take them exactly as your caregiver instructs you. Finish the medication even if you feel better after  you have only taken some of the medication.  Drink enough water and fluids to keep your urine clear or pale yellow.  Avoid caffeine, tea, and carbonated beverages. They tend to irritate your bladder.  Empty your bladder often. Avoid holding urine for long periods of time.  Empty your bladder before and after sexual intercourse.  After a bowel movement, women should cleanse from front to back. Use each tissue only once. SEEK MEDICAL CARE IF:   You have back pain.  You develop a fever.  Your symptoms do not begin to resolve within 3 days. SEEK IMMEDIATE MEDICAL CARE IF:   You have severe back pain or lower abdominal pain.  You develop chills.  You have nausea or vomiting.  You have continued burning or discomfort with urination. MAKE SURE YOU:   Understand these instructions.  Will watch your condition.  Will get help right away if you are not doing well or get worse.   This information is not intended to replace advice given to you by your health care provider. Make sure you discuss any questions you have with your health care provider.   Document Released: 03/15/2005 Document Revised: 02/24/2015 Document Reviewed: 07/14/2011 Elsevier Interactive Patient Education 2016 [employer].     IF you received an x-ray today, you will receive an invoice from [facility]. Please contact [facility] at [phone] with questions or concerns regarding your invoice.   IF you received labwork today, you will receive an invoice from [employer]. Please contact [facility] at [phone] with questions or concerns regarding your invoice.  Our billing staff will not be able to assist you with questions regarding bills from these companies.  You will be contacted with the lab results as soon as they are available. The fastest way to get your results is to activate your My Chart account. Instructions are located on the last page of this  paperwork. If you have not heard from us regarding the results in 2 weeks, please contact this office.     

## 2015-10-14 ENCOUNTER — Encounter: Payer: Self-pay | Admitting: Urgent Care

## 2015-10-14 ENCOUNTER — Institutional Professional Consult (permissible substitution): Payer: BC Managed Care – PPO | Admitting: Neurology

## 2015-10-14 LAB — URINE CULTURE
COLONY COUNT: NO GROWTH
ORGANISM ID, BACTERIA: NO GROWTH

## 2015-10-16 ENCOUNTER — Telehealth: Payer: Self-pay | Admitting: Urgent Care

## 2015-10-16 MED ORDER — SULFAMETHOXAZOLE-TRIMETHOPRIM 800-160 MG PO TABS: 1.0000 | ORAL_TABLET | Freq: Two times a day (BID) | ORAL | Status: DC

## 2015-10-16 NOTE — Telephone Encounter (Signed)
Reports improvement in her urinary symptoms but still has dysuria despite taking Cipro consistently. Unfortunately, her urine culture was negative despite having +Nitrites, +Leukocytes, bacteria and wbc in her urinalysis. Patient opted to add Septra on to cipro and she will rtc Monday if she is not having any improvement.

## 2015-10-19 ENCOUNTER — Ambulatory Visit (INDEPENDENT_AMBULATORY_CARE_PROVIDER_SITE_OTHER): Payer: BC Managed Care – PPO | Admitting: Physician Assistant

## 2015-10-19 VITALS — BP 118/72 | HR 73 | Temp 99.0°F | Resp 18 | Ht 66.0 in | Wt 205.8 lb

## 2015-10-19 DIAGNOSIS — R350 Frequency of micturition: Secondary | ICD-10-CM | POA: Diagnosis not present

## 2015-10-19 LAB — POCT URINALYSIS DIP (MANUAL ENTRY)
Bilirubin, UA: NEGATIVE
GLUCOSE UA: NEGATIVE
Ketones, POC UA: NEGATIVE
NITRITE UA: NEGATIVE
PH UA: 6.5
RBC UA: NEGATIVE
SPEC GRAV UA: 1.025
UROBILINOGEN UA: 0.2

## 2015-10-19 LAB — POC MICROSCOPIC URINALYSIS (UMFC)

## 2015-10-19 NOTE — Progress Notes (Signed)
10/19/2015 6:44 PM   DOB: 1961-06-12 / MRN: [id number]  SUBJECTIVE:  Laurie Hodge is a 55 y.o. female presenting for a recheck of UTI. She saw PA Mani roughly 1 week ago and was started on Cipro given urine leukocytes and nitrites.  She clearly had a UTI per those results and her symptoms.  She saw little improvement with Cipro and her culture was negative for growth.  She called and was started on SMX-TMP.  Within 48-72 hours of starting this she saw a 70% improvement in her symptoms.  She continues to have mild dysuria, denies flank pain, fever, chills.    She is allergic to adhesive.   She  has a past medical history of Obesity, Class III, BMI 40-49.9 (morbid obesity) ([place]); GERD (gastroesophageal reflux disease); HTN (hypertension); HSV-2 infection; Vitamin D insufficiency; Hemorrhoid; Iron (Fe) deficiency anemia; Hyperlipidemia; Allergy; Depression; Ulcer; Arthritis; OSA on CPAP; and OSA on CPAP.    She  reports that she has never smoked. She has never used smokeless tobacco. She reports that she drinks alcohol. She reports that she does not use illicit drugs. She  reports that she does not currently engage in sexual activity but has had female partners. The patient  has past surgical history that includes Tubal ligation and Laparoscopic gastric sleeve resection (N/A, 05/31/2015).  Her family history includes Cancer in her paternal aunt; Dementia in her mother; Diabetes in her brother, mother, and sister; Heart disease in her father, sister, and sister; Hyperlipidemia in her brother, father, mother, sister, and sister; Hypertension in her brother, father, mother, sister, and sister; Stroke in her father; Stroke (age of onset: 71) in her mother. There is no history of Colon cancer, Esophageal cancer, Rectal cancer, or Stomach cancer.  Review of Systems  Constitutional: Negative for fever.  Genitourinary: Positive for dysuria (improved) and frequency (improved).  Neurological: Negative for  dizziness and headaches.    Problem list and medications reviewed and updated by myself where necessary, and exist elsewhere in the encounter.   OBJECTIVE:  BP 118/72 mmHg  Pulse 73  Temp(Src) 99 F (37.2 C) (Oral)  Resp 18  Ht 5\' 6"  (1.676 m)  Wt 205 lb 12.8 oz (93.35 kg)  BMI 33.23 kg/m2  SpO2 100%  LMP 09/12/2015  Physical Exam  Constitutional: She is oriented to person, place, and time. She appears well-nourished. No distress.  Eyes: EOM are normal. Pupils are equal, round, and reactive to light.  Cardiovascular: Normal rate.   Pulmonary/Chest: Effort normal.  Abdominal: She exhibits no distension. There is no tenderness. There is no CVA tenderness.  Neurological: She is alert and oriented to person, place, and time. No cranial nerve deficit. Gait normal.  Skin: Skin is dry. She is not diaphoretic.  Psychiatric: She has a normal mood and affect.  Vitals reviewed.   Results for orders placed or performed in visit on 10/19/15 (from the past 72 hour(s))  POCT urinalysis dipstick     Status: Abnormal   Collection Time: 10/19/15  6:12 PM  Result Value Ref Range   Color, UA yellow yellow   Clarity, UA hazy (A) clear   Glucose, UA negative negative   Bilirubin, UA negative negative   Ketones, POC UA negative negative   Spec Grav, UA 1.025    Blood, UA negative negative   pH, UA 6.5    Protein Ur, POC trace (A) negative   Urobilinogen, UA 0.2    Nitrite, UA Negative Negative   Leukocytes, UA  Trace (A) Negative  POCT Microscopic Urinalysis ([facility])     Status: Abnormal   Collection Time: 10/19/15  6:21 PM  Result Value Ref Range   WBC,UR,HPF,POC Moderate (A) None WBC/hpf   RBC,UR,HPF,POC None None RBC/hpf   Bacteria Many (A) None, Too numerous to count   Mucus Present (A) Absent   Epithelial Cells, UR Per Microscopy Many (A) None, Too numerous to count cells/hpf    No results found.  ASSESSMENT AND PLAN  Laurie Hodge was seen today for follow-up.  Diagnoses and all  orders for this visit:  Frequent urination: She began improving after starting septra 4 days ago.  Advised she complete the course and follow up here as needed. The culture is likely a false negative and her infectious organism is resistant to cipro. Will hold on culture as it will likely be negative given abx therapy. Will consider a urologic consult if she does not improve.  -     POCT Microscopic Urinalysis ([facility]) -     POCT urinalysis dipstick    The patient was advised to call or return to clinic if she does not see an improvement in symptoms or to seek the care of the closest emergency department if she worsens with the above plan.   Philis Fendt, MHS, PA-C [facility] 10/19/2015 6:44 PM

## 2015-10-19 NOTE — Patient Instructions (Signed)
     IF you received an x-ray today, you will receive an invoice from [facility]. Please contact [facility] at [phone] with questions or concerns regarding your invoice.   IF you received labwork today, you will receive an invoice from [facility]. Please contact [facility] at [phone] with questions or concerns regarding your invoice.   Our billing staff will not be able to assist you with questions regarding bills from these companies.  You will be contacted with the lab results as soon as they are available. The fastest way to get your results is to activate your My Chart account. Instructions are located on the last page of this paperwork. If you have not heard from us regarding the results in 2 weeks, please contact this office.      

## 2015-10-26 ENCOUNTER — Encounter: Payer: Self-pay | Admitting: Neurology

## 2015-10-27 ENCOUNTER — Telehealth: Payer: Self-pay

## 2015-10-27 NOTE — Telephone Encounter (Signed)
Catron it looks like we have some documentation. Please advise.

## 2015-10-27 NOTE — Telephone Encounter (Signed)
Chelle - Pt's daughter, York Cerise, dropped off forms to be filled out for her weight loss program.  I have left the forms at the nurses desk.  Please call the daughter when ready to pick up at [phone]

## 2015-10-27 NOTE — Telephone Encounter (Signed)
Do we need to have history of attempting weight loss on her own? Cheele would you be able to fill this out, seems like you are her PCP

## 2015-10-29 NOTE — Telephone Encounter (Signed)
Completed form

## 2015-10-30 NOTE — Telephone Encounter (Signed)
Daughter notified 

## 2015-11-10 ENCOUNTER — Institutional Professional Consult (permissible substitution): Payer: BC Managed Care – PPO | Admitting: Neurology

## 2015-11-30 ENCOUNTER — Institutional Professional Consult (permissible substitution): Payer: BC Managed Care – PPO | Admitting: Neurology

## 2015-12-15 ENCOUNTER — Encounter: Payer: Self-pay | Admitting: Dietician

## 2015-12-15 ENCOUNTER — Encounter: Payer: BC Managed Care – PPO | Attending: Physician Assistant | Admitting: Dietician

## 2015-12-15 DIAGNOSIS — Z029 Encounter for administrative examinations, unspecified: Secondary | ICD-10-CM | POA: Diagnosis not present

## 2015-12-15 NOTE — Progress Notes (Signed)
  Follow-up visit: 6 Months Post-Operative sleeve gastrectomy Surgery  Medical Nutrition Therapy:  Appt start time: 405 end time:  430  Primary concerns today: Post-operative Bariatric Surgery Nutrition Management. Laurie Hodge returns today having lost another 20.9 lbs. Putting her mom in a nursing home this Saturday and stress eating. Still having some ice cream. Started doing BELT and enjoying it.   Finding it hard to get fluid it and figuring out if she is hungry or thirsty.   Surgery date: 05/31/15 Surgery type: Sleeve gastrectomy Start weight at [facility]: 277.5 lbs on 04/08/2015 Weight today: 194.6 lbs  Weight change: 20.9 lbs Total weight lost: 82.9 lbs Weight loss goal: 180 lbs  TANITA  BODY COMP RESULTS  05/18/15 06/15/15 07/28/15 09/08/15 12/15/15   BMI (kg/m^2) 44.8 40.5 36.2 34.8 31.4   Fat Mass (lbs) 143.5 133.5 104 95.0 72.0   Fat Free Mass (lbs) 134 117.5 120 120.5 122.6   Total Body Water (lbs) 98 86 88 88.0 87.4    Preferred Learning Style:   No preference indicated   Learning Readiness:   Ready  24-hr recall: B (AM): Premier protein or yogurt (15-30g) Snk (AM): cheese and [place] snack (15 g) L (PM): 2-3 oz protein hamburger patty OR [place] hot dog with beans (14-21g) Snk (PM): nuts or cottage cheese and fruit (6-12 g) D (PM):  Chicken/steak/ [place] patty or meat/shrimp with vegetables and sometimes rice (~30g) Snk (PM): none or sweets/sometimes ice cream  Fluid intake:  16 oz water/ice, protein shake (30 oz), lemonade or fruit punch if she goes out Estimated total protein intake: 66-81 grams per day   Medications: see list (has follow up appointment with PCP soon) Supplementation: taking, sometimes forgets to take Calcium, taking iron also  Using straws: no Drinking while eating: a little Hair loss: getting better Carbonated beverages: no N/V/D/C: cough sometimes make her feel like she is going to throw up when she eats or drinks; constipation is better Dumping  syndrome: none  Recent physical activity:  BELT 3 x week, lots of house work  Progress Towards Goal(s):  In progress.  Handouts given during visit include:  none   Nutritional Diagnosis:  [place]-3.3 Overweight/obesity related to past poor dietary habits and physical inactivity as evidenced by patient w/ recent sleeve gastrectomy surgery following dietary guidelines for continued weight loss.     Intervention:  Nutrition counseling provided. Goals:  Follow Phase 3B: High Protein + Non-Starchy Vegetables  Eat 3-6 small meals/snacks, every 3-5 hrs  Increase lean protein foods to meet 60g goal  Increase fluid intake to 64oz +  Avoid drinking 15 minutes before, during and 30 minutes after eating  Aim for >30 min of physical activity daily   Continue to not have ice cream around the house after this weekend   Set reminders in phone for calcium and drinking water (sips)  Teaching Method Utilized:  Visual Auditory Hands on  Barriers to learning/adherence to lifestyle change: none  Demonstrated degree of understanding via:  Teach Back   Monitoring/Evaluation:  Dietary intake, exercise, and body weight. Follow up in 3 months  for 9 month post-op visit.

## 2015-12-15 NOTE — Patient Instructions (Addendum)
Goals:  Follow Phase 3B: High Protein + Non-Starchy Vegetables  Eat 3-6 small meals/snacks, every 3-5 hrs  Increase lean protein foods to meet 60g goal  Increase fluid intake to 64oz +  Avoid drinking 15 minutes before, during and 30 minutes after eating  Aim for >30 min of physical activity daily   Continue to not have ice cream around the house after this weekend   Set reminders in phone for calcium and drinking water (sips)   Surgery date: 12/12/16Surgery type: Sleeve gastrectomy Start weight at [facility]: 277.5 lbs on 04/08/2015 Weight today: 194.6 lbs  Weight change: 20.9 lbs Total weight lost: 82.9 lbs Weight loss goal: 180 lbs  TANITA  BODY COMP RESULTS  05/18/15 06/15/15 07/28/15 09/08/15 12/15/15   BMI (kg/m^2) 44.8 40.5 36.2 34.8 31.4   Fat Mass (lbs) 143.5 133.5 104 95.0 72.0   Fat Free Mass (lbs) 134 117.5 120 120.5 122.6   Total Body Water (lbs) 98 86 88 88.0 87.4

## 2015-12-21 ENCOUNTER — Encounter: Payer: Self-pay | Admitting: Physician Assistant

## 2016-01-03 ENCOUNTER — Ambulatory Visit (INDEPENDENT_AMBULATORY_CARE_PROVIDER_SITE_OTHER): Payer: BC Managed Care – PPO | Admitting: Neurology

## 2016-01-03 ENCOUNTER — Encounter: Payer: Self-pay | Admitting: Neurology

## 2016-01-03 VITALS — BP 148/82 | HR 80 | Resp 16 | Ht 66.0 in | Wt 196.0 lb

## 2016-01-03 DIAGNOSIS — G4733 Obstructive sleep apnea (adult) (pediatric): Secondary | ICD-10-CM

## 2016-01-03 DIAGNOSIS — Z9989 Dependence on other enabling machines and devices: Principal | ICD-10-CM

## 2016-01-03 DIAGNOSIS — R634 Abnormal weight loss: Secondary | ICD-10-CM

## 2016-01-03 DIAGNOSIS — G4710 Hypersomnia, unspecified: Secondary | ICD-10-CM

## 2016-01-03 DIAGNOSIS — Z9884 Bariatric surgery status: Secondary | ICD-10-CM | POA: Diagnosis not present

## 2016-01-03 NOTE — Patient Instructions (Signed)
We will get you back in for a retest on your sleep apnea. We will do another sleep study as things may have changed since your significant weight loss.

## 2016-01-03 NOTE — Progress Notes (Signed)
Subjective:    Patient ID: Laurie Hodge is a 55 y.o. female.  HPI     Interim history:   Laurie Hodge is a 55 year old right-handed woman with an underlying medical history of rheumatoid arthritis, obesity, reflux disease, hypertension, vitamin D deficiency as well as iron deficiency anemia and history of HSV-2 infection, who presents for followup consultation of her sleep apnea, treated with CPAP therapy. She is unaccompanied today and presents after a long gap. Of note, she had interim weight loss surgery and has lost a significant amount of weight. She canceled multiple appointments in the interim, including 04/15/2015, 10/14/2015, 11/30/2015. I last saw her on 04/14/2014, at which time she reported doing well overall. She needed new supplies. She had switched from methotrexate to Humira for her rheumatoid arthritis.   Today, 01/03/2016: I reviewed her CPAP compliance data from 12/04/2015 through 01/02/2016 which is a total of 30 days during which time she used her machine only 13 days. It looks like she restarted treatment on 12/19/2015. Average usage for all days only 2 hours and 50 minutes, average usage for days on machine was 6 hours and 33 minutes. Residual AHI 2.2 per hour, leak at times high with the 95th percentile at 38.5 L/m on a pressure of 11 cm with EPR of 1.  Today, 01/03/2016: she reports that she had stopped using her CPAP because she felt she did not need it any longer. However, recently, her daughter reported to her that she was still snoring and that she was not breathing normally during sleep. She restarted using her CPAP for that reason. In the interim, she also had problems with her machine and the power cord had to be replaced. Her DME company is no longer in business. She does note the air leaking from the mask. She is using a nose mask. She had laparoscopic gastric sleeve resection on 05/31/15. She has been exercising. She has lost over 80 pounds since her weight loss  surgery. Her split-night sleep study from February 2015 had shown severe obstructive sleep apnea and she had been compliant with CPAP of 11 cm with good results. We reviewed her sleep study results from 07/29/2013 briefly again today. Total AHI was 45.2 per hour, oxyhemoglobin desaturation nadir was 82%, CPAP pressure of 11 cm via medium nasal mask reduced her AHI to 0 per hour, oxyhemoglobin desaturation nadir was 89%, she had mild PLMS with no arousals before and after CPAP treatment.  Previously:  I saw her on 10/13/2013, at which time she reported sleeping much better, EDS was much better, but she still had some fatigue and lately more SOBOE with some leg swelling noted. She had been placed on low dose HCTZ and pravachol and was going to see cardiology. She had an echocardiogram on 10/22/2013 which was reported as normal.  I reviewed her compliance data from 03/15/2014 through 04/13/2014 which is a total of 30 days during which time she used her machine every night except for 1 night. Percent used days greater than 4 hours was 90%, indicating excellent compliance with an average usage of 7 hours. Pressure at 11 cm with EPR 1. Residual AHI at 2.3 per hour indicating an adequate pressure setting. Leak was high at times. 95th percentile of leak was 39.8 L/m which is too high.  I first met her on 07/14/2013, at which time she reported that her CPAP machine had broken. She original sleep study was from 2009. I asked her to return for reevaluation and treatment.  She had a split-night sleep study on 07/29/2013. Her baseline sleep efficiency was 63.1% with a latency to sleep of 60 minutes and wake after sleep onset of 18.5 minutes with mild sleep fragmentation noted. She had a normal arousal index. She had an increased percentage of stage I sleep, an increased percentage of slow-wave sleep and a reduced percentage of REM sleep with a prolonged REM latency. She had mild periodic leg movements of sleep with no  arousals associated. She had mild to moderate snoring. Her total AHI was high at 45.2 per hour. Baseline oxygen saturation was 94% with a nadir of 82%. She was then titrated on CPAP, pressure of 5-11 cm. On the final pressure her AHI was 0 per hour. Supine REM sleep was achieved. She slept 92.2% of the time during the second part of the study. She had a increased percentage of stage II sleep, near absence of slow-wave sleep, and a normal percentage of REM sleep. Average oxygen saturation was 95% with a nadir of 89%. Snoring was eliminated. Based on the test results I prescribed CPAP for her.   I reviewed her compliance data from 08/25/2013 through 09/15/2013 which is a total of 22 days during which time she uses CPAP every night. Average usage for all days was 7 hours and 21 minutes, percent used days greater than 4 hours was 100%, indicating superb compliance. Residual AHI was acceptable at 1.9 per hour and leak was acceptable at 17.4 L per minute at the 95th percentile.   I reviewed her compliance data from 09/02/2013 through 10/12/2013 which is the last 41 days during which time she was CPAP every night. Percent used days greater than 4 hours was 98%, showing excellent compliance. Average usage was 7 hours and 56 minutes, residual AHI was 1.8 per hour, 95th percentile of leak was 26.4 L per minute, slightly high.   I reviewed her sleep study interpretation report which was performed at the [facility] on 09/17/2007: Her sleep efficiency was 68.6%, REM latency was 247 minutes. Her overall AHI was 7.23 per hour, rising to 28.09 per hour and REM sleep. Her lowest oxygen saturation was 92%. She was found to snore loudly. She was 255 lb at the time of her previous sleep study.   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 She does not smoke and drinks alcohol rarely.   She drinks an energy drink as needed, about once a week and is not much of a caffeine drinker.     Her Past Medical History Is Significant For: Past Medical History  Diagnosis Date  . Obesity, Class III, BMI 40-49.9 (morbid obesity) ([place])   . GERD (gastroesophageal reflux disease)   . HTN (hypertension)   . HSV-2 infection     IgG  . Vitamin D insufficiency   . Hemorrhoid   . Iron (Fe) deficiency anemia   . Hyperlipidemia   . Allergy   . Depression   . Ulcer     gastric - h. pylori positive  . Arthritis     RA  . OSA on CPAP     cpap broke this week  . OSA on CPAP     Her Past Surgical History Is Significant For: Past Surgical History  Procedure Laterality Date  . Tubal ligation    . Laparoscopic gastric sleeve resection N/A 05/31/2015    Procedure: LAPAROSCOPIC GASTRIC SLEEVE RESECTION;  Surgeon: Arta Bruce Kinsinger, MD;  Location: [facility];  Service:  General;  Laterality: N/A;    Her Family History Is Significant For: Family History  Problem Relation Age of Onset  . Diabetes Mother   . Hypertension Mother   . Dementia Mother   . Stroke Mother 27  . Hyperlipidemia Mother   . Cancer Paternal Aunt     breast ca x 5 maternal aunts  . Diabetes Sister   . Heart disease Sister   . Hyperlipidemia Sister   . Hypertension Sister   . Colon cancer Neg Hx   . Esophageal cancer Neg Hx   . Rectal cancer Neg Hx   . Stomach cancer Neg Hx   . Heart disease Father   . Hyperlipidemia Father   . Hypertension Father   . Stroke Father   . Heart disease Sister   . Hyperlipidemia Sister   . Hypertension Sister   . Diabetes Brother   . Hyperlipidemia Brother   . Hypertension Brother   . Hypertension Son     Her Social History Is Significant For: Social History   Social History  . Marital Status: Divorced    Spouse Name: n/a  . Number of Children: 3  . Years of Education: 12   Occupational History  . [employer]  .  [place] History Main Topics  . Smoking status: Never Smoker   . Smokeless tobacco:  Never Used  . Alcohol Use: No  . Drug Use: No  . Sexual Activity:    Partners: Male     Comment: not active since divorce from husband 2010   Other Topics Concern  . None   Social History Narrative   Lives with daughter. Her mother who has dementia and had a stroke lives with her during the week and her siblings take turns staying with their mother on the weekends.      Denies caffeine use     Her Allergies Are:  Allergies  Allergen Reactions  . Adhesive [Tape] Rash    Skin Peels  :   Her Current Medications Are:  Outpatient Encounter Prescriptions as of 01/03/2016  Medication Sig  . acetaminophen (TYLENOL) 500 MG tablet Take 1,000 mg by mouth as needed (pain).   . Adalimumab (HUMIRA PEN) 40 MG/0.8ML PNKT Inject 40 mg into the skin every 14 (fourteen) days.  . Biotin 5 MG CAPS Take by mouth.  . calcium-vitamin D (OSCAL WITH D) 500-200 MG-UNIT tablet Take 1 tablet by mouth 3 (three) times daily.  . cetirizine (ZYRTEC) 10 MG tablet Take 10 mg by mouth daily as needed for allergies.   . cholecalciferol (VITAMIN D) 1000 UNITS tablet Take 2,000 Units by mouth daily.   . Cyanocobalamin (VITAMIN B-12) 2500 MCG SUBL Place 2,500 mcg under the tongue daily.  Marland Kitchen docusate sodium (COLACE) 100 MG capsule Take 100 mg by mouth as needed.   . Fish Oil-Krill Oil (KRILL OIL PLUS) CAPS Take 750 mg by mouth daily.  . hydrochlorothiazide (MICROZIDE) 12.5 MG capsule Take 1 capsule (12.5 mg total) by mouth daily. (Patient taking differently: Take 12.5 mg by mouth as needed. )  . iron polysaccharides (FERREX 150) 150 MG capsule TAKE ONE CAPSULE BY MOUTH TWICE DAILY (Patient taking differently: Take 150 mg by mouth 2 (two) times daily. )  . lisinopril (PRINIVIL,ZESTRIL) 20 MG tablet Take 1 tablet (20 mg total) by mouth 2 (two) times daily.  . Multiple Vitamin (MULTIVITAMIN WITH MINERALS) TABS tablet Take 1 tablet by mouth 2 (two) times daily.  Marland Kitchen  omeprazole (PRILOSEC) 40 MG capsule TAKE ONE CAPSULE BY MOUTH  ONCE DAILY  . senna (SENOKOT) 8.6 MG TABS tablet Take 2 tablets by mouth as needed for mild constipation. Reported on 10/12/2015  . [DISCONTINUED] benzonatate (TESSALON) 100 MG capsule Take 1-2 capsules (100-200 mg total) by mouth 3 (three) times daily as needed for cough. (Patient not taking: Reported on 10/12/2015)  . [DISCONTINUED] ciprofloxacin (CIPRO) 500 MG tablet Take 1 tablet (500 mg total) by mouth 2 (two) times daily.  . [DISCONTINUED] fluconazole (DIFLUCAN) 150 MG tablet Take 1 tablet (150 mg total) by mouth once. Repeat if needed  . [DISCONTINUED] ipratropium (ATROVENT) 0.03 % nasal spray Place 2 sprays into both nostrils 2 (two) times daily. (Patient not taking: Reported on 10/12/2015)  . [DISCONTINUED] sulfamethoxazole-trimethoprim (BACTRIM DS,SEPTRA DS) 800-160 MG tablet Take 1 tablet by mouth 2 (two) times daily.   No facility-administered encounter medications on file as of 01/03/2016.  :  Review of Systems:  Out of a complete 14 point review of systems, all are reviewed and negative with the exception of these symptoms as listed below:   Review of Systems  Neurological:       Patient is currently using CPAP. She needs prescription for new supplies.  Patient has used [facility] (who is now only in [place], [place])  Epworth Sleepiness Scale 0= would never doze 1= slight chance of dozing 2= moderate chance of dozing 3= high chance of dozing  Sitting and reading:1 Watching TV:2 Sitting inactive in a public place (ex. Theater or meeting):1 As a passenger in a car for an hour without a break:3 Lying down to rest in the afternoon:3 Sitting and talking to someone:0 Sitting quietly after lunch (no alcohol):0 In a car, while stopped in traffic:2 Total:12   Objective:  Neurologic Exam  Physical Exam Physical Examination:   Filed Vitals:   01/03/16 0847  BP: 148/82  Pulse: 80  Resp: 16     General Examination: The patient is a very pleasant 55 y.o. female in no acute  distress. She appears well-developed and well-nourished and well groomed. She has lost quite a bit of weight.   HEENT: Normocephalic, atraumatic, pupils are equal, round and reactive to light and accommodation. Funduscopic exam is normal with sharp disc margins noted. Extraocular tracking is good without limitation to gaze excursion or nystagmus noted. Normal smooth pursuit is noted. Hearing is grossly intact. Face is symmetric with normal facial animation and normal facial sensation. Speech is clear with no dysarthria noted. There is no hypophonia. There is no lip, neck/head, jaw or voice tremor. Neck is supple with full range of passive and active motion. There are no carotid bruits on auscultation. Oropharynx exam reveals: mild to moderate mouth dryness, adequate dental hygiene and moderate airway crowding, due to large tongue and smaller airway entry. Mallampati is class II. Tongue protrudes centrally and palate elevates symmetrically. Tonsils are 1+ in size.    Chest: Clear to auscultation without wheezing, rhonchi or crackles noted.  Heart: S1+S2+0, regular and normal without murmurs, rubs or gallops noted.   Abdomen: Soft, non-tender and non-distended with normal bowel sounds appreciated on auscultation.  Extremities: There is no edema in the distal lower extremities bilaterally. Pedal pulses are intact.  Skin: Warm and dry without trophic changes noted. There are no varicose veins.  Musculoskeletal: exam reveals no obvious joint deformities, but hips and knees are sore.   Neurologically:  Mental status: The patient is awake, alert and oriented in all 4 spheres. Her  memory, attention, language and knowledge are appropriate. There is no aphasia, agnosia, apraxia or anomia. Speech is clear with normal prosody and enunciation. Thought process is linear. Mood is congruent and affect is normal.  Cranial nerves are as described above under HEENT exam. In addition, shoulder shrug is normal with  equal shoulder height noted. Motor exam: Normal bulk, strength and tone is noted. There is no drift, tremor or rebound. Romberg is negative. Reflexes are 1-2+ throughout. Fine motor skills are intact with normal finger taps, normal hand movements, normal rapid alternating patting, normal foot taps and normal foot agility.  Cerebellar testing shows no dysmetria or intention tremor on finger to nose testing. Heel to shin is difficult for her. There is no truncal or gait ataxia.  Sensory exam is intact to light touch, pinprick, vibration, temperature sense in the upper and lower extremities.  Gait, station and balance are unremarkable. No veering to one side is noted. No leaning to one side is noted. Posture is age-appropriate and stance is narrow based. No problems turning are noted. Tandem walk is unremarkable.   Assessment and Plan:   In summary, CALDONIA LEAP is a very pleasant 55 year old female with an underlying medical history of rheumatoid arthritis, obesity, reflux disease, hypertension, vitamin D deficiency as well as iron deficiency anemia, Hx of HSV-2 infection, and s/p bariatric surgery with overall 80 lb weight loss since her peak weight, who presents for follow-up consultation of her obstructive sleep apnea. She was diagnosed with severe OSA in February 2015, prior to that she was diagnosed with OSA in 2009. She has been on CPAP therapy with initially full compliance but after her weight loss she stopped using CPAP therapy. She recently restarted it because her daughter noticed residual sleep disordered breathing. In addition, the patient reports some daytime somnolence and her Epworth sleepiness score today is 12 out of 24. She is commended for trying to be compliant with CPAP therapy. She restarted using it earlier this month but needs reevaluation given her significant weight loss and suboptimal results subjectively. In addition, she is advised to be better hydrated with water and keep a  good sleep awake schedule. She admits that she is not great with her sleep schedule currently. Her mask is also leaking. She will need a mask refit as well. To that end, I suggested we bring her back for an overnight sleep study for re-evaluation and optimization of treatment. She is in agreement. I will see her back after her sleep study. I answered all her questions today and she was in agreement.  I spent 25 minutes in total face-to-face time with the patient, more than 50% of which was spent in counseling and coordination of care, reviewing test results, reviewing medication and discussing or reviewing the diagnosis of OSA, its prognosis and treatment options.

## 2016-01-16 ENCOUNTER — Ambulatory Visit (INDEPENDENT_AMBULATORY_CARE_PROVIDER_SITE_OTHER): Payer: BC Managed Care – PPO | Admitting: Neurology

## 2016-01-16 DIAGNOSIS — G479 Sleep disorder, unspecified: Secondary | ICD-10-CM

## 2016-01-16 DIAGNOSIS — G4761 Periodic limb movement disorder: Secondary | ICD-10-CM

## 2016-01-16 DIAGNOSIS — G4720 Circadian rhythm sleep disorder, unspecified type: Secondary | ICD-10-CM

## 2016-01-16 DIAGNOSIS — G4733 Obstructive sleep apnea (adult) (pediatric): Secondary | ICD-10-CM | POA: Diagnosis not present

## 2016-01-19 ENCOUNTER — Telehealth: Payer: Self-pay | Admitting: Neurology

## 2016-01-19 NOTE — Telephone Encounter (Signed)
Patient referred by Harrison Mons, seen by me on 01/03/16, diagnostic PSG on 01/16/16.   Please call and notify the patient that the recent sleep study did mild residual OSA since her wt loss after bariatric surgery. CPAP is not warranted but an option. She had severe PLMs with some arousals; I would like to go over the details of the study and Rx options during a follow up appointment. Arrange a followup appointment. Also, route or fax report to PCP and referring MD, if other than PCP.  Once you have spoken to patient, you can close this encounter.   Thanks,  Star Age, MD, PhD [facility])

## 2016-01-19 NOTE — Telephone Encounter (Signed)
I spoke to patient and she is aware of results and recommendations. We were able to make f/u appt. I will send results to PCP.

## 2016-01-24 ENCOUNTER — Other Ambulatory Visit: Payer: Self-pay | Admitting: Physician Assistant

## 2016-01-24 DIAGNOSIS — I10 Essential (primary) hypertension: Secondary | ICD-10-CM

## 2016-01-25 ENCOUNTER — Encounter: Payer: Self-pay | Admitting: Physician Assistant

## 2016-02-04 ENCOUNTER — Other Ambulatory Visit: Payer: Self-pay | Admitting: Physician Assistant

## 2016-02-17 ENCOUNTER — Ambulatory Visit: Payer: Self-pay | Admitting: Neurology

## 2016-02-29 ENCOUNTER — Encounter: Payer: Self-pay | Admitting: Physician Assistant

## 2016-02-29 ENCOUNTER — Ambulatory Visit (INDEPENDENT_AMBULATORY_CARE_PROVIDER_SITE_OTHER): Payer: BC Managed Care – PPO | Admitting: Physician Assistant

## 2016-02-29 VITALS — BP 122/70 | HR 62 | Temp 98.4°F | Resp 18 | Ht 66.0 in | Wt 200.0 lb

## 2016-02-29 DIAGNOSIS — E559 Vitamin D deficiency, unspecified: Secondary | ICD-10-CM | POA: Diagnosis not present

## 2016-02-29 DIAGNOSIS — Z0000 Encounter for general adult medical examination without abnormal findings: Secondary | ICD-10-CM

## 2016-02-29 DIAGNOSIS — D509 Iron deficiency anemia, unspecified: Secondary | ICD-10-CM

## 2016-02-29 DIAGNOSIS — M069 Rheumatoid arthritis, unspecified: Secondary | ICD-10-CM

## 2016-02-29 DIAGNOSIS — E785 Hyperlipidemia, unspecified: Secondary | ICD-10-CM

## 2016-02-29 DIAGNOSIS — G4733 Obstructive sleep apnea (adult) (pediatric): Secondary | ICD-10-CM

## 2016-02-29 DIAGNOSIS — I10 Essential (primary) hypertension: Secondary | ICD-10-CM

## 2016-02-29 LAB — POCT URINALYSIS DIP (MANUAL ENTRY)
Bilirubin, UA: NEGATIVE
Glucose, UA: NEGATIVE
Ketones, POC UA: NEGATIVE
Leukocytes, UA: NEGATIVE
Nitrite, UA: NEGATIVE
PH UA: 5
SPEC GRAV UA: 1.025
UROBILINOGEN UA: 0.2

## 2016-02-29 LAB — POC MICROSCOPIC URINALYSIS (UMFC): MUCUS RE: ABSENT

## 2016-02-29 NOTE — Progress Notes (Signed)
Patient ID: Laurie Hodge, female    DOB: Feb 21, 1961, 55 y.o.   MRN: [id number]  PCP: Harrison Mons, PA-C  Chief Complaint  Patient presents with  . Annual Exam    no pap    Subjective:   HPI: Presents for [employer].   Cervical Cancer Screening: last pap 2013, here, ?before EMR? Will pull paper record for date and HPV status. Breast Cancer Screening: missed her mammogram appointment this year, but plans to reschedule Colorectal Cancer Screening: 2014, repeat 2024 Bone Density Testing: not yet HIV Screening: complete STI Screening: not currently sexually active Seasonal Influenza Vaccination: declines Td/Tdap Vaccination: 2008 Pneumococcal Vaccination: not yet a candidate Zoster Vaccination: not yet Frequency of Dental evaluation: doesn't see a dentist regularly Frequency of Eye evaluation: annually  Due to weight loss, she is sleeping better and with neurology has been able to d/c CPAP.    Patient Active Problem List   Diagnosis Date Noted  . Osteoarthritis 02/05/2015  . Hyperlipidemia 11/06/2013  . Rheumatoid arthritis ([place]) 06/10/2013  . BMI 32.0-32.9,adult   . GERD (gastroesophageal reflux disease)   . OSA (obstructive sleep apnea)     Class: Minor  . HTN (hypertension)   . HSV-2 infection   . Vitamin D insufficiency   . Iron (Fe) deficiency anemia     Past Medical History:  Diagnosis Date  . Allergy   . Arthritis    RA  . Depression   . GERD (gastroesophageal reflux disease)   . Hemorrhoid   . HSV-2 infection    IgG  . HTN (hypertension)   . Hyperlipidemia   . Iron (Fe) deficiency anemia   . Obesity, Class III, BMI 40-49.9 (morbid obesity) ([place])   . OSA on CPAP    cpap broke this week  . OSA on CPAP   . Ulcer    gastric - h. pylori positive  . Vitamin D insufficiency      Prior to Admission medications   Medication Sig Start Date End Date Taking? Authorizing Provider  acetaminophen (TYLENOL) 500 MG tablet Take 1,000 mg by  mouth as needed (pain).    Yes Historical Provider, MD  Adalimumab (HUMIRA PEN) 40 MG/0.8ML PNKT Inject 40 mg into the skin every 14 (fourteen) days. 07/06/15  Yes Cordelro Gautreau, PA-C  Biotin 5 MG CAPS Take by mouth.   Yes Historical Provider, MD  calcium-vitamin D (OSCAL WITH D) 500-200 MG-UNIT tablet Take 1 tablet by mouth 3 (three) times daily.   Yes Historical Provider, MD  cetirizine (ZYRTEC) 10 MG tablet Take 10 mg by mouth daily as needed for allergies.    Yes Historical Provider, MD  cholecalciferol (VITAMIN D) 1000 UNITS tablet Take 2,000 Units by mouth daily.    Yes Historical Provider, MD  Cyanocobalamin (VITAMIN B-12) 2500 MCG SUBL Place 2,500 mcg under the tongue daily.   Yes Historical Provider, MD  Fish Oil-Krill Oil (KRILL OIL PLUS) CAPS Take 750 mg by mouth daily.   Yes Historical Provider, MD  iron polysaccharides (FERREX 150) 150 MG capsule TAKE ONE CAPSULE BY MOUTH TWICE DAILY Patient taking differently: Take 150 mg by mouth 2 (two) times daily.  12/29/14  Yes Sashay Felling, PA-C  lisinopril (PRINIVIL,ZESTRIL) 20 MG tablet TAKE ONE TABLET BY MOUTH TWICE DAILY 01/25/16  Yes Abyan Cadman, PA-C  Multiple Vitamin (MULTIVITAMIN WITH MINERALS) TABS tablet Take 1 tablet by mouth 2 (two) times daily.   Yes Historical Provider, MD  omeprazole (PRILOSEC) 40 MG capsule TAKE ONE  CAPSULE BY MOUTH ONCE DAILY 02/08/16  Yes Orbin Mayeux, PA-C  docusate sodium (COLACE) 100 MG capsule Take 100 mg by mouth as needed.     Historical Provider, MD  hydrochlorothiazide (MICROZIDE) 12.5 MG capsule Take 1 capsule (12.5 mg total) by mouth daily. Patient not taking: Reported on 02/29/2016 12/29/14   Harrison Mons, PA-C  senna (SENOKOT) 8.6 MG TABS tablet Take 2 tablets by mouth as needed for mild constipation. Reported on 10/12/2015    Historical Provider, MD    Allergies  Allergen Reactions  . Adhesive [Tape] Rash    Skin Peels    Past Surgical History:  Procedure Laterality Date  . LAPAROSCOPIC  GASTRIC SLEEVE RESECTION N/A 05/31/2015   Procedure: LAPAROSCOPIC GASTRIC SLEEVE RESECTION;  Surgeon: Arta Bruce Kinsinger, MD;  Location: [facility];  Service: General;  Laterality: N/A;  . TUBAL LIGATION      Family History  Problem Relation Age of Onset  . Diabetes Mother   . Hypertension Mother   . Dementia Mother   . Stroke Mother 52  . Hyperlipidemia Mother   . Cancer Paternal Aunt     breast ca x 5 maternal aunts  . Diabetes Sister   . Heart disease Sister   . Hyperlipidemia Sister   . Hypertension Sister   . Heart disease Father   . Hyperlipidemia Father   . Hypertension Father   . Stroke Father   . Heart disease Sister   . Hyperlipidemia Sister   . Hypertension Sister   . Diabetes Brother   . Hyperlipidemia Brother   . Hypertension Brother   . Hypertension Son   . Colon cancer Neg Hx   . Esophageal cancer Neg Hx   . Rectal cancer Neg Hx   . Stomach cancer Neg Hx     Social History   Social History  . Marital status: Divorced    Spouse name: n/a  . Number of children: 3  . Years of education: 12   Occupational History  . [employer]  .  [place] History Main Topics  . Smoking status: Never Smoker  . Smokeless tobacco: Never Used  . Alcohol use No  . Drug use: No  . Sexual activity: Not Currently    Partners: Male     Comment: not active since divorce from husband 2010   Other Topics Concern  . None   Social History Narrative   Lives with daughter. Her mother who has dementia and had a stroke lives with her during the week and her siblings take turns staying with their mother on the weekends.      Denies caffeine use        Review of Systems Constitutional: Negative for activity change, appetite change, fatigue and fever.  HENT: Negative for ear pain and sore throat.   Eyes: Negative for visual disturbance.  Respiratory: Positive for apnea (mild, but improved from baseline ). Negative for  cough and chest tightness.   Cardiovascular: Negative for chest pain and palpitations.  Gastrointestinal: Negative for abdominal pain, diarrhea, nausea and vomiting.  Genitourinary: Negative for dysuria and frequency.  Musculoskeletal: Positive for arthralgias (occational).  Skin: Negative for rash.      Objective:  Physical Exam  Constitutional: She is oriented to person, place, and time. Vital signs are normal. She appears well-developed and well-nourished. She is active and cooperative. No distress.  BP 122/70 (BP Location: Right Arm, Patient Position: Sitting, Cuff Size: Small)  Pulse 62   Temp 98.4 F (36.9 C) (Oral)   Resp 18   Ht 5\' 6"  (1.676 m)   Wt 200 lb (90.7 kg)   LMP 02/29/2016   SpO2 100%   BMI 32.28 kg/m    HENT:  Head: Normocephalic and atraumatic.  Right Ear: Hearing, tympanic membrane, external ear and ear canal normal. No foreign bodies.  Left Ear: Hearing, tympanic membrane, external ear and ear canal normal. No foreign bodies.  Nose: Nose normal.  Mouth/Throat: Uvula is midline, oropharynx is clear and moist and mucous membranes are normal. No oral lesions. Normal dentition. No dental abscesses or uvula swelling. No oropharyngeal exudate.  Eyes: Conjunctivae, EOM and lids are normal. Pupils are equal, round, and reactive to light. Right eye exhibits no discharge. Left eye exhibits no discharge. No scleral icterus.  Fundoscopic exam:      The right eye shows no arteriolar narrowing, no AV nicking, no exudate, no hemorrhage and no papilledema. The right eye shows red reflex.       The left eye shows no arteriolar narrowing, no AV nicking, no exudate, no hemorrhage and no papilledema. The left eye shows red reflex.  Neck: Trachea normal, normal range of motion and full passive range of motion without pain. Neck supple. No spinous process tenderness and no muscular tenderness present. No thyroid mass and no thyromegaly present.  Cardiovascular: Normal rate, regular  rhythm, normal heart sounds, intact distal pulses and normal pulses.   Pulmonary/Chest: Effort normal and breath sounds normal. Right breast exhibits no inverted nipple, no mass, no nipple discharge, no skin change and no tenderness. Left breast exhibits no inverted nipple, no mass, no nipple discharge, no skin change and no tenderness. Breasts are symmetrical.  Musculoskeletal: She exhibits no edema or tenderness.       Cervical back: Normal.       Thoracic back: Normal.       Lumbar back: Normal.  Lymphadenopathy:       Head (right side): No tonsillar, no preauricular, no posterior auricular and no occipital adenopathy present.       Head (left side): No tonsillar, no preauricular, no posterior auricular and no occipital adenopathy present.    She has no cervical adenopathy.       Right: No supraclavicular adenopathy present.       Left: No supraclavicular adenopathy present.  Neurological: She is alert and oriented to person, place, and time. She has normal strength and normal reflexes. No cranial nerve deficit. She exhibits normal muscle tone. Coordination and gait normal.  Skin: Skin is warm, dry and intact. No rash noted. She is not diaphoretic. No cyanosis or erythema. Nails show no clubbing.  Psychiatric: She has a normal mood and affect. Her speech is normal and behavior is normal. Judgment and thought content normal.           Assessment & Plan:  1. Annual physical exam Age appropriate anticipatory guidance provided. Paper chart pulled and reviewed after the visit. Last cervical cancer screening was 2012, with normal cytology and negative HPV. She is due for co-testing this year, which can be performed at her leisure when she is not menstruating.  2. Essential hypertension Controlled. Stable. Continue current treatment. - POCT urinalysis dipstick - POCT Microscopic Urinalysis ([facility]) - CBC with Differential/Platelet  3. OSA (obstructive sleep apnea) Improved with weight  loss. No longer needs CPAP.  4. Hyperlipidemia Await labs. Adjust regimen as indicated by results. - Lipid panel - Comprehensive metabolic  panel  5. Rheumatoid arthritis involving both knees, unspecified rheumatoid factor presence ([place]) Continue follow-up with Rheumatology. CBC and CMET results will be forwarded, as the patient needs these monitored due to Humira therapy.  6. Vitamin D insufficiency Await labs. Adjust regimen as indicated by results. - VITAMIN D 25 Hydroxy (Vit-D Deficiency, Fractures)  7. Iron (Fe) deficiency anemia Asymptomatic. Continues to have heavy menstrual bleeds. Await lab results. - CBC with Differential/Platelet   Fara Chute, PA-C Physician Assistant-Certified [facility]

## 2016-02-29 NOTE — Progress Notes (Signed)
   Subjective:    Patient ID: Laurie Hodge, female    DOB: 1960/11/03, 55 y.o.   MRN: [id number] Chief Complaint  Patient presents with  . Annual Exam    no pap   HPI Patient is a 55 yo AA female with a hx of HTN, OSA, GERD, arthritis and hyperlipidemia who presents today for an annual physical exam. Patient reports recent weight loss and better sleep. With her improved symptoms patient has ceased using CPAP during the night time.No other complaints reported.   Review of Systems  Constitutional: Negative for activity change, appetite change, fatigue and fever.  HENT: Negative for ear pain and sore throat.   Eyes: Negative for visual disturbance.  Respiratory: Positive for apnea (mild, but improved from baseline ). Negative for cough and chest tightness.   Cardiovascular: Negative for chest pain and palpitations.  Gastrointestinal: Negative for abdominal pain, diarrhea, nausea and vomiting.  Genitourinary: Negative for dysuria and frequency.  Musculoskeletal: Positive for arthralgias (occational).  Skin: Negative for rash.       Objective:   Physical Exam  Constitutional: She appears well-developed and well-nourished. She appears distressed.  HENT:  Head: Normocephalic and atraumatic.  Right Ear: External ear normal.  Left Ear: External ear normal.  Nose: Nose normal.  Eyes: Conjunctivae are normal. Left eye exhibits no discharge.  Neck: Neck supple.  Cardiovascular: Normal rate, regular rhythm and intact distal pulses.  Exam reveals friction rub. Exam reveals no gallop.   No murmur heard. Pulmonary/Chest: Effort normal and breath sounds normal. No respiratory distress. She exhibits no tenderness.  Abdominal: Soft. Bowel sounds are normal. She exhibits no distension.  Musculoskeletal: Normal range of motion. She exhibits no tenderness.  Lymphadenopathy:    She has no cervical adenopathy.  Skin: Skin is warm and dry.  Psychiatric: She has a normal mood and affect.           Assessment & Plan:  1. Annual physical exam Age appropriate anticipatory guidance given. Patient due for cervical cancer screen. Last screen was done in 2012 with normal cytology and negative HPV. Patient may schedule cervical cancer screening at her leisure when she is not menstruating.   2. Essential hypertension Well controlled on medication - POCT urinalysis dipstick - POCT Microscopic Urinalysis ([facility]) - CBC with Differential/Platelet  3. OSA (obstructive sleep apnea) Recently went off CPAP due to recent weight loss. Continue therapeutic measures  4. Hyperlipidemia Currently controlled on medication, continue medical therapy  Will plan to taper off if continues to lose weight and cholesterol remains within normal range - Lipid panel - Comprehensive metabolic panel  5. Rheumatoid arthritis involving both knees, unspecified rheumatoid factor presence ([place]) Relatively well controlled on medication  6. Vitamin D insufficiency  - VITAMIN D 25 Hydroxy (Vit-D Deficiency, Fractures)  7. Iron (Fe) deficiency anemia  - CBC with Differential/Platelet

## 2016-02-29 NOTE — Patient Instructions (Addendum)
   IF you received an x-ray today, you will receive an invoice from [facility]. Please contact [facility] at [phone] with questions or concerns regarding your invoice.   IF you received labwork today, you will receive an invoice from [facility]. Please contact [facility] at [phone] with questions or concerns regarding your invoice.   Our billing staff will not be able to assist you with questions regarding bills from these companies.  You will be contacted with the lab results as soon as they are available. The fastest way to get your results is to activate your My Chart account. Instructions are located on the last page of this paperwork. If you have not heard from us regarding the results in 2 weeks, please contact this office.    Keeping You Healthy  Get These Tests  Blood Pressure- Have your blood pressure checked by your healthcare provider at least once a year.  Normal blood pressure is 120/80.  Weight- Have your body mass index (BMI) calculated to screen for obesity.  BMI is a measure of body fat based on height and weight.  You can calculate your own BMI at [email or web address].com/bmi/  Cholesterol- Have your cholesterol checked every year.  Diabetes- Have your blood sugar checked every year if you have high blood pressure, high cholesterol, a family history of diabetes or if you are overweight.  Pap Test - Have a pap test every 1 to 5 years if you have been sexually active.  If you are older than 65 and recent pap tests have been normal you may not need additional pap tests.  In addition, if you have had a hysterectomy  for benign disease additional pap tests are not necessary.  Mammogram-Yearly mammograms are essential for early detection of breast cancer  Screening for Colon Cancer- Colonoscopy starting at age 50. Screening may begin sooner depending on your family history and other health conditions.  Follow up colonoscopy  as directed by your Gastroenterologist.  Screening for Osteoporosis- Screening begins at age 65 with bone density scanning, sooner if you are at higher risk for developing Osteoporosis.  Get these medicines  Calcium with Vitamin D- Your body requires 1200-1500 mg of Calcium a day and 800-1000 IU of Vitamin D a day.  You can only absorb 500 mg of Calcium at a time therefore Calcium must be taken in 2 or 3 separate doses throughout the day.  Hormones- Hormone therapy has been associated with increased risk for certain cancers and heart disease.  Talk to your healthcare provider about if you need relief from menopausal symptoms.  Aspirin- Ask your healthcare provider about taking Aspirin to prevent Heart Disease and Stroke.  Get these Immuniztions  Flu shot- Every fall  Pneumonia shot- Once after the age of 65; if you are younger ask your healthcare provider if you need a pneumonia shot.  Tetanus- Every ten years.  Zostavax- Once after the age of 60 to prevent shingles.  Take these steps  Don't smoke- Your healthcare provider can help you quit. For tips on how to quit, ask your healthcare provider or go to www.smokefree.gov or call 1-800 QUIT-NOW.  Be physically active- Exercise 5 days a week for a minimum of 30 minutes.  If you are not already physically active, start slow and gradually work up to 30 minutes of moderate physical activity.  Try walking, dancing, bike riding, swimming, etc.  Eat a healthy diet- Eat a variety of healthy foods such as fruits, vegetables, whole   grains, low fat milk, low fat cheeses, yogurt, lean meats, chicken, fish, eggs, dried beans, tofu, etc.  For more information go to www.thenutritionsource.org  Dental visit- Brush and floss teeth twice daily; visit your dentist twice a year.  Eye exam- Visit your Optometrist or Ophthalmologist yearly.  Drink alcohol in moderation- Limit alcohol intake to one drink or less a day.  Never drink and  drive.  Depression- Your emotional health is as important as your physical health.  If you're feeling down or losing interest in things you normally enjoy, please talk to your healthcare provider.  Seat Belts- can save your life; always wear one  Smoke/Carbon Monoxide detectors- These detectors need to be installed on the appropriate level of your home.  Replace batteries at least once a year.  Violence- If anyone is threatening or hurting you, please tell your healthcare provider.  Living Will/ Health care power of attorney- Discuss with your healthcare provider and family.  We recommend that you schedule a mammogram for breast cancer screening. Typically, you do not need a referral to do this. Please contact a local imaging center to schedule your mammogram. [place] - [phone]

## 2016-03-01 LAB — CBC WITH DIFFERENTIAL/PLATELET
Basophils Absolute: 44 cells/uL (ref 0–200)
Basophils Relative: 1 %
EOS ABS: 88 {cells}/uL (ref 15–500)
Eosinophils Relative: 2 %
HEMATOCRIT: 32.6 % — AB (ref 35.0–45.0)
HEMOGLOBIN: 9.5 g/dL — AB (ref 11.7–15.5)
LYMPHS ABS: 2112 {cells}/uL (ref 850–3900)
LYMPHS PCT: 48 %
MCH: 20.7 pg — ABNORMAL LOW (ref 27.0–33.0)
MCHC: 29.1 g/dL — AB (ref 32.0–36.0)
MCV: 71 fL — AB (ref 80.0–100.0)
MONO ABS: 484 {cells}/uL (ref 200–950)
MPV: 8.9 fL (ref 7.5–12.5)
Monocytes Relative: 11 %
NEUTROS PCT: 38 %
Neutro Abs: 1672 cells/uL (ref 1500–7800)
Platelets: 481 10*3/uL — ABNORMAL HIGH (ref 140–400)
RBC: 4.59 MIL/uL (ref 3.80–5.10)
RDW: 19.1 % — AB (ref 11.0–15.0)
WBC: 4.4 10*3/uL (ref 3.8–10.8)

## 2016-03-01 LAB — COMPREHENSIVE METABOLIC PANEL
ALT: 7 U/L (ref 6–29)
AST: 17 U/L (ref 10–35)
Albumin: 4 g/dL (ref 3.6–5.1)
Alkaline Phosphatase: 46 U/L (ref 33–130)
BUN: 13 mg/dL (ref 7–25)
CHLORIDE: 106 mmol/L (ref 98–110)
CO2: 26 mmol/L (ref 20–31)
CREATININE: 0.6 mg/dL (ref 0.50–1.05)
Calcium: 9.3 mg/dL (ref 8.6–10.4)
GLUCOSE: 77 mg/dL (ref 65–99)
POTASSIUM: 4.1 mmol/L (ref 3.5–5.3)
SODIUM: 140 mmol/L (ref 135–146)
Total Bilirubin: 0.6 mg/dL (ref 0.2–1.2)
Total Protein: 7.8 g/dL (ref 6.1–8.1)

## 2016-03-01 LAB — LIPID PANEL
CHOL/HDL RATIO: 2.7 ratio (ref <=5.0)
Cholesterol: 247 mg/dL — ABNORMAL HIGH (ref 125–200)
HDL: 92 mg/dL (ref >=46)
LDL CALC: 141 mg/dL — AB (ref <130)
Triglycerides: 70 mg/dL (ref <150)
VLDL: 14 mg/dL (ref <30)

## 2016-03-01 LAB — VITAMIN D 25 HYDROXY (VIT D DEFICIENCY, FRACTURES): Vit D, 25-Hydroxy: 42 ng/mL (ref 30–100)

## 2016-03-01 MED ORDER — ATORVASTATIN CALCIUM 20 MG PO TABS: 20.0000 mg | ORAL_TABLET | Freq: Every day | ORAL | 3 refills | Status: DC

## 2016-03-04 ENCOUNTER — Other Ambulatory Visit: Payer: Self-pay | Admitting: Physician Assistant

## 2016-03-04 DIAGNOSIS — D509 Iron deficiency anemia, unspecified: Secondary | ICD-10-CM

## 2016-03-15 ENCOUNTER — Encounter: Payer: BC Managed Care – PPO | Attending: Physician Assistant | Admitting: Dietician

## 2016-03-15 DIAGNOSIS — Z029 Encounter for administrative examinations, unspecified: Secondary | ICD-10-CM | POA: Diagnosis not present

## 2016-03-15 NOTE — Progress Notes (Signed)
Follow-up visit: 9 Months Post-Operative sleeve gastrectomy Surgery  Medical Nutrition Therapy:  Appt start time: 405 end time:  430  Primary concerns today: Post-operative Bariatric Surgery Nutrition Management. Essence returns today having gain 5 lbs weight gain. Was traveling a lot over the summer and is doing better now that she is back in school. No longer doing the BELT program.   Stated that her iron has been low recently.  Feeling better that her mother is in a nursing home but does feel some guilt.    Still struggles still to get in enough fluid.  Finding it hard to get fluid it and figuring out if she is hungry or thirsty.   Surgery date: 05/31/15 Surgery type: Sleeve gastrectomy Start weight at [facility]: 277.5 lbs on 04/08/2015 Weight today: 199.6 lbs  Weight change: 5 lbs weight gain Total weight lost: 77.9 lbs Weight loss goal: 180 lbs  TANITA  BODY COMP RESULTS  05/18/15 06/15/15 07/28/15 09/08/15 12/15/15 03/15/16   BMI (kg/m^2) 44.8 40.5 36.2 34.8 31.4 32.2   Fat Mass (lbs) 143.5 133.5 104 95.0 72.0 78.2   Fat Free Mass (lbs) 134 117.5 120 120.5 122.6 121.4   Total Body Water (lbs) 98 86 88 88.0 87.4 86.8    Preferred Learning Style:   No preference indicated   Learning Readiness:   Ready  24-hr recall: B (AM): Premier protein (30g) Snk (AM): cheese and crackers (7 g) L (PM): sandwich with  1 slice of lunch meat and 1 slice of cheese with fruit (10 g) Snk (PM):none D (PM):  4-5 oz Chicken/steak/ [place] patty or meat/shrimp with starch (28-35 g) Snk (PM): ice cream  Fluid intake:  16 oz water/ice, protein shake (11 oz)  Estimated total protein intake: 66-81 grams per day   Medications: see list (has follow up appointment with PCP soon) Supplementation: taking better now than in the summer, multivitamin and iron taking taking calcium 2 x per day Using straws: no Drinking while eating: a little Hair loss: getting better Carbonated beverages: no N/V/D/C:  cough sometimes make her feel like she is going to throw up when she eats or drinks; constipation has been an ongoing problem  Dumping syndrome: none  Recent physical activity:  Rode bike home from work  Progress Towards Goal(s):  In progress.  Handouts given during visit include:  none   Nutritional Diagnosis:  [place]-3.3 Overweight/obesity related to past poor dietary habits and physical inactivity as evidenced by patient w/ recent sleeve gastrectomy surgery following dietary guidelines for continued weight loss.     Intervention:  Nutrition counseling provided. Goals:  Follow Phase 3B: High Protein + Non-Starchy Vegetables  Eat 3-6 small meals/snacks, every 3-5 hrs  Increase lean protein foods to meet 60g goal  Increase fluid intake to 64oz +  Avoid drinking 15 minutes before, during and 30 minutes after eating  Aim for >30 min of physical activity daily   Try having a dannon light and fit yogurt at night instead of ice cream (try freezing)  Try focusing on drinking water before and after work  Replace starches (crackers, bread, potatoes, rice) with vegetables   Try citracal petites calcium (400-500 mg, 3 x day) For multivitamins (contains 3000 IU Vitamin D, 1000 mcg B12) try:   Bariatric Advantage Ultra Multi with Iron - 3 per day ($51.95 for a 90 day supply).    Celebrate Multi-Complete 36/45/60 with Iron - 3 per day  ($49.95 for 90 day supply) the 18 mg  version does not have enough thiamin   Teaching Method Utilized:  Visual Auditory Hands on  Barriers to learning/adherence to lifestyle change: none  Demonstrated degree of understanding via:  Teach Back   Monitoring/Evaluation:  Dietary intake, exercise, and body weight. Follow up in 2 months  for 11 month post-op visit.

## 2016-03-15 NOTE — Patient Instructions (Addendum)
Goals:  Follow Phase 3B: High Protein + Non-Starchy Vegetables  Eat 3-6 small meals/snacks, every 3-5 hrs  Increase lean protein foods to meet 60g goal  Increase fluid intake to 64oz +  Avoid drinking 15 minutes before, during and 30 minutes after eating  Aim for >30 min of physical activity daily   Try having a dannon light and fit yogurt at night instead of ice cream (try freezing)  Try focusing on drinking water before and after work  Replace starches (crackers, bread, potatoes, rice) with vegetables   Try citracal petites calcium (400-500 mg, 3 x day) For multivitamins (contains 3000 IU Vitamin D, 1000 mcg B12) try:   Bariatric Advantage Ultra Multi with Iron - 3 per day ($51.95 for a 90 day supply).    Celebrate Multi-Complete 36/45/60 with Iron - 3 per day  ($49.95 for 90 day supply) the 18 mg   version does not have enough thiamin    Surgery date: 05/31/15 Surgery type: Sleeve gastrectomy Start weight at [facility]: 277.5 lbs on 04/08/2015 Weight today: 199.6 lbs  Weight change: 5 lbs weight gain Total weight lost: 77.9 lbs Weight loss goal: 180 lbs  TANITA  BODY COMP RESULTS  05/18/15 06/15/15 07/28/15 09/08/15 12/15/15 03/15/16   BMI (kg/m^2) 44.8 40.5 36.2 34.8 31.4 32.2   Fat Mass (lbs) 143.5 133.5 104 95.0 72.0 78.2   Fat Free Mass (lbs) 134 117.5 120 120.5 122.6 121.4   Total Body Water (lbs) 98 86 88 88.0 87.4 86.8

## 2016-03-27 ENCOUNTER — Encounter: Payer: Self-pay | Admitting: Neurology

## 2016-04-04 ENCOUNTER — Encounter (HOSPITAL_COMMUNITY): Payer: Self-pay

## 2016-04-26 ENCOUNTER — Ambulatory Visit: Payer: Self-pay | Admitting: Neurology

## 2016-05-01 ENCOUNTER — Other Ambulatory Visit: Payer: Self-pay | Admitting: Physician Assistant

## 2016-05-01 DIAGNOSIS — I10 Essential (primary) hypertension: Secondary | ICD-10-CM

## 2016-05-01 NOTE — Telephone Encounter (Signed)
02/2016 last ov and labs 

## 2016-05-17 ENCOUNTER — Encounter: Payer: BC Managed Care – PPO | Attending: Physician Assistant | Admitting: Dietician

## 2016-05-17 DIAGNOSIS — Z029 Encounter for administrative examinations, unspecified: Secondary | ICD-10-CM | POA: Diagnosis not present

## 2016-05-17 DIAGNOSIS — Z6830 Body mass index (BMI) 30.0-30.9, adult: Secondary | ICD-10-CM

## 2016-05-17 DIAGNOSIS — E669 Obesity, unspecified: Secondary | ICD-10-CM

## 2016-05-17 NOTE — Progress Notes (Signed)
  Follow-up visit: 12 Months Post-Operative sleeve gastrectomy Surgery  Medical Nutrition Therapy:  Appt start time: 440 end time:  455  Primary concerns today: Post-operative Bariatric Surgery Nutrition Management. Lamae returns today having lost 5 lbs. Felt like she "gave up" in the last few weeks since her mother passed away. Eating foods that people have been bringing over to the house. Was doing well before she passed - having a lot of protein and not doing much sugar. Since she is feeling down now she is not ready to make any change to get back on track, though she does agree to work on getting in more fluids. Will call to make another appointment when she is ready to work on things again.    Surgery date: 05/31/15 Surgery type: Sleeve gastrectomy Start weight at [facility]: 277.5 lbs on 04/08/2015 Weight today: 194.0 lbs  Weight change: 5.6 lbs weight loss Total weight lost: 83.5 lbs Weight loss goal: 180 lbs  TANITA  BODY COMP RESULTS  05/18/15 06/15/15 07/28/15 09/08/15 12/15/15 03/15/16 05/17/16   BMI (kg/m^2) 44.8 40.5 36.2 34.8 31.4 32.2 31.3   Fat Mass (lbs) 143.5 133.5 104 95.0 72.0 78.2 79.6   Fat Free Mass (lbs) 134 117.5 120 120.5 122.6 121.4 114.4   Total Body Water (lbs) 98 86 88 88.0 87.4 86.8 81.6    Preferred Learning Style:   No preference indicated   Learning Readiness:   Ready  24-hr recall: B (AM): Premier protein (30g) Snk (AM): cheese and meat (7 g) L (PM): salad with 3 oz meat and cheese (21 g) Snk (PM):none D (PM):  4-5 oz Chicken/steak/ [place] patty or meat/shrimp with starch (28-35 g) Snk (PM):sweets lately  Fluid intake:  16 oz water/ice, protein shake (11 oz)  Estimated total protein intake: 66-81 grams per day   Medications: see list  Supplementation: taking bariatric advantage and feels better and taking calcium  Using straws: no Drinking while eating: a little Hair loss: No Carbonated beverages: no N/V/D/C: nausea sometimes when she is  thirsty; constipation has been an ongoing problem  Dumping syndrome: none  Recent physical activity:  None  Progress Towards Goal(s):  In progress.  Handouts given during visit include:  none   Nutritional Diagnosis:  [place]-3.3 Overweight/obesity related to past poor dietary habits and physical inactivity as evidenced by patient w/ recent sleeve gastrectomy surgery following dietary guidelines for continued weight loss.     Intervention:  Nutrition counseling provided. Goals:  Increase fluid intake to 64oz +   Teaching Method Utilized:  Visual Auditory Hands on  Barriers to learning/adherence to lifestyle change: none  Demonstrated degree of understanding via:  Teach Back   Monitoring/Evaluation:  Dietary intake, exercise, and body weight. Follow up prn.

## 2016-05-17 NOTE — Patient Instructions (Addendum)
Goals:  Increase fluid intake to 64oz +  Surgery date: 05/31/15 Surgery type: Sleeve gastrectomy Start weight at [facility]: 277.5 lbs on 04/08/2015 Weight today: 199.6 lbs  Weight change: 5.6 lbs weight loss Total weight lost: 83.5 lbs Weight loss goal: 180 lbs  TANITA  BODY COMP RESULTS  05/18/15 06/15/15 07/28/15 09/08/15 12/15/15 03/15/16 05/17/16   BMI (kg/m^2) 44.8 40.5 36.2 34.8 31.4 32.2 31.3   Fat Mass (lbs) 143.5 133.5 104 95.0 72.0 78.2 79.2   Fat Free Mass (lbs) 134 117.5 120 120.5 122.6 121.4 114.4   Total Body Water (lbs) 98 86 88 88.0 87.4 86.8 81.6

## 2016-06-22 ENCOUNTER — Other Ambulatory Visit: Payer: Self-pay | Admitting: Physician Assistant

## 2016-06-22 DIAGNOSIS — D509 Iron deficiency anemia, unspecified: Secondary | ICD-10-CM

## 2016-08-10 LAB — CBC AND DIFFERENTIAL
HCT: 34 % — AB (ref 36–46)
Hemoglobin: 9.9 g/dL — AB (ref 12.0–16.0)
Platelets: 354 10*3/uL (ref 150–399)
WBC: 4.2 10^3/mL

## 2016-08-12 ENCOUNTER — Other Ambulatory Visit: Payer: Self-pay | Admitting: Physician Assistant

## 2016-08-12 DIAGNOSIS — I10 Essential (primary) hypertension: Secondary | ICD-10-CM

## 2016-08-13 ENCOUNTER — Other Ambulatory Visit: Payer: Self-pay | Admitting: Physician Assistant

## 2016-08-14 NOTE — Telephone Encounter (Signed)
Meds ordered this encounter  Medications  . lisinopril (PRINIVIL,ZESTRIL) 20 MG tablet    Sig: Take 1 tablet (20 mg total) by mouth daily.    Dispense:  180 tablet    Refill:  0    Please advise patient she needs a visit for additional fills.    Please call patient to schedule follow-up in 08/2016.

## 2016-10-04 ENCOUNTER — Telehealth: Payer: Self-pay | Admitting: Physician Assistant

## 2016-10-04 NOTE — Telephone Encounter (Addendum)
Pt states that  She is needing a refill on her lipitor and she takes it twice a day not once and is needing to get 180   Best number [phone]

## 2016-10-06 MED ORDER — ATORVASTATIN CALCIUM 40 MG PO TABS: 40.0000 mg | ORAL_TABLET | Freq: Every day | ORAL | 3 refills | Status: DC

## 2016-10-06 NOTE — Telephone Encounter (Signed)
New prescription sent, for 40 mg tablets, 1 PO QD (rather than taking 2 of the 20 mg tablets).  Meds ordered this encounter  Medications  . atorvastatin (LIPITOR) 40 MG tablet    Sig: Take 1 tablet (40 mg total) by mouth daily.    Dispense:  90 tablet    Refill:  3    Order Specific Question:   Supervising Provider    Answer:   Brigitte Pulse, EVA N [[id number]]

## 2016-10-06 NOTE — Telephone Encounter (Signed)
Last ov and lab 02/2016 I see 1 qd, advice?

## 2016-10-09 ENCOUNTER — Telehealth: Payer: Self-pay | Admitting: Physician Assistant

## 2016-10-09 DIAGNOSIS — I10 Essential (primary) hypertension: Secondary | ICD-10-CM

## 2016-10-09 MED ORDER — LISINOPRIL 20 MG PO TABS: 40.0000 mg | ORAL_TABLET | Freq: Every day | ORAL | 0 refills | Status: DC

## 2016-10-09 NOTE — Telephone Encounter (Signed)
Last ov 02/2016, I donot see lisinopril bid?

## 2016-10-09 NOTE — Telephone Encounter (Signed)
Meds ordered this encounter  Medications  . lisinopril (PRINIVIL,ZESTRIL) 20 MG tablet    Sig: Take 2 tablets (40 mg total) by mouth daily.    Dispense:  180 tablet    Refill:  0    Please advise patient she needs a visit for additional fills.    Order Specific Question:   Supervising Provider    Answer:   Shawnee Knapp [phone]   Message last week documents her request for Lipitor. We clearly misunderstood that she needed LISINOPRIL instead.  Please help her schedule a follow-up visit with me.

## 2016-10-09 NOTE — Telephone Encounter (Signed)
Pt is requesting a refill on lisinopril the last refill request was sent in wrong -pt states that she takes it 2 times a day instead of once a day and she ran out and when this was requested like week as a new rx he avastatin was called in instead   Best number [phone]

## 2016-10-16 ENCOUNTER — Other Ambulatory Visit: Payer: Self-pay | Admitting: Physician Assistant

## 2016-10-16 DIAGNOSIS — D509 Iron deficiency anemia, unspecified: Secondary | ICD-10-CM

## 2016-12-21 LAB — HM MAMMOGRAPHY

## 2016-12-22 ENCOUNTER — Ambulatory Visit (INDEPENDENT_AMBULATORY_CARE_PROVIDER_SITE_OTHER): Payer: BC Managed Care – PPO | Admitting: Physician Assistant

## 2016-12-22 ENCOUNTER — Encounter: Payer: Self-pay | Admitting: Physician Assistant

## 2016-12-22 VITALS — BP 148/85 | HR 59 | Temp 97.6°F | Resp 18 | Ht 66.0 in | Wt 210.8 lb

## 2016-12-22 DIAGNOSIS — D500 Iron deficiency anemia secondary to blood loss (chronic): Secondary | ICD-10-CM | POA: Diagnosis not present

## 2016-12-22 DIAGNOSIS — Z23 Encounter for immunization: Secondary | ICD-10-CM

## 2016-12-22 DIAGNOSIS — E559 Vitamin D deficiency, unspecified: Secondary | ICD-10-CM | POA: Diagnosis not present

## 2016-12-22 DIAGNOSIS — M059 Rheumatoid arthritis with rheumatoid factor, unspecified: Secondary | ICD-10-CM | POA: Diagnosis not present

## 2016-12-22 DIAGNOSIS — E785 Hyperlipidemia, unspecified: Secondary | ICD-10-CM | POA: Diagnosis not present

## 2016-12-22 DIAGNOSIS — D509 Iron deficiency anemia, unspecified: Secondary | ICD-10-CM

## 2016-12-22 DIAGNOSIS — Z0000 Encounter for general adult medical examination without abnormal findings: Secondary | ICD-10-CM | POA: Diagnosis not present

## 2016-12-22 DIAGNOSIS — Z113 Encounter for screening for infections with a predominantly sexual mode of transmission: Secondary | ICD-10-CM | POA: Diagnosis not present

## 2016-12-22 DIAGNOSIS — Z1231 Encounter for screening mammogram for malignant neoplasm of breast: Secondary | ICD-10-CM | POA: Diagnosis not present

## 2016-12-22 DIAGNOSIS — Z124 Encounter for screening for malignant neoplasm of cervix: Secondary | ICD-10-CM

## 2016-12-22 DIAGNOSIS — Z6834 Body mass index (BMI) 34.0-34.9, adult: Secondary | ICD-10-CM

## 2016-12-22 DIAGNOSIS — Z1239 Encounter for other screening for malignant neoplasm of breast: Secondary | ICD-10-CM

## 2016-12-22 DIAGNOSIS — I10 Essential (primary) hypertension: Secondary | ICD-10-CM

## 2016-12-22 DIAGNOSIS — K219 Gastro-esophageal reflux disease without esophagitis: Secondary | ICD-10-CM | POA: Diagnosis not present

## 2016-12-22 LAB — POCT URINALYSIS DIP (MANUAL ENTRY)
Bilirubin, UA: NEGATIVE
GLUCOSE UA: NEGATIVE mg/dL
Ketones, POC UA: NEGATIVE mg/dL
Leukocytes, UA: NEGATIVE
Nitrite, UA: NEGATIVE
PH UA: 6 (ref 5.0–8.0)
Protein Ur, POC: NEGATIVE mg/dL
RBC UA: NEGATIVE
SPEC GRAV UA: 1.02 (ref 1.010–1.025)
UROBILINOGEN UA: 0.2 U/dL

## 2016-12-22 MED ORDER — OMEPRAZOLE 40 MG PO CPDR: 40.0000 mg | DELAYED_RELEASE_CAPSULE | Freq: Every day | ORAL | 3 refills | Status: DC

## 2016-12-22 MED ORDER — HYDROCHLOROTHIAZIDE 12.5 MG PO CAPS: 12.5000 mg | ORAL_CAPSULE | Freq: Every day | ORAL | 3 refills | Status: DC

## 2016-12-22 MED ORDER — ZOSTER VAC RECOMB ADJUVANTED 50 MCG/0.5ML IM SUSR: 0.5000 mL | Freq: Once | INTRAMUSCULAR | 1 refills | Status: AC

## 2016-12-22 MED ORDER — POLYSACCHARIDE IRON COMPLEX 150 MG PO CAPS: ORAL_CAPSULE | ORAL | 3 refills | Status: DC

## 2016-12-22 NOTE — Patient Instructions (Addendum)
   IF you received an x-ray today, you will receive an invoice from [facility]. Please contact [facility] at [phone] with questions or concerns regarding your invoice.   IF you received labwork today, you will receive an invoice from [facility]. Please contact [facility] at [phone] with questions or concerns regarding your invoice.   Our billing staff will not be able to assist you with questions regarding bills from these companies.  You will be contacted with the lab results as soon as they are available. The fastest way to get your results is to activate your My Chart account. Instructions are located on the last page of this paperwork. If you have not heard from us regarding the results in 2 weeks, please contact this office.     Keeping You Healthy  Get These Tests  Blood Pressure- Have your blood pressure checked by your healthcare provider at least once a year.  Normal blood pressure is 120/80.  Weight- Have your body mass index (BMI) calculated to screen for obesity.  BMI is a measure of body fat based on height and weight.  You can calculate your own BMI at www.nhlbisupport.com/bmi/  Cholesterol- Have your cholesterol checked every year.  Diabetes- Have your blood sugar checked every year if you have high blood pressure, high cholesterol, a family history of diabetes or if you are overweight.  Pap Test - Have a pap test every 1 to 5 years if you have been sexually active.  If you are older than 65 and recent pap tests have been normal you may not need additional pap tests.  In addition, if you have had a hysterectomy  for benign disease additional pap tests are not necessary.  Mammogram-Yearly mammograms are essential for early detection of breast cancer  Screening for Colon Cancer- Colonoscopy starting at age 50. Screening may begin sooner depending on your family history and other health conditions.  Follow up colonoscopy as directed by your  Gastroenterologist.  Screening for Osteoporosis- Screening begins at age 65 with bone density scanning, sooner if you are at higher risk for developing Osteoporosis.  Get these medicines  Calcium with Vitamin D- Your body requires 1200-1500 mg of Calcium a day and 800-1000 IU of Vitamin D a day.  You can only absorb 500 mg of Calcium at a time therefore Calcium must be taken in 2 or 3 separate doses throughout the day.  Hormones- Hormone therapy has been associated with increased risk for certain cancers and heart disease.  Talk to your healthcare provider about if you need relief from menopausal symptoms.  Aspirin- Ask your healthcare provider about taking Aspirin to prevent Heart Disease and Stroke.  Get these Immuniztions  Flu shot- Every fall  Pneumonia shot- Once after the age of 65; if you are younger ask your healthcare provider if you need a pneumonia shot.  Tetanus- Every ten years.  Zostavax- Once after the age of 60 to prevent shingles.  Take these steps  Don't smoke- Your healthcare provider can help you quit. For tips on how to quit, ask your healthcare provider or go to www.smokefree.gov or call 1-800 QUIT-NOW.  Be physically active- Exercise 5 days a week for a minimum of 30 minutes.  If you are not already physically active, start slow and gradually work up to 30 minutes of moderate physical activity.  Try walking, dancing, bike riding, swimming, etc.  Eat a healthy diet- Eat a variety of healthy foods such as fruits, vegetables, whole grains, low   fat milk, low fat cheeses, yogurt, lean meats, chicken, fish, eggs, dried beans, tofu, etc.  For more information go to www.thenutritionsource.org  Dental visit- Brush and floss teeth twice daily; visit your dentist twice a year.  Eye exam- Visit your Optometrist or Ophthalmologist yearly.  Drink alcohol in moderation- Limit alcohol intake to one drink or less a day.  Never drink and drive.  Depression- Your emotional  health is as important as your physical health.  If you're feeling down or losing interest in things you normally enjoy, please talk to your healthcare provider.  Seat Belts- can save your life; always wear one  Smoke/Carbon Monoxide detectors- These detectors need to be installed on the appropriate level of your home.  Replace batteries at least once a year.  Violence- If anyone is threatening or hurting you, please tell your healthcare provider.  Living Will/ Health care power of attorney- Discuss with your healthcare provider and family.  

## 2016-12-22 NOTE — Progress Notes (Signed)
Subjective:    Patient ID: Laurie Hodge, female    DOB: 1961/05/26, 56 y.o.   MRN: [id number] PCP: Harrison Mons, PA-C   HPI Presents for complete physical evaluation.  Cervical cancer screening: last pap smear 2013. Due today. Breast cancer screening: reports she had her mammogram yesterday. Colorectal cancer screening: last colonoscopy 03/2013. On 10 year plan. HIV screening: screen today. STI screening: screen today. Seasonal influenza vaccine: usually does not receive flu vaccine. Received once or twice before and became ill afterward. Td/Tdap vaccination: due today. Pneumococcal vaccination: UTD Zoster vaccination: will administer first dose today.  Frequency of dental evaluation: does not see regularly. Has been several years. It makes her anxious.  Frequency of eye evaluation: has been two years.   She reports having joint swelling and pain 24 hours after the last few times she took her medication, specifically in her right wrist and shoulder. For this reason she did not take her Humira last week. She has an appointment with rheumatology in 3 days. Endorses occasional numbness in her fingers and toes, but this is not new and started when she started her RA medications.  She endorses dizziness, headaches and constipation as well as pain in her back and abdomen. She reports she has these symptoms onset simultaneously whenever she does not drink enough water. They resolve once she hydrates herself. Denies syncope or weakness.  She will skip a period 2-3 times a year. LMP 11/07/2016. Cycles are regular. Periods last 5-7 days with normal flow.   Does not take home BP and is not taking HCTZ. She says she simply forgot. She would like a refill on omeprazole and her iron pills.    Patient Active Problem List   Diagnosis Date Noted  . Osteoarthritis 02/05/2015  . Hyperlipidemia 11/06/2013  . Rheumatoid arthritis ([place]) 06/10/2013  . BMI 32.0-32.9,adult   . GERD  (gastroesophageal reflux disease)   . OSA (obstructive sleep apnea)     Class: Minor  . HTN (hypertension)   . HSV-2 infection   . Vitamin D insufficiency   . Iron (Fe) deficiency anemia    Prior to Admission medications   Medication Sig Start Date End Date Taking? Authorizing Provider  acetaminophen (TYLENOL) 500 MG tablet Take 1,000 mg by mouth as needed (pain).    Yes [provider]  atorvastatin (LIPITOR) 40 MG tablet Take 1 tablet (40 mg total) by mouth daily. 10/06/16  Yes Jeffery, Chelle, PA-C  Biotin 5 MG CAPS Take by mouth.   Yes [provider]  calcium-vitamin D (OSCAL WITH D) 500-200 MG-UNIT tablet Take 1 tablet by mouth 3 (three) times daily.   Yes [provider]  cetirizine (ZYRTEC) 10 MG tablet Take 10 mg by mouth daily as needed for allergies.    Yes [provider]  cholecalciferol (VITAMIN D) 1000 UNITS tablet Take 2,000 Units by mouth daily.    Yes [provider]  Cyanocobalamin (VITAMIN B-12) 2500 MCG SUBL Place 2,500 mcg under the tongue daily.   Yes [provider]  docusate sodium (COLACE) 100 MG capsule Take 100 mg by mouth as needed.    Yes [provider]  Fish Oil-Krill Oil (KRILL OIL PLUS) CAPS Take 750 mg by mouth daily.   Yes [provider]  lisinopril (PRINIVIL,ZESTRIL) 20 MG tablet Take 2 tablets (40 mg total) by mouth daily. 10/09/16  Yes Jeffery, Chelle, PA-C  meloxicam (MOBIC) 7.5 MG tablet Take 7.5 mg by mouth as needed for pain.  Yes [provider]  Multiple Vitamin (MULTIVITAMIN WITH MINERALS) TABS tablet Take 1 tablet by mouth 2 (two) times daily.   Yes [provider]  omeprazole (PRILOSEC) 40 MG capsule Take 1 capsule (40 mg total) by mouth daily. 12/22/16  Yes Jeffery, Chelle, PA-C  Adalimumab (HUMIRA PEN) 40 MG/0.8ML PNKT Inject 40 mg into the skin every 14 (fourteen) days. Patient not taking: Reported on 12/22/2016 07/06/15   Harrison Mons, PA-C  iron  polysaccharides (FERREX 150) 150 MG capsule TAKE ONE CAPSULE BY MOUTH TWICE DAILY. 12/22/16   Harrison Mons, PA-C  senna (SENOKOT) 8.6 MG TABS tablet Take 2 tablets by mouth as needed for mild constipation. Reported on 10/12/2015    [provider]      Allergies  Allergen Reactions  . Adhesive [Tape] Rash    Skin Peels    Review of Systems  Constitutional: Positive for fatigue. Negative for appetite change, chills and fever.  HENT: Negative for sinus pressure and sneezing.   Eyes: Negative for visual disturbance.  Cardiovascular: Negative for chest pain, palpitations and leg swelling.  Gastrointestinal: Positive for abdominal pain, blood in stool and constipation. Negative for nausea and vomiting.  Genitourinary: Negative for difficulty urinating, dysuria and hematuria.  Musculoskeletal: Positive for arthralgias.  Skin: Negative for rash.  Neurological: Positive for numbness. Negative for dizziness, syncope and weakness.  Psychiatric/Behavioral: The patient is not nervous/anxious.    Notes blood in stool is because of hemorrhoids. Not new.     Objective:   Physical Exam  Constitutional: She appears well-developed and well-nourished.  BP (!) 163/93   Pulse (!) 59   Temp 97.6 F (36.4 C) (Oral)   Resp 18   Ht 5\' 6"  (1.676 m)   Wt 210 lb 12.8 oz (95.6 kg)   LMP 11/07/2016   SpO2 98%   BMI 34.02 kg/m    HENT:  Head: Normocephalic and atraumatic.  Right Ear: External ear normal.  Left Ear: External ear normal.  Nose: Nose normal.  Mouth/Throat: Oropharynx is clear and moist.  Eyes: Conjunctivae and EOM are normal. Pupils are equal, round, and reactive to light.  Neck: Normal range of motion. Neck supple. No thyromegaly present.  Cardiovascular: Normal rate, regular rhythm, normal heart sounds and intact distal pulses.   Pulses:      Radial pulses are 2+ on the right side, and 2+ on the left side.       Dorsalis pedis pulses are 2+ on the right side, and 2+ on  the left side.       Posterior tibial pulses are 2+ on the right side, and 2+ on the left side.  Pulmonary/Chest: Effort normal and breath sounds normal.  Abdominal: Soft. Normal appearance and bowel sounds are normal. There is no tenderness. There is no rigidity and no guarding.  Lower abdomen firm to palpation with probable stool.  Musculoskeletal: Normal range of motion.       Right elbow: Normal.      Right wrist: Normal.  Lymphadenopathy:    She has no cervical adenopathy.  Neurological: She is alert.  Skin: Skin is warm and dry. No rash noted. No erythema.  Psychiatric: She has a normal mood and affect. Her behavior is normal.     Depression screen [facility] 2/9 12/22/2016 05/17/2016 03/15/2016 12/15/2015 10/19/2015  Decreased Interest 0 0 0 0 0  Down, Depressed, Hopeless 0 0 0 0 0  PHQ - 2 Score 0 0 0 0 0  Assessment & Plan:   1. Annual physical exam Age appropriate guidance given. Encouraged regular dental and ophthalmology visits. Follow up in 1 year.  2. Essential hypertension Labs pending. Resume taking HCTZ. Follow up in 3 months. - CBC with Differential/Platelet - Comprehensive metabolic panel - TSH - POCT urinalysis dipstick  3. Hyperlipidemia, unspecified hyperlipidemia type Labs pending. Continue current regimen. Adjust based on lab results. - Comprehensive metabolic panel - Lipid panel  4. Vitamin D insufficiency Labs pending. - VITAMIN D 25 Hydroxy (Vit-D Deficiency, Fractures)  5. Iron deficiency anemia due to chronic blood loss Labs pending. - CBC with Differential/Platelet  6. Screening for cervical cancer Pap with HPV performed today. - Pap IG and HPV (high risk) DNA detection  7. Rheumatoid arthritis with positive rheumatoid factor, involving unspecified site [facility]) Encouraged discussing medication reaction with rheumatology at her appointment in 3 days.  8. Screening examination for sexually transmitted disease Labs pending. - Hepatitis B  surface antigen - Hepatitis C antibody - RPR - GC/Chlamydia Probe Amp  9. Breast cancer screening Mammogram yesterday.  10. Need for Tdap vaccination Tdap administered today. - Tdap vaccine greater than or equal to 7yo IM - Care order/instruction:  11. Need for shingles vaccine First dose of shingrix administered today. - Zoster Vac Recomb Adjuvanted [facility]) injection; Inject 0.5 mLs into the muscle once.  Dispense: 1 each; Refill: 1  12. Iron deficiency anemia, unspecified iron deficiency anemia type RF given. - iron polysaccharides (FERREX 150) 150 MG capsule; TAKE ONE CAPSULE BY MOUTH TWICE DAILY.  Dispense: 180 capsule; Refill: 3  13. Gastroesophageal reflux disease, esophagitis presence not specified RF given. - omeprazole (PRILOSEC) 40 MG capsule; Take 1 capsule (40 mg total) by mouth daily.  Dispense: 90 capsule; Refill: 3  14. BMI 34.0-34.9,adult Interested in pursuing weight loss. Referral given to [facility].  - Amb Ref to Medical Weight Management

## 2016-12-22 NOTE — Progress Notes (Signed)
Patient ID: ZANAI MALLARI, female    DOB: [birth year], 56 y.o.   MRN: [id number]  PCP: Harrison Mons, PA-C  Chief Complaint  Patient presents with  . Annual Exam    with pap  . Medication Refill    Omeprazole, Ferrex; has not been taking the BP meds  . Immunizations    discussed Tdap    Subjective:   Presents for [employer].  Cervical cancer screening: last pap smear 2013. Due today. Breast cancer screening: reports she had her mammogram yesterday. Colorectal cancer screening: last colonoscopy 03/2013. On 10 year plan. HIV screening: screen today. STI screening: screen today. Seasonal influenza vaccine: usually does not receive flu vaccine. Received once or twice before and became ill afterward. Td/Tdap vaccination: due today. Pneumococcal vaccination: UTD Zoster vaccination: will order today.  Frequency of dental evaluation: does not see regularly. Has been several years. It makes her anxious.  Frequency of eye evaluation: has been two years.   Rheumatoid arthritis managed by rheumatology. She has had RA flares following the last 2 doses of Humira, so has not taken her dose this week. She has an appointment with Marella Chimes, PA-C on 12/25/2016 to assess this issue.  Notes that when she doesn't get enough oral hydration, she develops dizziness, HA and constipation. Constipation aggravates the hemorrhoid. Rehydrating quickly resolves the symptoms.  Has not been taking HCTZ, mostly because she forgets it.    Patient Active Problem List   Diagnosis Date Noted  . Osteoarthritis 02/05/2015  . Hyperlipidemia 11/06/2013  . Rheumatoid arthritis ([place]) 06/10/2013  . BMI 32.0-32.9,adult   . GERD (gastroesophageal reflux disease)   . OSA (obstructive sleep apnea)     Class: Minor  . HTN (hypertension)   . HSV-2 infection   . Vitamin D insufficiency   . Iron (Fe) deficiency anemia     Past Medical History:  Diagnosis Date  . Allergy   . Arthritis    RA  .  Depression   . GERD (gastroesophageal reflux disease)   . Hemorrhoid   . HSV-2 infection    IgG  . HTN (hypertension)   . Hyperlipidemia   . Iron (Fe) deficiency anemia   . Obesity, Class III, BMI 40-49.9 (morbid obesity) ([place])   . OSA on CPAP    cpap broke this week  . OSA on CPAP   . Ulcer    gastric - h. pylori positive  . Vitamin D insufficiency      Prior to Admission medications   Medication Sig Start Date End Date Taking? Authorizing Provider  acetaminophen (TYLENOL) 500 MG tablet Take 1,000 mg by mouth as needed (pain).    Yes [provider]  atorvastatin (LIPITOR) 40 MG tablet Take 1 tablet (40 mg total) by mouth daily. 10/06/16  Yes Shafer Swamy, PA-C  Biotin 5 MG CAPS Take by mouth.   Yes [provider]  calcium-vitamin D (OSCAL WITH D) 500-200 MG-UNIT tablet Take 1 tablet by mouth 3 (three) times daily.   Yes [provider]  cetirizine (ZYRTEC) 10 MG tablet Take 10 mg by mouth daily as needed for allergies.    Yes [provider]  cholecalciferol (VITAMIN D) 1000 UNITS tablet Take 2,000 Units by mouth daily.    Yes [provider]  Cyanocobalamin (VITAMIN B-12) 2500 MCG SUBL Place 2,500 mcg under the tongue daily.   Yes [provider]  docusate sodium (COLACE) 100 MG capsule Take 100 mg by mouth as needed.  Yes [provider]  Fish Oil-Krill Oil (KRILL OIL PLUS) CAPS Take 750 mg by mouth daily.   Yes [provider]  lisinopril (PRINIVIL,ZESTRIL) 20 MG tablet Take 2 tablets (40 mg total) by mouth daily. 10/09/16  Yes Rondal Vandevelde, PA-C  meloxicam (MOBIC) 7.5 MG tablet Take 7.5 mg by mouth as needed for pain.   Yes [provider]  Multiple Vitamin (MULTIVITAMIN WITH MINERALS) TABS tablet Take 1 tablet by mouth 2 (two) times daily.   Yes [provider]  omeprazole (PRILOSEC) 40 MG capsule TAKE ONE CAPSULE BY MOUTH ONCE DAILY 08/14/16  Yes Keifer Habib, PA-C  Adalimumab  (HUMIRA PEN) 40 MG/0.8ML PNKT Inject 40 mg into the skin every 14 (fourteen) days. Patient not taking: Reported on 12/22/2016 07/06/15   Harrison Mons, PA-C  FERREX 150 150 MG capsule TAKE ONE CAPSULE BY MOUTH TWICE DAILY. PLEASE FOLLOW UP WITH [facility] ASAP. YOU WERE DUE 1 MO AFTER LAST VISIT 02/2016 Patient not taking: Reported on 12/22/2016 10/16/16   Harrison Mons, PA-C  hydrochlorothiazide (MICROZIDE) 12.5 MG capsule Take 1 capsule (12.5 mg total) by mouth daily. Patient not taking: Reported on 02/29/2016 12/29/14   Harrison Mons, PA-C  senna (SENOKOT) 8.6 MG TABS tablet Take 2 tablets by mouth as needed for mild constipation. Reported on 10/12/2015    [provider]    Allergies  Allergen Reactions  . Adhesive [Tape] Rash    Skin Peels    Past Surgical History:  Procedure Laterality Date  . LAPAROSCOPIC GASTRIC SLEEVE RESECTION N/A 05/31/2015   Procedure: LAPAROSCOPIC GASTRIC SLEEVE RESECTION;  Surgeon: Arta Bruce Kinsinger, MD;  Location: [facility];  Service: General;  Laterality: N/A;  . TUBAL LIGATION      Family History  Problem Relation Age of Onset  . Diabetes Mother   . Hypertension Mother   . Dementia Mother   . Stroke Mother 4  . Hyperlipidemia Mother   . Cancer Paternal Aunt        breast ca x 5 maternal aunts  . Diabetes Sister   . Heart disease Sister   . Hyperlipidemia Sister   . Hypertension Sister   . Heart disease Father   . Hyperlipidemia Father   . Hypertension Father   . Stroke Father   . Heart disease Sister   . Hyperlipidemia Sister   . Hypertension Sister   . Diabetes Brother   . Hyperlipidemia Brother   . Hypertension Brother   . Hypertension Son   . Colon cancer Neg Hx   . Esophageal cancer Neg Hx   . Rectal cancer Neg Hx   . Stomach cancer Neg Hx     Social History   Social History  . Marital status: Divorced    Spouse name: n/a  . Number of children: 3  . Years of education: 12   Occupational History  . [employer]  .  [employer] History Main Topics  . Smoking status: Never Smoker  . Smokeless tobacco: Never Used  . Alcohol use 0.0 oz/week     Comment: 2-3 times a year at special events  . Drug use: No  . Sexual activity: Not Currently    Partners: Male     Comment: not active since divorce from husband 2010   Other Topics Concern  . None   Social History Narrative   Lives with daughter. Her mother who has dementia and had a stroke lives with her during  the week and her siblings take turns staying with their mother on the weekends.      Denies caffeine use        Review of Systems Constitutional: Positive for fatigue. Negative for appetite change, chills and fever.  HENT: Negative for sinus pressure and sneezing.   Eyes: Negative for visual disturbance.  Cardiovascular: Negative for chest pain, palpitations and leg swelling.  Gastrointestinal: Positive for abdominal pain, blood in stool (due to hemorrhoids) and constipation. Negative for nausea and vomiting.  Genitourinary: Negative for difficulty urinating, dysuria and hematuria.  Musculoskeletal: Positive for arthralgias.  Skin: Negative for rash.  Neurological: Positive for numbness (arm). Negative for dizziness, syncope and weakness.  Psychiatric/Behavioral: The patient is not nervous/anxious.       Objective:  Physical Exam  Constitutional: She is oriented to person, place, and time. Vital signs are normal. She appears well-developed and well-nourished. She is active and cooperative. No distress.  BP (!) 148/85   Pulse (!) 59   Temp 97.6 F (36.4 C) (Oral)   Resp 18   Ht 5\' 6"  (1.676 m)   Wt 210 lb 12.8 oz (95.6 kg)   LMP 11/07/2016   SpO2 98%   BMI 34.02 kg/m    HENT:  Head: Normocephalic and atraumatic.  Right Ear: Hearing, tympanic membrane, external ear and ear canal normal. No foreign bodies.  Left Ear: Hearing, tympanic membrane, external ear and ear canal  normal. No foreign bodies.  Nose: Nose normal.  Mouth/Throat: Uvula is midline, oropharynx is clear and moist and mucous membranes are normal. No oral lesions. Normal dentition. No dental abscesses or uvula swelling. No oropharyngeal exudate.  Eyes: Conjunctivae, EOM and lids are normal. Pupils are equal, round, and reactive to light. Right eye exhibits no discharge. Left eye exhibits no discharge. No scleral icterus.  Fundoscopic exam:      The right eye shows no arteriolar narrowing, no AV nicking, no exudate, no hemorrhage and no papilledema.       The left eye shows no arteriolar narrowing, no AV nicking, no exudate, no hemorrhage and no papilledema.  Neck: Trachea normal, normal range of motion and full passive range of motion without pain. Neck supple. No spinous process tenderness and no muscular tenderness present. No thyroid mass and no thyromegaly present.  Cardiovascular: Normal rate, regular rhythm, normal heart sounds, intact distal pulses and normal pulses.   Pulmonary/Chest: Effort normal and breath sounds normal. She exhibits no tenderness and no retraction. Right breast exhibits no inverted nipple, no mass, no nipple discharge, no skin change and no tenderness. Left breast exhibits no inverted nipple, no mass, no nipple discharge, no skin change and no tenderness. Breasts are symmetrical.  Abdominal: Soft. Normal appearance and bowel sounds are normal. She exhibits no distension and no mass. There is no hepatosplenomegaly. There is no tenderness. There is no rigidity, no rebound, no guarding, no CVA tenderness, no tenderness at McBurney's point and negative Murphy's sign. No hernia. Hernia confirmed negative in the right inguinal area and confirmed negative in the left inguinal area.  Genitourinary: Rectum normal, vagina normal and uterus normal. Rectal exam shows no external hemorrhoid and no fissure. No breast swelling, tenderness, discharge or bleeding. Pelvic exam was performed with  patient supine. No labial fusion. There is no rash, tenderness, lesion or injury on the right labia. There is no rash, tenderness, lesion or injury on the left labia. Cervix exhibits no motion tenderness, no discharge and no friability. Right adnexum displays no  mass, no tenderness and no fullness. Left adnexum displays no mass, no tenderness and no fullness. No erythema, tenderness or bleeding in the vagina. No foreign body in the vagina. No signs of injury around the vagina. No vaginal discharge found.  Musculoskeletal: She exhibits no edema or tenderness.       Cervical back: Normal.       Thoracic back: Normal.       Lumbar back: Normal.  Lymphadenopathy:       Head (right side): No tonsillar, no preauricular, no posterior auricular and no occipital adenopathy present.       Head (left side): No tonsillar, no preauricular, no posterior auricular and no occipital adenopathy present.    She has no cervical adenopathy.    She has no axillary adenopathy.       Right: No inguinal and no supraclavicular adenopathy present.       Left: No inguinal and no supraclavicular adenopathy present.  Neurological: She is alert and oriented to person, place, and time. She has normal strength and normal reflexes. No cranial nerve deficit. She exhibits normal muscle tone. Coordination and gait normal.  Skin: Skin is warm, dry and intact. No rash noted. She is not diaphoretic. No cyanosis or erythema. Nails show no clubbing.  Psychiatric: She has a normal mood and affect. Her speech is normal and behavior is normal. Judgment and thought content normal.     Wt Readings from Last 3 Encounters:  12/22/16 210 lb 12.8 oz (95.6 kg)  05/17/16 194 lb (88 kg)  03/15/16 199 lb 9.6 oz (90.5 kg)    BP Readings from Last 3 Encounters:  12/22/16 (!) 148/85  02/29/16 122/70  01/03/16 (!) 148/82       Assessment & Plan:   Problem List Items Addressed This Visit    Hyperlipidemia (Chronic)    Await labs. Adjust  regimen as indicated by results.       Relevant Medications   hydrochlorothiazide (MICROZIDE) 12.5 MG capsule   Other Relevant Orders   Comprehensive metabolic panel   Lipid panel   BMI 34.0-34.9,adult    Refer to medical weight management.      Relevant Orders   Amb Ref to Medical Weight Management   GERD (gastroesophageal reflux disease)    Stable. Continue omeprazole.      Relevant Medications   omeprazole (PRILOSEC) 40 MG capsule   HTN (hypertension)    Not controlled. Resume HCTZ. Continue lisinopril 40 mg.      Relevant Medications   hydrochlorothiazide (MICROZIDE) 12.5 MG capsule   Other Relevant Orders   CBC with Differential/Platelet   Comprehensive metabolic panel   TSH   POCT urinalysis dipstick (Completed)   Vitamin D insufficiency    Update level. Adjust supplementation as indicated.      Relevant Orders   VITAMIN D 25 Hydroxy (Vit-D Deficiency, Fractures)   Iron (Fe) deficiency anemia    Periods are becoming irregular. Continue iron supplementation.      Relevant Medications   iron polysaccharides (FERREX 150) 150 MG capsule   Other Relevant Orders   CBC with Differential/Platelet   Rheumatoid arthritis ([place])    Follow-up with rheumatology 12/25/16 as planned.      Relevant Medications   meloxicam (MOBIC) 7.5 MG tablet    Other Visit Diagnoses    Annual physical exam    -  Primary   Age appropriate health guidance provided.   Screening for cervical cancer       if  cytology and HPV both negative, repeat co-testing in 5 years.   Relevant Orders   Pap IG, CT/NG NAA, and HPV (high risk)   Screening examination for sexually transmitted disease       Relevant Orders   Hepatitis B surface antigen   Hepatitis C antibody   RPR   Pap IG, CT/NG NAA, and HPV (high risk)   HIV antibody (with reflex)   Breast cancer screening       mammogram was done yesterday at [facility].   Need for Tdap vaccination       Relevant Orders   Tdap vaccine greater  than or equal to 7yo IM (Completed)   Care order/instruction: (Completed)   Need for shingles vaccine       Relevant Medications   Zoster Vac Recomb Adjuvanted [facility]) injection       Return in about 3 months (around 03/24/2017) for blood pressure and hyperlipidemia follow-up.   Fara Chute, PA-C [facility] [place]

## 2016-12-22 NOTE — Assessment & Plan Note (Signed)
Stable.  Continue omeprazole.

## 2016-12-22 NOTE — Assessment & Plan Note (Signed)
Periods are becoming irregular. Continue iron supplementation.

## 2016-12-22 NOTE — Assessment & Plan Note (Signed)
Follow-up with rheumatology 12/25/16 as planned.

## 2016-12-22 NOTE — Assessment & Plan Note (Signed)
Not controlled. Resume HCTZ. Continue lisinopril 40 mg.

## 2016-12-22 NOTE — Assessment & Plan Note (Signed)
Await labs. Adjust regimen as indicated by results.  

## 2016-12-22 NOTE — Assessment & Plan Note (Signed)
- 

## 2016-12-22 NOTE — Assessment & Plan Note (Signed)
Update level. Adjust supplementation as indicated.

## 2016-12-23 LAB — CBC WITH DIFFERENTIAL/PLATELET
BASOS ABS: 0 10*3/uL (ref 0.0–0.2)
Basos: 1 %
EOS (ABSOLUTE): 0.1 10*3/uL (ref 0.0–0.4)
Eos: 3 %
HEMATOCRIT: 40.8 % (ref 34.0–46.6)
Hemoglobin: 13 g/dL (ref 11.1–15.9)
Immature Grans (Abs): 0 10*3/uL (ref 0.0–0.1)
Immature Granulocytes: 0 %
LYMPHS ABS: 1.7 10*3/uL (ref 0.7–3.1)
Lymphs: 47 %
MCH: 26.4 pg — ABNORMAL LOW (ref 26.6–33.0)
MCHC: 31.9 g/dL (ref 31.5–35.7)
MCV: 83 fL (ref 79–97)
MONOS ABS: 0.4 10*3/uL (ref 0.1–0.9)
Monocytes: 11 %
Neutrophils Absolute: 1.3 10*3/uL — ABNORMAL LOW (ref 1.4–7.0)
Neutrophils: 38 %
Platelets: 295 10*3/uL (ref 150–379)
RBC: 4.93 x10E6/uL (ref 3.77–5.28)
RDW: 16.7 % — AB (ref 12.3–15.4)
WBC: 3.5 10*3/uL (ref 3.4–10.8)

## 2016-12-23 LAB — LIPID PANEL
CHOL/HDL RATIO: 2.4 ratio (ref 0.0–4.4)
Cholesterol, Total: 191 mg/dL (ref 100–199)
HDL: 81 mg/dL (ref >39)
LDL Calculated: 99 mg/dL (ref 0–99)
TRIGLYCERIDES: 54 mg/dL (ref 0–149)
VLDL Cholesterol Cal: 11 mg/dL (ref 5–40)

## 2016-12-23 LAB — HIV ANTIBODY (ROUTINE TESTING W REFLEX): HIV SCREEN 4TH GENERATION: NONREACTIVE

## 2016-12-23 LAB — VITAMIN D 25 HYDROXY (VIT D DEFICIENCY, FRACTURES): Vit D, 25-Hydroxy: 47.4 ng/mL (ref 30.0–100.0)

## 2016-12-23 LAB — COMPREHENSIVE METABOLIC PANEL
A/G RATIO: 1.3 (ref 1.2–2.2)
ALBUMIN: 4.1 g/dL (ref 3.5–5.5)
ALT: 15 IU/L (ref 0–32)
AST: 18 IU/L (ref 0–40)
Alkaline Phosphatase: 56 IU/L (ref 39–117)
BUN / CREAT RATIO: 17 (ref 9–23)
BUN: 13 mg/dL (ref 6–24)
Bilirubin Total: 0.6 mg/dL (ref 0.0–1.2)
CO2: 24 mmol/L (ref 20–29)
Calcium: 9.3 mg/dL (ref 8.7–10.2)
Chloride: 103 mmol/L (ref 96–106)
Creatinine, Ser: 0.75 mg/dL (ref 0.57–1.00)
GFR calc Af Amer: 105 mL/min/{1.73_m2} (ref >59)
GFR, EST NON AFRICAN AMERICAN: 91 mL/min/{1.73_m2} (ref >59)
GLOBULIN, TOTAL: 3.2 g/dL (ref 1.5–4.5)
Glucose: 80 mg/dL (ref 65–99)
POTASSIUM: 3.9 mmol/L (ref 3.5–5.2)
SODIUM: 141 mmol/L (ref 134–144)
Total Protein: 7.3 g/dL (ref 6.0–8.5)

## 2016-12-23 LAB — RPR: RPR Ser Ql: NONREACTIVE

## 2016-12-23 LAB — TSH: TSH: 1.37 u[IU]/mL (ref 0.450–4.500)

## 2016-12-23 LAB — HEPATITIS C ANTIBODY: Hep C Virus Ab: 0.1 s/co ratio (ref 0.0–0.9)

## 2016-12-23 LAB — HEPATITIS B SURFACE ANTIGEN: Hepatitis B Surface Ag: NEGATIVE

## 2016-12-26 ENCOUNTER — Encounter: Payer: Self-pay | Admitting: Physician Assistant

## 2016-12-26 LAB — PAP IG, CT-NG NAA, HPV HIGH-RISK
Chlamydia, Nuc. Acid Amp: NEGATIVE
Gonococcus by Nucleic Acid Amp: NEGATIVE
HPV, HIGH-RISK: NEGATIVE
PAP Smear Comment: 0

## 2017-01-11 ENCOUNTER — Encounter (HOSPITAL_COMMUNITY): Payer: Self-pay

## 2017-01-26 ENCOUNTER — Other Ambulatory Visit: Payer: Self-pay | Admitting: Physician Assistant

## 2017-01-26 DIAGNOSIS — I10 Essential (primary) hypertension: Secondary | ICD-10-CM

## 2017-03-27 ENCOUNTER — Encounter: Payer: Self-pay | Admitting: Physician Assistant

## 2017-03-27 ENCOUNTER — Ambulatory Visit (INDEPENDENT_AMBULATORY_CARE_PROVIDER_SITE_OTHER): Payer: BC Managed Care – PPO | Admitting: Physician Assistant

## 2017-03-27 VITALS — BP 138/88 | HR 74 | Temp 98.5°F | Resp 18 | Ht 66.0 in | Wt 223.4 lb

## 2017-03-27 DIAGNOSIS — E785 Hyperlipidemia, unspecified: Secondary | ICD-10-CM | POA: Diagnosis not present

## 2017-03-27 DIAGNOSIS — I10 Essential (primary) hypertension: Secondary | ICD-10-CM

## 2017-03-27 DIAGNOSIS — Z6836 Body mass index (BMI) 36.0-36.9, adult: Secondary | ICD-10-CM

## 2017-03-27 DIAGNOSIS — Z23 Encounter for immunization: Secondary | ICD-10-CM | POA: Diagnosis not present

## 2017-03-27 MED ORDER — ATORVASTATIN CALCIUM 40 MG PO TABS: 40.0000 mg | ORAL_TABLET | Freq: Every day | ORAL | 3 refills | Status: DC

## 2017-03-27 MED ORDER — LISINOPRIL 20 MG PO TABS: 40.0000 mg | ORAL_TABLET | Freq: Every day | ORAL | 3 refills | Status: DC

## 2017-03-27 NOTE — Assessment & Plan Note (Signed)
Controlled when last checked, in 12/2016. Refill atorvastatin.

## 2017-03-27 NOTE — Assessment & Plan Note (Signed)
Discussed increasing her exercise and reducing calories.  Motivation has been low, but is improved, because she feels bad. Start walking. Add 1 minute at a time.

## 2017-03-27 NOTE — Patient Instructions (Signed)
     IF you received an x-ray today, you will receive an invoice from [facility]. Please contact [facility] at [phone] with questions or concerns regarding your invoice.   IF you received labwork today, you will receive an invoice from [facility]. Please contact [facility] at [phone] with questions or concerns regarding your invoice.   Our billing staff will not be able to assist you with questions regarding bills from these companies.  You will be contacted with the lab results as soon as they are available. The fastest way to get your results is to activate your My Chart account. Instructions are located on the last page of this paperwork. If you have not heard from us regarding the results in 2 weeks, please contact this office.     

## 2017-03-27 NOTE — Progress Notes (Signed)
Patient ID: Laurie Hodge, female    DOB: [birth year], 56 y.o.   MRN: [id number]  PCP: Harrison Mons, PA-C  Chief Complaint  Patient presents with  . Hypertension  . Hyperlipidemia  . Follow-up  . Depression    Depression scale score 22    Subjective:   Presents for evaluation of HTN.  She stopped HCTZ due to feeling bad when she took it. Dry mouth, bleeding gums and dizziness. She is tolerating her other medications without difficulty and needs refills of lisinopril and atorvastatin.  Agrees to the seasonal flu vaccine this year as she has a 46 month old grandson and wants to help keep him healthy.  Did not schedule with medical weight management. Has been feeling more depressed, less motivated. She's doing fine at work, but at home just wants to lie on the couch and watch TV. She isn't exercising, but is ready to commit to starting. Some thoughts of "why do I even bother, what is the point?" but no intention or plan. Nightmares associated with antidepressant therapy in the past make her not want to try those again. .  Review of Systems Constitutional: Positive for fatigue. Negative for fever and unexpected weight change.  HENT: Negative for ear pain and tinnitus.   Eyes: Negative for visual disturbance.  Respiratory: Positive for chest tightness (when she needs to belch, resolves following eructation).   Cardiovascular: Negative for chest pain, palpitations and leg swelling.  Gastrointestinal: Positive for constipation. Negative for abdominal pain, diarrhea, nausea and vomiting.  Genitourinary: Negative for difficulty urinating.  Musculoskeletal: Positive for arthralgias, joint swelling and myalgias.  Neurological: Negative for dizziness, syncope, light-headedness and headaches.  Psychiatric/Behavioral: Positive for dysphoric mood. Negative for sleep disturbance.   Depression screen [facility] 2/9 03/27/2017 12/22/2016 05/17/2016 03/15/2016 12/15/2015  Decreased Interest 3 0 0 0 0    Down, Depressed, Hopeless 2 0 0 0 0  PHQ - 2 Score 5 0 0 0 0  Altered sleeping 3 - - - -  Tired, decreased energy 3 - - - -  Change in appetite 3 - - - -  Feeling bad or failure about yourself  2 - - - -  Trouble concentrating 3 - - - -  Moving slowly or fidgety/restless 2 - - - -  Suicidal thoughts 1 - - - -  PHQ-9 Score 22 - - - -  Difficult doing work/chores Very difficult - - - -     Patient Active Problem List   Diagnosis Date Noted  . Osteoarthritis 02/05/2015  . Hyperlipidemia 11/06/2013  . Rheumatoid arthritis ([place]) 06/10/2013  . BMI 34.0-34.9,adult   . GERD (gastroesophageal reflux disease)   . OSA (obstructive sleep apnea)     Class: Minor  . HTN (hypertension)   . HSV-2 infection   . Vitamin D insufficiency   . Iron (Fe) deficiency anemia      Prior to Admission medications   Medication Sig Start Date End Date Taking? Authorizing Provider  acetaminophen (TYLENOL) 500 MG tablet Take 1,000 mg by mouth as needed (pain).    Yes [provider]  Adalimumab (HUMIRA PEN) 40 MG/0.8ML PNKT Inject 40 mg into the skin every 14 (fourteen) days. 07/06/15  Yes Aubri Gathright, PA-C  atorvastatin (LIPITOR) 40 MG tablet Take 1 tablet (40 mg total) by mouth daily. 10/06/16  Yes Daymen Hassebrock, PA-C  Biotin 5 MG CAPS Take by mouth.   Yes [provider]  calcium-vitamin D (OSCAL WITH D) 500-200  MG-UNIT tablet Take 1 tablet by mouth 3 (three) times daily.   Yes [provider]  cetirizine (ZYRTEC) 10 MG tablet Take 10 mg by mouth daily as needed for allergies.    Yes [provider]  cholecalciferol (VITAMIN D) 1000 UNITS tablet Take 2,000 Units by mouth daily.    Yes [provider]  Cyanocobalamin (VITAMIN B-12) 2500 MCG SUBL Place 2,500 mcg under the tongue daily.   Yes [provider]  docusate sodium (COLACE) 100 MG capsule Take 100 mg by mouth as needed.    Yes [provider]  Fish Oil-Krill Oil (KRILL OIL PLUS)  CAPS Take 750 mg by mouth daily.   Yes [provider]  hydrochlorothiazide (MICROZIDE) 12.5 MG capsule Take 1 capsule (12.5 mg total) by mouth daily. 12/22/16  No Xiomar Crompton, PA-C  iron polysaccharides (FERREX 150) 150 MG capsule TAKE ONE CAPSULE BY MOUTH TWICE DAILY. 12/22/16  Yes Annajulia Lewing, PA-C  lisinopril (PRINIVIL,ZESTRIL) 20 MG tablet Take 2 tablets (40 mg total) by mouth daily. 01/27/17  Yes Jannetta Massey, PA-C  meloxicam (MOBIC) 7.5 MG tablet Take 7.5 mg by mouth as needed for pain.   Yes [provider]  Multiple Vitamin (MULTIVITAMIN WITH MINERALS) TABS tablet Take 1 tablet by mouth 2 (two) times daily.   Yes [provider]  omeprazole (PRILOSEC) 40 MG capsule Take 1 capsule (40 mg total) by mouth daily. 12/22/16  Yes Chayah Mckee, PA-C  senna (SENOKOT) 8.6 MG TABS tablet Take 2 tablets by mouth as needed for mild constipation. Reported on 10/12/2015   Yes [provider]     Allergies  Allergen Reactions  . Adhesive [Tape] Rash    Skin Peels       Objective:  Physical Exam  Constitutional: She is oriented to person, place, and time. She appears well-developed and well-nourished. She is active and cooperative. No distress.  BP 138/88 (BP Location: Right Arm, Patient Position: Sitting, Cuff Size: Large)   Pulse 74   Temp 98.5 F (36.9 C) (Oral)   Resp 18   Ht 5\' 6"  (1.676 m)   Wt 223 lb 6.4 oz (101.3 kg)   LMP 03/17/2017   SpO2 98%   BMI 36.06 kg/m   HENT:  Head: Normocephalic and atraumatic.  Right Ear: Hearing normal.  Left Ear: Hearing normal.  Eyes: Conjunctivae are normal. No scleral icterus.  Neck: Normal range of motion. Neck supple. No thyromegaly present.  Cardiovascular: Normal rate, regular rhythm and normal heart sounds.   Pulses:      Radial pulses are 2+ on the right side, and 2+ on the left side.  Pulmonary/Chest: Effort normal and breath sounds normal.  Lymphadenopathy:       Head (right side): No  tonsillar, no preauricular, no posterior auricular and no occipital adenopathy present.       Head (left side): No tonsillar, no preauricular, no posterior auricular and no occipital adenopathy present.    She has no cervical adenopathy.       Right: No supraclavicular adenopathy present.       Left: No supraclavicular adenopathy present.  Neurological: She is alert and oriented to person, place, and time. No sensory deficit.  Skin: Skin is warm, dry and intact. No rash noted. No cyanosis or erythema. Nails show no clubbing.  Psychiatric: She has a normal mood and affect. Her speech is normal and behavior is normal.       Assessment & Plan:   Problem List  Items Addressed This Visit    Hyperlipidemia (Chronic)    Controlled when last checked, in 12/2016. Refill atorvastatin.      Relevant Medications   lisinopril (PRINIVIL,ZESTRIL) 20 MG tablet   atorvastatin (LIPITOR) 40 MG tablet   BMI 36.0-36.9,adult    Discussed increasing her exercise and reducing calories.  Motivation has been low, but is improved, because she feels bad. Start walking. Add 1 minute at a time.      HTN (hypertension) - Primary    Much improved. Continue OFF HCTZ. Continue lisinopril. Healthy lifestyle changes encouraged.      Relevant Medications   lisinopril (PRINIVIL,ZESTRIL) 20 MG tablet   atorvastatin (LIPITOR) 40 MG tablet    Other Visit Diagnoses    Flu vaccine need       Relevant Orders   Flu Vaccine QUAD 36+ mos IM (Completed)       Return in about 6 months (around 09/25/2017) for re-evaluation of blood pressure.   Fara Chute, PA-C [facility] at [place]

## 2017-03-27 NOTE — Progress Notes (Signed)
Subjective:    Patient ID: Laurie Hodge, female    DOB: [birth year], 56 y.o.   MRN: [id number]  HPI  Patient returns today for a 3 month follow up on hypertension and hyperlipidemia. Patient notes that she does not check her BP at home, but she does have a BP cuff at home.  She states she has not been taking the HCTZ because she noticed dry mouth, bleeding gums, and dizziness. She thinks she was not adequately hydrated while taking it. She is not on any blood thinners, and reports the bleeding gums stopped when she discontinued the HCTZ. Her BP is well controlled on the Lisinopril currently.  She states she has been compliant with her medications, taking them daily. She relates she is tolerating most of her medications well. She does note some muscle pain with statin use.  She reports her diet "has not been great, I just haven't cared much lately. I am in a low right now, I have bouts of depression." She feels that she has a good social support. She denies suicidal ideations. Patient has tried anti-depressants in the past, and does not think they help and cause her to have nightmares as an adverse effect. She is not interested in any prescription at this time.   Reports BM every other day, stable for her. She does take Docusate as needed.  Review of Systems  Constitutional: Positive for fatigue. Negative for fever and unexpected weight change.  HENT: Negative for ear pain and tinnitus.   Eyes: Negative for visual disturbance.  Respiratory: Positive for chest tightness.   Cardiovascular: Negative for chest pain, palpitations and leg swelling.  Gastrointestinal: Positive for constipation. Negative for abdominal pain, diarrhea, nausea and vomiting.  Genitourinary: Negative for difficulty urinating.  Musculoskeletal: Positive for arthralgias, joint swelling and myalgias.  Neurological: Negative for dizziness, syncope, light-headedness and headaches.  Psychiatric/Behavioral: Positive for  dysphoric mood. Negative for sleep disturbance.   Patient Active Problem List   Diagnosis Date Noted  . Osteoarthritis 02/05/2015  . Hyperlipidemia 11/06/2013  . Rheumatoid arthritis ([place]) 06/10/2013  . BMI 36.0-36.9,adult   . GERD (gastroesophageal reflux disease)   . OSA (obstructive sleep apnea)     Class: Minor  . HTN (hypertension)   . HSV-2 infection   . Vitamin D insufficiency   . Iron (Fe) deficiency anemia    Prior to Admission medications   Medication Sig Start Date End Date Taking? Authorizing Provider  acetaminophen (TYLENOL) 500 MG tablet Take 1,000 mg by mouth as needed (pain).    Yes [provider]  Adalimumab (HUMIRA PEN) 40 MG/0.8ML PNKT Inject 40 mg into the skin every 14 (fourteen) days. 07/06/15  Yes Jeffery, Chelle, PA-C  atorvastatin (LIPITOR) 40 MG tablet Take 1 tablet (40 mg total) by mouth daily. 03/27/17  Yes Jeffery, Chelle, PA-C  Biotin 5 MG CAPS Take by mouth.   Yes [provider]  calcium-vitamin D (OSCAL WITH D) 500-200 MG-UNIT tablet Take 1 tablet by mouth 3 (three) times daily.   Yes [provider]  cetirizine (ZYRTEC) 10 MG tablet Take 10 mg by mouth daily as needed for allergies.    Yes [provider]  cholecalciferol (VITAMIN D) 1000 UNITS tablet Take 2,000 Units by mouth daily.    Yes [provider]  Cyanocobalamin (VITAMIN B-12) 2500 MCG SUBL Place 2,500 mcg under the tongue daily.   Yes [provider]  docusate sodium (COLACE) 100 MG capsule Take 100 mg by mouth as needed.  Yes [provider]  Fish Oil-Krill Oil (KRILL OIL PLUS) CAPS Take 750 mg by mouth daily.   Yes [provider]  iron polysaccharides (FERREX 150) 150 MG capsule TAKE ONE CAPSULE BY MOUTH TWICE DAILY. 12/22/16  Yes Jeffery, Chelle, PA-C  lisinopril (PRINIVIL,ZESTRIL) 20 MG tablet Take 2 tablets (40 mg total) by mouth daily. 03/27/17  Yes Jeffery, Chelle, PA-C  meloxicam (MOBIC) 7.5 MG tablet Take 7.5 mg  by mouth as needed for pain.   Yes [provider]  Multiple Vitamin (MULTIVITAMIN WITH MINERALS) TABS tablet Take 1 tablet by mouth 2 (two) times daily.   Yes [provider]  omeprazole (PRILOSEC) 40 MG capsule Take 1 capsule (40 mg total) by mouth daily. 12/22/16  Yes Jeffery, Chelle, PA-C  senna (SENOKOT) 8.6 MG TABS tablet Take 2 tablets by mouth as needed for mild constipation. Reported on 10/12/2015   Yes [provider]   Allergies  Allergen Reactions  . Adhesive [Tape] Rash    Skin Peels   Social History   Social History  . Marital status: Divorced    Spouse name: n/a  . Number of children: 3  . Years of education: 12   Occupational History  . [employer]  .  [place] History Main Topics  . Smoking status: Never Smoker  . Smokeless tobacco: Never Used  . Alcohol use 0.0 oz/week     Comment: 2-3 times a year at special events  . Drug use: No  . Sexual activity: Not Currently    Partners: Male     Comment: not active since divorce from husband 2010   Other Topics Concern  . Not on file   Social History Narrative   Lives with daughter. Her mother who has dementia and had a stroke lives with her during the week and her siblings take turns staying with their mother on the weekends.      Denies caffeine use       Objective:   Physical Exam  Constitutional: She is oriented to person, place, and time. She appears well-developed and well-nourished. No distress.  HENT:  Head: Normocephalic and atraumatic.  Right Ear: External ear normal.  Left Ear: External ear normal.  Neck: Neck supple. No JVD present. Carotid bruit is not present. No tracheal deviation present. No thyromegaly present.  Cardiovascular: Normal rate, regular rhythm, normal heart sounds and intact distal pulses.  Exam reveals no gallop and no friction rub.   No murmur heard. Pulmonary/Chest: Effort normal and breath sounds  normal. No stridor. No respiratory distress. She has no wheezes. She has no rales. She exhibits no tenderness.  Lymphadenopathy:    She has no cervical adenopathy.  Neurological: She is alert and oriented to person, place, and time.  Skin: Skin is warm and dry. No rash noted. She is not diaphoretic. No erythema. No pallor.      Assessment & Plan:  1. Essential hypertension - Controlled, continue on current treatment.  - lisinopril (PRINIVIL,ZESTRIL) 20 MG tablet; Take 2 tablets (40 mg total) by mouth daily.  Dispense: 180 tablet; Refill: 3  2. Hyperlipidemia, unspecified hyperlipidemia type - Controlled, continue on current treatment - atorvastatin (LIPITOR) 40 MG tablet; Take 1 tablet (40 mg total) by mouth daily.  Dispense: 90 tablet; Refill: 3  3. Flu vaccine need - Completed in clinic today - Flu Vaccine QUAD 36+ mos IM  4. BMI 36.0-36.9,adult - Encouraged patient to start walking in  the evenings, starting with 5 minutes and increasing by 1 minute increments daily. This will help with her weight, blood pressure, and mood.  - Instructed her to increase water intake, avoid high sodium diets, and increasing fiber in diet.   Follow up in 6 months, sooner if needed.  Respectfully, Denny Levy PA-S 2019

## 2017-03-27 NOTE — Assessment & Plan Note (Signed)
Much improved. Continue OFF HCTZ. Continue lisinopril. Healthy lifestyle changes encouraged.

## 2017-09-10 ENCOUNTER — Encounter: Payer: Self-pay | Admitting: Physician Assistant

## 2017-09-10 ENCOUNTER — Other Ambulatory Visit: Payer: Self-pay

## 2017-09-10 ENCOUNTER — Ambulatory Visit: Payer: BC Managed Care – PPO | Admitting: Physician Assistant

## 2017-09-10 VITALS — BP 126/80 | HR 74 | Temp 98.7°F | Resp 16 | Ht 65.75 in | Wt 236.0 lb

## 2017-09-10 DIAGNOSIS — J111 Influenza due to unidentified influenza virus with other respiratory manifestations: Secondary | ICD-10-CM

## 2017-09-10 DIAGNOSIS — R6889 Other general symptoms and signs: Secondary | ICD-10-CM

## 2017-09-10 DIAGNOSIS — R69 Illness, unspecified: Secondary | ICD-10-CM | POA: Diagnosis not present

## 2017-09-10 MED ORDER — OSELTAMIVIR PHOSPHATE 75 MG PO CAPS: 75.0000 mg | ORAL_CAPSULE | Freq: Two times a day (BID) | ORAL | 0 refills | Status: DC

## 2017-09-10 MED ORDER — BENZONATATE 200 MG PO CAPS: 200.0000 mg | ORAL_CAPSULE | Freq: Two times a day (BID) | ORAL | 0 refills | Status: DC | PRN

## 2017-09-10 NOTE — Patient Instructions (Addendum)
I am fairly certain that she did have the flu.  It is best to stay away from children for the next week if possible.  Starting on Tamiflu along with a cough medication.  Please continue controlling her fever with Tylenol and/or ibuprofen.    IF you received an x-ray today, you will receive an invoice from [facility]. Please contact [facility] at [phone] with questions or concerns regarding your invoice.   IF you received labwork today, you will receive an invoice from [place]. Please contact [facility] at [phone] with questions or concerns regarding your invoice.   Our billing staff will not be able to assist you with questions regarding bills from these companies.  You will be contacted with the lab results as soon as they are available. The fastest way to get your results is to activate your My Chart account. Instructions are located on the last page of this paperwork. If you have not heard from [place] regarding the results in 2 weeks, please contact this office.

## 2017-09-10 NOTE — Progress Notes (Signed)
    09/10/2017 3:36 PM   DOB: 06-Jul-1960 / MRN: [id number]  SUBJECTIVE:  Laurie Hodge is a 57 y.o. female presenting for fever T-max 102, cough, headaches, body aches.  Symptoms started abruptly yesterday.  She has a history of sleep apnea and is not wearing her CPAP.  Denies chest pain and shortness of breath.  She is taking antipyretics at this time.  She is allergic to adhesive [tape].   She  has a past medical history of Allergy, Arthritis, Depression, GERD (gastroesophageal reflux disease), Hemorrhoid, HSV-2 infection, HTN (hypertension), Hyperlipidemia, Iron (Fe) deficiency anemia, Obesity, Class III, BMI 40-49.9 (morbid obesity) ([place]), OSA on CPAP, OSA on CPAP, Ulcer, and Vitamin D insufficiency.    She  reports that she has never smoked. She has never used smokeless tobacco. She reports that she drinks alcohol. She reports that she does not use drugs. She  reports that she does not currently engage in sexual activity but has had partners who are Female. The patient  has a past surgical history that includes Tubal ligation and Laparoscopic gastric sleeve resection (N/A, 05/31/2015).  Her family history includes Cancer in her paternal aunt; Dementia in her mother; Diabetes in her brother, mother, and sister; Heart disease in her father, sister, and sister; Hyperlipidemia in her brother, father, mother, sister, and sister; Hypertension in her brother, father, mother, sister, sister, and son; Stroke in her father; Stroke (age of onset: 59) in her mother.  ROS HPI  The problem list and medications were reviewed and updated by myself where necessary and exist elsewhere in the encounter.   OBJECTIVE:  BP 126/80   Pulse 74   Temp 98.7 F (37.1 C) (Oral)   Resp 16   Ht 5' 5.75" (1.67 m)   Wt 236 lb (107 kg)   LMP 05/08/2017 Comment: perimenopausal  SpO2 97%   BMI 38.38 kg/m   Physical Exam  Constitutional: She is active.  Non-toxic appearance.  Cardiovascular: Normal rate, regular  rhythm, S1 normal, S2 normal, normal heart sounds and intact distal pulses. Exam reveals no gallop, no friction rub and no decreased pulses.  No murmur heard. Pulmonary/Chest: Effort normal. No stridor. No tachypnea. No respiratory distress. She has no wheezes. She has no rales.  Abdominal: She exhibits no distension.  Musculoskeletal: She exhibits no edema.  Neurological: She is alert.  Skin: Skin is warm and dry. She is not diaphoretic. No pallor.    No results found for this or any previous visit (from the past 72 hour(s)).  No results found.  ASSESSMENT AND PLAN:  Laurie Hodge was seen today for fever 102 degrees, fatigue, nausea, achy and cough.  Diagnoses and all orders for this visit:  Flu-like symptoms  Influenza-like illness: No chest pain or abdominal pain today.  No urinary complaints.  Starting Tamiflu and benzonatate.  Advising that she continue antipyretic therapy at home. -     oseltamivir (TAMIFLU) 75 MG capsule; Take 1 capsule (75 mg total) by mouth 2 (two) times daily. -     benzonatate (TESSALON) 200 MG capsule; Take 1 capsule (200 mg total) by mouth 2 (two) times daily as needed for cough.    The patient is advised to call or return to clinic if she does not see an improvement in symptoms, or to seek the care of the closest emergency department if she worsens with the above plan.   Philis Fendt, MHS, PA-C [facility] 09/10/2017 3:36 PM

## 2017-09-24 ENCOUNTER — Encounter: Payer: Self-pay | Admitting: Physician Assistant

## 2017-09-25 ENCOUNTER — Encounter: Payer: Self-pay | Admitting: Physician Assistant

## 2017-09-25 ENCOUNTER — Ambulatory Visit: Payer: BC Managed Care – PPO | Admitting: Physician Assistant

## 2017-09-25 ENCOUNTER — Other Ambulatory Visit: Payer: Self-pay

## 2017-09-25 VITALS — BP 114/70 | HR 74 | Temp 98.8°F | Resp 16 | Ht 65.75 in | Wt 238.6 lb

## 2017-09-25 DIAGNOSIS — M1990 Unspecified osteoarthritis, unspecified site: Secondary | ICD-10-CM | POA: Diagnosis not present

## 2017-09-25 DIAGNOSIS — D500 Iron deficiency anemia secondary to blood loss (chronic): Secondary | ICD-10-CM | POA: Diagnosis not present

## 2017-09-25 DIAGNOSIS — E785 Hyperlipidemia, unspecified: Secondary | ICD-10-CM

## 2017-09-25 DIAGNOSIS — K219 Gastro-esophageal reflux disease without esophagitis: Secondary | ICD-10-CM

## 2017-09-25 DIAGNOSIS — I10 Essential (primary) hypertension: Secondary | ICD-10-CM

## 2017-09-25 DIAGNOSIS — M059 Rheumatoid arthritis with rheumatoid factor, unspecified: Secondary | ICD-10-CM | POA: Diagnosis not present

## 2017-09-25 MED ORDER — MELOXICAM 7.5 MG PO TABS: 7.5000 mg | ORAL_TABLET | ORAL | 1 refills | Status: DC | PRN

## 2017-09-25 MED ORDER — OMEPRAZOLE 40 MG PO CPDR: 40.0000 mg | DELAYED_RELEASE_CAPSULE | Freq: Every day | ORAL | 3 refills | Status: DC

## 2017-09-25 MED ORDER — POLYSACCHARIDE IRON COMPLEX 150 MG PO CAPS: ORAL_CAPSULE | ORAL | 3 refills | Status: DC

## 2017-09-25 NOTE — Assessment & Plan Note (Signed)
Continue Krill oil and atorvastatin. Await labs. Adjust regimen as indicated by results.

## 2017-09-25 NOTE — Progress Notes (Signed)
Subjective:    Patient ID: Laurie Hodge, female    DOB: Sep 19, 1960, 57 y.o.   MRN: [id number] Chief Complaint  Patient presents with  . Hyperlipidemia    follow up   . Hypertension    follow up     HPI   57 yo F presents for f/u of HTN, hyperlipidemia and iron deficient anemia. Reports recently having the flu and stopped her Humira. While off Humira, reports L sided arm and leg pain/tinging. Pt began taking Humira again Saturday (09/22/17) and reports symptoms have subsided.   Reports taking non of her meds this AM.  HTN - 114/70 today. Reports no problems with lisinopril. Reports taking OTC iron pills, but would prefer prescription iron.    Review of Systems  Constitutional: Negative.   HENT: Negative.   Eyes: Negative.   Respiratory: Negative.   Cardiovascular: Negative.   Gastrointestinal: Negative.   Endocrine: Negative.   Genitourinary: Negative.   Musculoskeletal: Negative.   Skin: Negative.   Allergic/Immunologic: Negative.   Neurological: Negative.   Hematological: Negative.   Psychiatric/Behavioral: Negative.     Patient Active Problem List   Diagnosis Date Noted  . Osteoarthritis 02/05/2015  . Hyperlipidemia 11/06/2013  . Rheumatoid arthritis ([place]) 06/10/2013  . BMI 36.0-36.9,adult   . GERD (gastroesophageal reflux disease)   . OSA (obstructive sleep apnea)     Class: Minor  . HTN (hypertension)   . HSV-2 infection   . Vitamin D insufficiency   . Iron (Fe) deficiency anemia    Past Medical History:  Diagnosis Date  . Allergy   . Arthritis    RA  . Depression   . GERD (gastroesophageal reflux disease)   . Hemorrhoid   . HSV-2 infection    IgG  . HTN (hypertension)   . Hyperlipidemia   . Iron (Fe) deficiency anemia   . Obesity, Class III, BMI 40-49.9 (morbid obesity) ([place])   . OSA on CPAP    cpap broke this week  . OSA on CPAP   . Ulcer    gastric - h. pylori positive  . Vitamin D insufficiency    Prior to Admission medications     Medication Sig Start Date End Date Taking? Authorizing Provider  calcium-vitamin D (OSCAL WITH D) 500-200 MG-UNIT tablet Take 1 tablet by mouth 3 (three) times daily.   Yes [provider]  Adalimumab (HUMIRA PEN) 40 MG/0.8ML PNKT Inject 40 mg into the skin every 14 (fourteen) days. 07/06/15   Harrison Mons, PA-C  atorvastatin (LIPITOR) 40 MG tablet Take 1 tablet (40 mg total) by mouth daily. 03/27/17   Harrison Mons, PA-C  Biotin 5 MG CAPS Take by mouth.    [provider]  cetirizine (ZYRTEC) 10 MG tablet Take 10 mg by mouth daily as needed for allergies.     [provider]  cholecalciferol (VITAMIN D) 1000 UNITS tablet Take 2,000 Units by mouth daily.     [provider]  Cyanocobalamin (VITAMIN B-12) 2500 MCG SUBL Place 2,500 mcg under the tongue daily.    [provider]  docusate sodium (COLACE) 100 MG capsule Take 100 mg by mouth as needed.     [provider]  Fish Oil-Krill Oil (KRILL OIL PLUS) CAPS Take 750 mg by mouth daily.    [provider]  iron polysaccharides (FERREX 150) 150 MG capsule TAKE ONE CAPSULE BY MOUTH TWICE DAILY. Patient not taking: Reported on 09/10/2017 12/22/16   Harrison Mons, PA-C  lisinopril (PRINIVIL,ZESTRIL)  20 MG tablet Take 2 tablets (40 mg total) by mouth daily. 03/27/17   Harrison Mons, PA-C  meloxicam (MOBIC) 7.5 MG tablet Take 7.5 mg by mouth as needed for pain.    [provider]  Multiple Vitamin (MULTIVITAMIN WITH MINERALS) TABS tablet Take 1 tablet by mouth 2 (two) times daily.    [provider]  omeprazole (PRILOSEC) 40 MG capsule Take 1 capsule (40 mg total) by mouth daily. 12/22/16   Harrison Mons, PA-C  senna (SENOKOT) 8.6 MG TABS tablet Take 2 tablets by mouth as needed for mild constipation. Reported on 10/12/2015    [provider]   Allergies  Allergen Reactions  . Adhesive [Tape] Rash    Skin Peels       Objective:   Physical Exam   Constitutional: She appears well-developed and well-nourished. No distress.  HENT:  Head: Normocephalic and atraumatic.  Eyes: Pupils are equal, round, and reactive to light. Conjunctivae and EOM are normal. Right eye exhibits no discharge. Left eye exhibits no discharge. No scleral icterus.  Neck: Normal range of motion. Neck supple.  Cardiovascular: Normal rate, regular rhythm, normal heart sounds and intact distal pulses. Exam reveals no gallop and no friction rub.  No murmur heard. Pulmonary/Chest: Effort normal and breath sounds normal. No respiratory distress. She has no wheezes. She has no rales. She exhibits no tenderness.  Skin: She is not diaphoretic.          Assessment & Plan:  1. Hyperlipidemia, unspecified hyperlipidemia type Will review labs and f/u with patient. - Lipid panel - Comprehensive metabolic panel  2. Essential hypertension At this time, pt's blood pressure is controlled despite not taking her lisinopril this AM. No changes to medication necessary at this time. - Comprehensive metabolic panel  3. Iron deficiency anemia due to chronic blood loss  - CBC with Differential/Platelet

## 2017-09-25 NOTE — Assessment & Plan Note (Signed)
Controlled.  Continue lisinopril 

## 2017-09-25 NOTE — Assessment & Plan Note (Signed)
Continue per rheumatology.

## 2017-09-25 NOTE — Assessment & Plan Note (Signed)
Continue PRN meloxicam 

## 2017-09-25 NOTE — Patient Instructions (Addendum)
Keep up the great work! I expect the pain will improve when you are back on the Humira regularly. When we ask that you fast for labs, it's ok to take your medications with water, black coffee or unsweet tea.    IF you received an x-ray today, you will receive an invoice from [facility]. Please contact [facility] at [phone] with questions or concerns regarding your invoice.   IF you received labwork today, you will receive an invoice from [place]. Please contact [facility] at [phone] with questions or concerns regarding your invoice.   Our billing staff will not be able to assist you with questions regarding bills from these companies.  You will be contacted with the lab results as soon as they are available. The fastest way to get your results is to activate your My Chart account. Instructions are located on the last page of this paperwork. If you have not heard from [place] regarding the results in 2 weeks, please contact this office.

## 2017-09-25 NOTE — Progress Notes (Signed)
Patient ID: Laurie Hodge, female    DOB: [birth year], 57 y.o.   MRN: [id number]  PCP: Harrison Mons, PA-C  Chief Complaint  Patient presents with  . Hyperlipidemia    follow up   . Hypertension    follow up     Subjective:   Presents for evaluation of HTN and hyperlipidemia.  Has not yet taken her medications this morning.  Overall, feels well. Recently had influenza, and held Humira while ill. LEFT sided RA pain has recurred.    Review of Systems  Constitutional: Negative.   HENT: Negative for sore throat.   Eyes: Negative for visual disturbance.  Respiratory: Negative for cough, chest tightness, shortness of breath and wheezing.   Cardiovascular: Negative for chest pain and palpitations.  Gastrointestinal: Negative for abdominal pain, diarrhea, nausea and vomiting.  Genitourinary: Negative for dysuria, frequency, hematuria and urgency.  Musculoskeletal: Positive for arthralgias and myalgias. Negative for gait problem and joint swelling.  Skin: Negative for rash.  Neurological: Negative for dizziness, weakness and headaches.  Psychiatric/Behavioral: Negative for decreased concentration. The patient is not nervous/anxious.        Patient Active Problem List   Diagnosis Date Noted  . Osteoarthritis 02/05/2015  . Hyperlipidemia 11/06/2013  . Rheumatoid arthritis ([place]) 06/10/2013  . BMI 36.0-36.9,adult   . GERD (gastroesophageal reflux disease)   . OSA (obstructive sleep apnea)     Class: Minor  . HTN (hypertension)   . HSV-2 infection   . Vitamin D insufficiency   . Iron (Fe) deficiency anemia      Prior to Admission medications   Medication Sig Start Date End Date Taking? Authorizing Provider  calcium-vitamin D (OSCAL WITH D) 500-200 MG-UNIT tablet Take 1 tablet by mouth 3 (three) times daily.   Yes [provider]  Adalimumab (HUMIRA PEN) 40 MG/0.8ML PNKT Inject 40 mg into the skin every 14 (fourteen) days. 07/06/15   Harrison Mons, PA-C    atorvastatin (LIPITOR) 40 MG tablet Take 1 tablet (40 mg total) by mouth daily. 03/27/17   Loral Campi, PA-C  benzonatate (TESSALON) 200 MG capsule Take 1 capsule (200 mg total) by mouth 2 (two) times daily as needed for cough. Patient not taking: Reported on 09/25/2017 09/10/17   Tereasa Coop, PA-C  Biotin 5 MG CAPS Take by mouth.    [provider]  cetirizine (ZYRTEC) 10 MG tablet Take 10 mg by mouth daily as needed for allergies.     [provider]  cholecalciferol (VITAMIN D) 1000 UNITS tablet Take 2,000 Units by mouth daily.     [provider]  Cyanocobalamin (VITAMIN B-12) 2500 MCG SUBL Place 2,500 mcg under the tongue daily.    [provider]  docusate sodium (COLACE) 100 MG capsule Take 100 mg by mouth as needed.     [provider]  Ferrous Sulfate (FER-IRON PO) Take by mouth.    [provider]  Fish Oil-Krill Oil (KRILL OIL PLUS) CAPS Take 750 mg by mouth daily.    [provider]  iron polysaccharides (FERREX 150) 150 MG capsule TAKE ONE CAPSULE BY MOUTH TWICE DAILY. Patient not taking: Reported on 09/10/2017 12/22/16   Harrison Mons, PA-C  lisinopril (PRINIVIL,ZESTRIL) 20 MG tablet Take 2 tablets (40 mg total) by mouth daily. 03/27/17   Harrison Mons, PA-C  meloxicam (MOBIC) 7.5 MG tablet Take 7.5 mg by mouth as needed for pain.    [provider]  Multiple Vitamin (MULTIVITAMIN WITH MINERALS) TABS  tablet Take 1 tablet by mouth 2 (two) times daily.    [provider]  omeprazole (PRILOSEC) 40 MG capsule Take 1 capsule (40 mg total) by mouth daily. 12/22/16   Harrison Mons, PA-C     Allergies  Allergen Reactions  . Adhesive [Tape] Rash    Skin Peels       Objective:  Physical Exam  Constitutional: She is oriented to person, place, and time. She appears well-developed and well-nourished. She is active and cooperative. No distress.  BP 114/70   Pulse 74   Temp 98.8 F (37.1 C)   Resp  16   Ht 5' 5.75" (1.67 m)   Wt 238 lb 9.6 oz (108.2 kg)   SpO2 97%   BMI 38.80 kg/m   HENT:  Head: Normocephalic and atraumatic.  Right Ear: Hearing normal.  Left Ear: Hearing normal.  Eyes: Conjunctivae are normal. No scleral icterus.  Neck: Normal range of motion. Neck supple. No thyromegaly present.  Cardiovascular: Normal rate, regular rhythm and normal heart sounds.  Pulses:      Radial pulses are 2+ on the right side, and 2+ on the left side.  Pulmonary/Chest: Effort normal and breath sounds normal.  Lymphadenopathy:       Head (right side): No tonsillar, no preauricular, no posterior auricular and no occipital adenopathy present.       Head (left side): No tonsillar, no preauricular, no posterior auricular and no occipital adenopathy present.    She has no cervical adenopathy.       Right: No supraclavicular adenopathy present.       Left: No supraclavicular adenopathy present.  Neurological: She is alert and oriented to person, place, and time. No sensory deficit.  Skin: Skin is warm, dry and intact. No rash noted. No cyanosis or erythema. Nails show no clubbing.  Psychiatric: She has a normal mood and affect. Her speech is normal and behavior is normal.    Wt Readings from Last 3 Encounters:  09/25/17 238 lb 9.6 oz (108.2 kg)  09/10/17 236 lb (107 kg)  03/27/17 223 lb 6.4 oz (101.3 kg)        Assessment & Plan:   Problem List Items Addressed This Visit    Hyperlipidemia - Primary (Chronic)    Continue Krill oil and atorvastatin. Await labs. Adjust regimen as indicated by results.      Relevant Orders   Lipid panel (Completed)   Comprehensive metabolic panel (Completed)   Osteoarthritis (Chronic)    Continue PRN meloxicam.      Relevant Medications   meloxicam (MOBIC) 7.5 MG tablet   GERD (gastroesophageal reflux disease)    Controlled on PPI therapy.      Relevant Medications   omeprazole (PRILOSEC) 40 MG capsule   HTN (hypertension)     Controlled. Continue lisinopril.      Relevant Orders   Comprehensive metabolic panel (Completed)   Iron (Fe) deficiency anemia    Continue Fe supplementation.      Relevant Medications   iron polysaccharides (FERREX 150) 150 MG capsule   Other Relevant Orders   CBC with Differential/Platelet (Completed)   Rheumatoid arthritis ([place])    Continue per rheumatology.      Relevant Medications   meloxicam (MOBIC) 7.5 MG tablet       Return in about 3 months (around 12/25/2017) for annual exam (after 12/22/2017).   Fara Chute, PA-C [facility] [place]

## 2017-09-25 NOTE — Assessment & Plan Note (Signed)
Controlled on PPI therapy.

## 2017-09-25 NOTE — Assessment & Plan Note (Signed)
Continue Fe supplementation

## 2017-09-26 LAB — LIPID PANEL
Chol/HDL Ratio: 2.6 ratio (ref 0.0–4.4)
Cholesterol, Total: 162 mg/dL (ref 100–199)
HDL: 63 mg/dL (ref >39)
LDL CALC: 83 mg/dL (ref 0–99)
TRIGLYCERIDES: 81 mg/dL (ref 0–149)
VLDL CHOLESTEROL CAL: 16 mg/dL (ref 5–40)

## 2017-09-26 LAB — COMPREHENSIVE METABOLIC PANEL
A/G RATIO: 1.4 (ref 1.2–2.2)
ALK PHOS: 69 IU/L (ref 39–117)
ALT: 12 IU/L (ref 0–32)
AST: 19 IU/L (ref 0–40)
Albumin: 3.9 g/dL (ref 3.5–5.5)
BUN/Creatinine Ratio: 16 (ref 9–23)
BUN: 10 mg/dL (ref 6–24)
Bilirubin Total: 0.5 mg/dL (ref 0.0–1.2)
CO2: 26 mmol/L (ref 20–29)
Calcium: 9 mg/dL (ref 8.7–10.2)
Chloride: 105 mmol/L (ref 96–106)
Creatinine, Ser: 0.61 mg/dL (ref 0.57–1.00)
GFR calc Af Amer: 119 mL/min/{1.73_m2} (ref >59)
GFR, EST NON AFRICAN AMERICAN: 103 mL/min/{1.73_m2} (ref >59)
GLOBULIN, TOTAL: 2.8 g/dL (ref 1.5–4.5)
Glucose: 83 mg/dL (ref 65–99)
POTASSIUM: 4.1 mmol/L (ref 3.5–5.2)
SODIUM: 144 mmol/L (ref 134–144)
Total Protein: 6.7 g/dL (ref 6.0–8.5)

## 2017-09-26 LAB — CBC WITH DIFFERENTIAL/PLATELET
BASOS: 1 %
Basophils Absolute: 0 10*3/uL (ref 0.0–0.2)
EOS (ABSOLUTE): 0.1 10*3/uL (ref 0.0–0.4)
Eos: 3 %
Hematocrit: 38.9 % (ref 34.0–46.6)
Hemoglobin: 12.4 g/dL (ref 11.1–15.9)
IMMATURE GRANULOCYTES: 0 %
Immature Grans (Abs): 0 10*3/uL (ref 0.0–0.1)
Lymphocytes Absolute: 1.6 10*3/uL (ref 0.7–3.1)
Lymphs: 32 %
MCH: 27.3 pg (ref 26.6–33.0)
MCHC: 31.9 g/dL (ref 31.5–35.7)
MCV: 86 fL (ref 79–97)
MONOS ABS: 0.6 10*3/uL (ref 0.1–0.9)
Monocytes: 13 %
NEUTROS PCT: 51 %
Neutrophils Absolute: 2.5 10*3/uL (ref 1.4–7.0)
Platelets: 285 10*3/uL (ref 150–379)
RBC: 4.55 x10E6/uL (ref 3.77–5.28)
RDW: 15.8 % — AB (ref 12.3–15.4)
WBC: 4.8 10*3/uL (ref 3.4–10.8)

## 2017-12-25 LAB — HM MAMMOGRAPHY

## 2018-01-03 ENCOUNTER — Encounter: Payer: Self-pay | Admitting: *Deleted

## 2018-06-06 ENCOUNTER — Other Ambulatory Visit: Payer: Self-pay

## 2018-06-06 ENCOUNTER — Ambulatory Visit: Payer: BC Managed Care – PPO | Admitting: Emergency Medicine

## 2018-06-06 ENCOUNTER — Encounter: Payer: Self-pay | Admitting: Emergency Medicine

## 2018-06-06 VITALS — BP 167/87 | HR 67 | Temp 98.2°F | Resp 16 | Ht 65.25 in | Wt 242.4 lb

## 2018-06-06 DIAGNOSIS — D500 Iron deficiency anemia secondary to blood loss (chronic): Secondary | ICD-10-CM | POA: Diagnosis not present

## 2018-06-06 DIAGNOSIS — Z23 Encounter for immunization: Secondary | ICD-10-CM

## 2018-06-06 DIAGNOSIS — I10 Essential (primary) hypertension: Secondary | ICD-10-CM

## 2018-06-06 DIAGNOSIS — M059 Rheumatoid arthritis with rheumatoid factor, unspecified: Secondary | ICD-10-CM

## 2018-06-06 DIAGNOSIS — K219 Gastro-esophageal reflux disease without esophagitis: Secondary | ICD-10-CM

## 2018-06-06 DIAGNOSIS — E785 Hyperlipidemia, unspecified: Secondary | ICD-10-CM

## 2018-06-06 MED ORDER — POLYSACCHARIDE IRON COMPLEX 150 MG PO CAPS
ORAL_CAPSULE | ORAL | 3 refills | Status: DC
Start: 2018-06-06 — End: 2019-07-07

## 2018-06-06 MED ORDER — ATORVASTATIN CALCIUM 40 MG PO TABS: 40.0000 mg | ORAL_TABLET | Freq: Every day | ORAL | 3 refills | Status: DC

## 2018-06-06 MED ORDER — OMEPRAZOLE 40 MG PO CPDR: 40.0000 mg | DELAYED_RELEASE_CAPSULE | Freq: Every day | ORAL | 3 refills | Status: DC

## 2018-06-06 MED ORDER — LISINOPRIL 20 MG PO TABS: 40.0000 mg | ORAL_TABLET | Freq: Every day | ORAL | 3 refills | Status: DC

## 2018-06-06 NOTE — Progress Notes (Signed)
Laurie Hodge 57 y.o.   Chief Complaint  Patient presents with  . Medication Refill    see RFs  . Establish Care    HISTORY OF PRESENT ILLNESS: This is a 57 y.o. female by disease with history of rheumatoid arthritis, hypertension, dyslipidemia, here to start with me.  Former patient of [place].  Also needs medication refills.  Has no complaints or medical concerns today.  Needs refills of Lipitor, lisinopril, omeprazole for as needed use, and polysaccharide iron complex.  HPI   Prior to Admission medications   Medication Sig Start Date End Date Taking? Authorizing Provider  Adalimumab (HUMIRA PEN) 40 MG/0.8ML PNKT Inject 40 mg into the skin every 14 (fourteen) days. 07/06/15  Yes Jeffery, Chelle, PA  atorvastatin (LIPITOR) 40 MG tablet Take 1 tablet (40 mg total) by mouth daily. 03/27/17  Yes Jeffery, Chelle, PA  Biotin 5 MG CAPS Take by mouth.   Yes [provider]  calcium-vitamin D (OSCAL WITH D) 500-200 MG-UNIT tablet Take 1 tablet by mouth 3 (three) times daily.   Yes [provider]  cetirizine (ZYRTEC) 10 MG tablet Take 10 mg by mouth daily as needed for allergies.    Yes [provider]  cholecalciferol (VITAMIN D) 1000 UNITS tablet Take 2,000 Units by mouth daily.    Yes [provider]  Cyanocobalamin (VITAMIN B-12) 2500 MCG SUBL Place 2,500 mcg under the tongue daily.   Yes [provider]  docusate sodium (COLACE) 100 MG capsule Take 100 mg by mouth as needed.    Yes [provider]  Fish Oil-Krill Oil (KRILL OIL PLUS) CAPS Take 750 mg by mouth daily.   Yes [provider]  iron polysaccharides (FERREX 150) 150 MG capsule TAKE ONE CAPSULE BY MOUTH TWICE DAILY. 09/25/17  Yes Jeffery, Chelle, PA  lisinopril (PRINIVIL,ZESTRIL) 20 MG tablet Take 2 tablets (40 mg total) by mouth daily. 03/27/17  Yes Jeffery, Chelle, PA  meloxicam (MOBIC) 7.5 MG tablet Take 1 tablet (7.5 mg total) by mouth as needed for pain. 09/25/17   Yes Harrison Mons, PA  Multiple Vitamin (MULTIVITAMIN WITH MINERALS) TABS tablet Take 1 tablet by mouth 2 (two) times daily.   Yes [provider]  omeprazole (PRILOSEC) 40 MG capsule Take 1 capsule (40 mg total) by mouth daily. 09/25/17  Yes Jeffery, Domingo Mend, PA  senna (SENOKOT) 8.6 MG TABS tablet Take 2 tablets by mouth as needed for mild constipation. Reported on 10/12/2015   Yes [provider]    Allergies  Allergen Reactions  . Adhesive [Tape] Rash    Skin Peels    Patient Active Problem List   Diagnosis Date Noted  . Osteoarthritis 02/05/2015  . Hyperlipidemia 11/06/2013  . Rheumatoid arthritis ([place]) 06/10/2013  . BMI 36.0-36.9,adult   . GERD (gastroesophageal reflux disease)   . OSA (obstructive sleep apnea)     Class: Minor  . HTN (hypertension)   . HSV-2 infection   . Vitamin D insufficiency   . Iron (Fe) deficiency anemia     Past Medical History:  Diagnosis Date  . Allergy   . Arthritis    RA  . Depression   . GERD (gastroesophageal reflux disease)   . Hemorrhoid   . HSV-2 infection    IgG  . HTN (hypertension)   . Hyperlipidemia   . Iron (Fe) deficiency anemia   . Obesity, Class III, BMI 40-49.9 (morbid obesity) ([place])   . OSA on CPAP    cpap broke this  week  . OSA on CPAP   . Ulcer    gastric - h. pylori positive  . Vitamin D insufficiency     Past Surgical History:  Procedure Laterality Date  . LAPAROSCOPIC GASTRIC SLEEVE RESECTION N/A 05/31/2015   Procedure: LAPAROSCOPIC GASTRIC SLEEVE RESECTION;  Surgeon: Arta Bruce Kinsinger, MD;  Location: [facility];  Service: General;  Laterality: N/A;  . TUBAL LIGATION      Social History   Socioeconomic History  . Marital status: Divorced    Spouse name: n/a  . Number of children: 3  . Years of education: 19  . Highest education level: Not on file  Occupational History  . 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 Financial resource strain: Not on file  . Food insecurity:    Worry: Not on file    Inability: Not on file  . Transportation needs:    Medical: Not on file    Non-medical: Not on file  Tobacco Use  . Smoking status: Never Smoker  . Smokeless tobacco: Never Used  Substance and Sexual Activity  . Alcohol use: Yes    Alcohol/week: 0.0 standard drinks    Comment: 2-3 times a year at special events  . Drug use: No  . Sexual activity: Not Currently    Partners: Male    Comment: not active since divorce from husband 2010  Lifestyle  . Physical activity:    Days per week: Not on file    Minutes per session: Not on file  . Stress: Not on file  Relationships  . Social connections:    Talks on phone: Not on file    Gets together: Not on file    Attends religious service: Not on file    Active member of club or organization: Not on file    Attends meetings of clubs or organizations: Not on file    Relationship status: Not on file  . Intimate partner violence:    Fear of current or ex partner: Not on file    Emotionally abused: Not on file    Physically abused: Not on file    Forced sexual activity: Not on file  Other Topics Concern  . Not on file  Social History Narrative   Lives with daughter. Her mother who has dementia and had a stroke lives with her during the week and her siblings take turns staying with their mother on the weekends.      Denies caffeine use     Family History  Problem Relation Age of Onset  . Diabetes Mother   . Hypertension Mother   . Dementia Mother   . Stroke Mother 34  . Hyperlipidemia Mother   . Cancer Paternal Aunt        breast ca x 5 maternal aunts  . Diabetes Sister   . Heart disease Sister   . Hyperlipidemia Sister   . Hypertension Sister   . Heart disease Father   . Hyperlipidemia Father   . Hypertension Father   . Stroke Father   . Heart disease Sister   . Hyperlipidemia Sister   . Hypertension Sister   .  Diabetes Brother   . Hyperlipidemia Brother   . Hypertension Brother   . Hypertension Son   . Colon cancer Neg Hx   . Esophageal cancer Neg Hx   . Rectal cancer Neg Hx   . Stomach cancer  Neg Hx      Review of Systems  Constitutional: Negative.  Negative for chills and fever.  HENT: Negative.   Eyes: Negative.  Negative for blurred vision and double vision.  Respiratory: Negative.  Negative for cough and shortness of breath.   Cardiovascular: Negative.  Negative for chest pain and palpitations.  Gastrointestinal: Negative.  Negative for abdominal pain, diarrhea, nausea and vomiting.  Genitourinary: Negative.  Negative for dysuria and hematuria.  Musculoskeletal: Positive for back pain and joint pain.  Skin: Negative.  Negative for rash.  Neurological: Negative.  Negative for dizziness and headaches.  Endo/Heme/Allergies: Negative.   All other systems reviewed and are negative.  Vitals:   06/06/18 1513  BP: (!) 167/87  Pulse: 67  Resp: 16  Temp: 98.2 F (36.8 C)  SpO2: 99%     Physical Exam Vitals signs reviewed.  Constitutional:      Appearance: Normal appearance. She is obese.  HENT:     Head: Normocephalic and atraumatic.     Mouth/Throat:     Mouth: Mucous membranes are moist.     Pharynx: Oropharynx is clear.  Eyes:     Extraocular Movements: Extraocular movements intact.     Conjunctiva/sclera: Conjunctivae normal.     Pupils: Pupils are equal, round, and reactive to light.  Neck:     Musculoskeletal: Normal range of motion and neck supple.  Cardiovascular:     Rate and Rhythm: Normal rate and regular rhythm.     Heart sounds: Normal heart sounds.  Pulmonary:     Effort: Pulmonary effort is normal.     Breath sounds: Normal breath sounds.  Musculoskeletal: Normal range of motion.  Skin:    General: Skin is warm and dry.     Capillary Refill: Capillary refill takes less than 2 seconds.  Neurological:     General: No focal deficit present.     Mental  Status: She is alert and oriented to person, place, and time.  Psychiatric:        Mood and Affect: Mood normal.        Behavior: Behavior normal.    A total of 25 minutes was spent in the room with the patient, greater than 50% of which was in counseling/coordination of care regarding chronic medical problems, medications, chart review, blood work reviewed, and need for follow-up.   ASSESSMENT & PLAN: Emeline was seen today for medication refill and establish care.  Diagnoses and all orders for this visit:  Essential hypertension -     lisinopril (PRINIVIL,ZESTRIL) 20 MG tablet; Take 2 tablets (40 mg total) by mouth daily.  Hyperlipidemia, unspecified hyperlipidemia type -     atorvastatin (LIPITOR) 40 MG tablet; Take 1 tablet (40 mg total) by mouth daily.  Gastroesophageal reflux disease, esophagitis presence not specified -     omeprazole (PRILOSEC) 40 MG capsule; Take 1 capsule (40 mg total) by mouth daily.  Iron deficiency anemia due to chronic blood loss -     iron polysaccharides (FERREX 150) 150 MG capsule; TAKE ONE CAPSULE BY MOUTH TWICE DAILY.  Rheumatoid arthritis with positive rheumatoid factor, involving unspecified site [facility])  Need for prophylactic vaccination and inoculation against influenza -     Flu Vaccine QUAD 36+ mos IM    Patient Instructions       If you have lab work done today you will be contacted with your lab results within the next 2 weeks.  If you have not heard from [place] then please contact  us. The fastest way to get your results is to register for My Chart.   IF you received an x-ray today, you will receive an invoice from [facility]. Please contact [facility] at [phone] with questions or concerns regarding your invoice.   IF you received labwork today, you will receive an invoice from [facility]. Please contact [facility] at [phone] with questions or concerns regarding your invoice.   Our billing staff will not be  able to assist you with questions regarding bills from these companies.  You will be contacted with the lab results as soon as they are available. The fastest way to get your results is to activate your My Chart account. Instructions are located on the last page of this paperwork. If you have not heard from [place] regarding the results in 2 weeks, please contact this office.     Hypertension Hypertension, commonly called high blood pressure, is when the force of blood pumping through the arteries is too strong. The arteries are the blood vessels that carry blood from the heart throughout the body. Hypertension forces the heart to work harder to pump blood and may cause arteries to become narrow or stiff. Having untreated or uncontrolled hypertension can cause heart attacks, strokes, kidney disease, and other problems. A blood pressure reading consists of a higher number over a lower number. Ideally, your blood pressure should be below 120/80. The first ("top") number is called the systolic pressure. It is a measure of the pressure in your arteries as your heart beats. The second ("bottom") number is called the diastolic pressure. It is a measure of the pressure in your arteries as the heart relaxes. What are the causes? The cause of this condition is not known. What increases the risk? Some risk factors for high blood pressure are under your control. Others are not. Factors you can change  Smoking.  Having type 2 diabetes mellitus, high cholesterol, or both.  Not getting enough exercise or physical activity.  Being overweight.  Having too much fat, sugar, calories, or salt (sodium) in your diet.  Drinking too much alcohol. Factors that are difficult or impossible to change  Having chronic kidney disease.  Having a family history of high blood pressure.  Age. Risk increases with age.  Race. You may be at higher risk if you are African-American.  Gender. Men are at higher risk than  women before age 56. After age 52, women are at higher risk than men.  Having obstructive sleep apnea.  Stress. What are the signs or symptoms? Extremely high blood pressure (hypertensive crisis) may cause:  Headache.  Anxiety.  Shortness of breath.  Nosebleed.  Nausea and vomiting.  Severe chest pain.  Jerky movements you cannot control (seizures). How is this diagnosed? This condition is diagnosed by measuring your blood pressure while you are seated, with your arm resting on a surface. The cuff of the blood pressure monitor will be placed directly against the skin of your upper arm at the level of your heart. It should be measured at least twice using the same arm. Certain conditions can cause a difference in blood pressure between your right and left arms. Certain factors can cause blood pressure readings to be lower or higher than normal (elevated) for a short period of time:  When your blood pressure is higher when you are in a health care provider's office than when you are at home, this is called white coat hypertension. Most people with this condition do not need  medicines.  When your blood pressure is higher at home than when you are in a health care provider's office, this is called masked hypertension. Most people with this condition may need medicines to control blood pressure. If you have a high blood pressure reading during one visit or you have normal blood pressure with other risk factors:  You may be asked to return on a different day to have your blood pressure checked again.  You may be asked to monitor your blood pressure at home for 1 week or longer. If you are diagnosed with hypertension, you may have other blood or imaging tests to help your health care provider understand your overall risk for other conditions. How is this treated? This condition is treated by making healthy lifestyle changes, such as eating healthy foods, exercising more, and reducing your  alcohol intake. Your health care provider may prescribe medicine if lifestyle changes are not enough to get your blood pressure under control, and if:  Your systolic blood pressure is above 130.  Your diastolic blood pressure is above 80. Your personal target blood pressure may vary depending on your medical conditions, your age, and other factors. Follow these instructions at home: Eating and drinking   Eat a diet that is high in fiber and potassium, and low in sodium, added sugar, and fat. An example eating plan is called the DASH (Dietary Approaches to Stop Hypertension) diet. To eat this way: ? Eat plenty of fresh fruits and vegetables. Try to fill half of your plate at each meal with fruits and vegetables. ? Eat whole grains, such as whole wheat pasta, [place] rice, or whole grain bread. Fill about one quarter of your plate with whole grains. ? Eat or drink low-fat dairy products, such as skim milk or low-fat yogurt. ? Avoid fatty cuts of meat, processed or cured meats, and poultry with skin. Fill about one quarter of your plate with lean proteins, such as fish, chicken without skin, beans, eggs, and tofu. ? Avoid premade and processed foods. These tend to be higher in sodium, added sugar, and fat.  Reduce your daily sodium intake. Most people with hypertension should eat less than 1,500 mg of sodium a day.  Limit alcohol intake to no more than 1 drink a day for nonpregnant women and 2 drinks a day for men. One drink equals 12 oz of beer, 5 oz of wine, or 1 oz of hard liquor. Lifestyle   Work with your health care provider to maintain a healthy body weight or to lose weight. Ask what an ideal weight is for you.  Get at least 30 minutes of exercise that causes your heart to beat faster (aerobic exercise) most days of the week. Activities may include walking, swimming, or biking.  Include exercise to strengthen your muscles (resistance exercise), such as pilates or lifting weights, as  part of your weekly exercise routine. Try to do these types of exercises for 30 minutes at least 3 days a week.  Do not use any products that contain nicotine or tobacco, such as cigarettes and e-cigarettes. If you need help quitting, ask your health care provider.  Monitor your blood pressure at home as told by your health care provider.  Keep all follow-up visits as told by your health care provider. This is important. Medicines  Take over-the-counter and prescription medicines only as told by your health care provider. Follow directions carefully. Blood pressure medicines must be taken as prescribed.  Do not skip doses of  blood pressure medicine. Doing this puts you at risk for problems and can make the medicine less effective.  Ask your health care provider about side effects or reactions to medicines that you should watch for. Contact a health care provider if:  You think you are having a reaction to a medicine you are taking.  You have headaches that keep coming back (recurring).  You feel dizzy.  You have swelling in your ankles.  You have trouble with your vision. Get help right away if:  You develop a severe headache or confusion.  You have unusual weakness or numbness.  You feel faint.  You have severe pain in your chest or abdomen.  You vomit repeatedly.  You have trouble breathing. Summary  Hypertension is when the force of blood pumping through your arteries is too strong. If this condition is not controlled, it may put you at risk for serious complications.  Your personal target blood pressure may vary depending on your medical conditions, your age, and other factors. For most people, a normal blood pressure is less than 120/80.  Hypertension is treated with lifestyle changes, medicines, or a combination of both. Lifestyle changes include weight loss, eating a healthy, low-sodium diet, exercising more, and limiting alcohol. This information is not intended  to replace advice given to you by your health care provider. Make sure you discuss any questions you have with your health care provider. Document Released: 06/05/2005 Document Revised: 05/03/2016 Document Reviewed: 05/03/2016 Elsevier Interactive Patient Education  2019 [employer]      Agustina Caroli, MD [facility]

## 2018-06-06 NOTE — Patient Instructions (Addendum)
   If you have lab work done today you will be contacted with your lab results within the next 2 weeks.  If you have not heard from us then please contact us. The fastest way to get your results is to register for My Chart.   IF you received an x-ray today, you will receive an invoice from [facility]. Please contact [facility] at [phone] with questions or concerns regarding your invoice.   IF you received labwork today, you will receive an invoice from [facility]. Please contact [facility] at [phone] with questions or concerns regarding your invoice.   Our billing staff will not be able to assist you with questions regarding bills from these companies.  You will be contacted with the lab results as soon as they are available. The fastest way to get your results is to activate your My Chart account. Instructions are located on the last page of this paperwork. If you have not heard from us regarding the results in 2 weeks, please contact this office.       Hypertension Hypertension, commonly called high blood pressure, is when the force of blood pumping through the arteries is too strong. The arteries are the blood vessels that carry blood from the heart throughout the body. Hypertension forces the heart to work harder to pump blood and may cause arteries to become narrow or stiff. Having untreated or uncontrolled hypertension can cause heart attacks, strokes, kidney disease, and other problems. A blood pressure reading consists of a higher number over a lower number. Ideally, your blood pressure should be below 120/80. The first ("top") number is called the systolic pressure. It is a measure of the pressure in your arteries as your heart beats. The second ("bottom") number is called the diastolic pressure. It is a measure of the pressure in your arteries as the heart relaxes. What are the causes? The cause of this condition is not known. What increases the  risk? Some risk factors for high blood pressure are under your control. Others are not. Factors you can change  Smoking.  Having type 2 diabetes mellitus, high cholesterol, or both.  Not getting enough exercise or physical activity.  Being overweight.  Having too much fat, sugar, calories, or salt (sodium) in your diet.  Drinking too much alcohol. Factors that are difficult or impossible to change  Having chronic kidney disease.  Having a family history of high blood pressure.  Age. Risk increases with age.  Race. You may be at higher risk if you are African-American.  Gender. Men are at higher risk than women before age 45. After age 65, women are at higher risk than men.  Having obstructive sleep apnea.  Stress. What are the signs or symptoms? Extremely high blood pressure (hypertensive crisis) may cause:  Headache.  Anxiety.  Shortness of breath.  Nosebleed.  Nausea and vomiting.  Severe chest pain.  Jerky movements you cannot control (seizures). How is this diagnosed? This condition is diagnosed by measuring your blood pressure while you are seated, with your arm resting on a surface. The cuff of the blood pressure monitor will be placed directly against the skin of your upper arm at the level of your heart. It should be measured at least twice using the same arm. Certain conditions can cause a difference in blood pressure between your right and left arms. Certain factors can cause blood pressure readings to be lower or higher than normal (elevated) for a short period of time:    When your blood pressure is higher when you are in a health care provider's office than when you are at home, this is called white coat hypertension. Most people with this condition do not need medicines.  When your blood pressure is higher at home than when you are in a health care provider's office, this is called masked hypertension. Most people with this condition may need medicines  to control blood pressure. If you have a high blood pressure reading during one visit or you have normal blood pressure with other risk factors:  You may be asked to return on a different day to have your blood pressure checked again.  You may be asked to monitor your blood pressure at home for 1 week or longer. If you are diagnosed with hypertension, you may have other blood or imaging tests to help your health care provider understand your overall risk for other conditions. How is this treated? This condition is treated by making healthy lifestyle changes, such as eating healthy foods, exercising more, and reducing your alcohol intake. Your health care provider may prescribe medicine if lifestyle changes are not enough to get your blood pressure under control, and if:  Your systolic blood pressure is above 130.  Your diastolic blood pressure is above 80. Your personal target blood pressure may vary depending on your medical conditions, your age, and other factors. Follow these instructions at home: Eating and drinking   Eat a diet that is high in fiber and potassium, and low in sodium, added sugar, and fat. An example eating plan is called the DASH (Dietary Approaches to Stop Hypertension) diet. To eat this way: ? Eat plenty of fresh fruits and vegetables. Try to fill half of your plate at each meal with fruits and vegetables. ? Eat whole grains, such as whole wheat pasta, Quintela rice, or whole grain bread. Fill about one quarter of your plate with whole grains. ? Eat or drink low-fat dairy products, such as skim milk or low-fat yogurt. ? Avoid fatty cuts of meat, processed or cured meats, and poultry with skin. Fill about one quarter of your plate with lean proteins, such as fish, chicken without skin, beans, eggs, and tofu. ? Avoid premade and processed foods. These tend to be higher in sodium, added sugar, and fat.  Reduce your daily sodium intake. Most people with hypertension should  eat less than 1,500 mg of sodium a day.  Limit alcohol intake to no more than 1 drink a day for nonpregnant women and 2 drinks a day for men. One drink equals 12 oz of beer, 5 oz of wine, or 1 oz of hard liquor. Lifestyle   Work with your health care provider to maintain a healthy body weight or to lose weight. Ask what an ideal weight is for you.  Get at least 30 minutes of exercise that causes your heart to beat faster (aerobic exercise) most days of the week. Activities may include walking, swimming, or biking.  Include exercise to strengthen your muscles (resistance exercise), such as pilates or lifting weights, as part of your weekly exercise routine. Try to do these types of exercises for 30 minutes at least 3 days a week.  Do not use any products that contain nicotine or tobacco, such as cigarettes and e-cigarettes. If you need help quitting, ask your health care provider.  Monitor your blood pressure at home as told by your health care provider.  Keep all follow-up visits as told by your health care provider.   This is important. Medicines  Take over-the-counter and prescription medicines only as told by your health care provider. Follow directions carefully. Blood pressure medicines must be taken as prescribed.  Do not skip doses of blood pressure medicine. Doing this puts you at risk for problems and can make the medicine less effective.  Ask your health care provider about side effects or reactions to medicines that you should watch for. Contact a health care provider if:  You think you are having a reaction to a medicine you are taking.  You have headaches that keep coming back (recurring).  You feel dizzy.  You have swelling in your ankles.  You have trouble with your vision. Get help right away if:  You develop a severe headache or confusion.  You have unusual weakness or numbness.  You feel faint.  You have severe pain in your chest or abdomen.  You vomit  repeatedly.  You have trouble breathing. Summary  Hypertension is when the force of blood pumping through your arteries is too strong. If this condition is not controlled, it may put you at risk for serious complications.  Your personal target blood pressure may vary depending on your medical conditions, your age, and other factors. For most people, a normal blood pressure is less than 120/80.  Hypertension is treated with lifestyle changes, medicines, or a combination of both. Lifestyle changes include weight loss, eating a healthy, low-sodium diet, exercising more, and limiting alcohol. This information is not intended to replace advice given to you by your health care provider. Make sure you discuss any questions you have with your health care provider. Document Released: 06/05/2005 Document Revised: 05/03/2016 Document Reviewed: 05/03/2016 [employer] Interactive Patient Education  2019 [employer]  

## 2018-12-04 ENCOUNTER — Ambulatory Visit: Payer: BC Managed Care – PPO | Admitting: Emergency Medicine

## 2018-12-09 ENCOUNTER — Ambulatory Visit: Payer: BC Managed Care – PPO | Admitting: Emergency Medicine

## 2019-01-08 ENCOUNTER — Ambulatory Visit (INDEPENDENT_AMBULATORY_CARE_PROVIDER_SITE_OTHER): Payer: BC Managed Care – PPO | Admitting: Emergency Medicine

## 2019-01-08 ENCOUNTER — Other Ambulatory Visit: Payer: Self-pay

## 2019-01-08 ENCOUNTER — Encounter: Payer: Self-pay | Admitting: Emergency Medicine

## 2019-01-08 VITALS — BP 168/90 | HR 68 | Temp 98.6°F | Resp 16 | Wt 260.0 lb

## 2019-01-08 DIAGNOSIS — I10 Essential (primary) hypertension: Secondary | ICD-10-CM | POA: Diagnosis not present

## 2019-01-08 DIAGNOSIS — M059 Rheumatoid arthritis with rheumatoid factor, unspecified: Secondary | ICD-10-CM

## 2019-01-08 DIAGNOSIS — Z0001 Encounter for general adult medical examination with abnormal findings: Secondary | ICD-10-CM

## 2019-01-08 DIAGNOSIS — E785 Hyperlipidemia, unspecified: Secondary | ICD-10-CM | POA: Diagnosis not present

## 2019-01-08 NOTE — Progress Notes (Signed)
BP Readings from Last 3 Encounters:  06/06/18 (!) 167/87  09/25/17 114/70  09/10/17 126/80   Laurie Hodge 58 y.o.   Chief Complaint  Patient presents with  . Annual Exam    HISTORY OF PRESENT ILLNESS: This is a 58 y.o. female here for her annual exam.  Patient has the following chronic medical problems: 1.  Rheumatoid arthritis: Sees rheumatologist on a regular basis.  Failed methotrexate, intolerant to it.  Presently on Humira.  Diffuse arthralgias not well controlled.  Also complaining of generalized fatigue and tiredness. 2.  Essential hypertension: On lisinopril 40 mg a day. 3.  Dyslipidemia: On Lipitor 40 mg a day. 4.  GERD: On omeprazole. 5.  Sleep apnea   HPI   Prior to Admission medications   Medication Sig Start Date End Date Taking? Authorizing Provider  Adalimumab (HUMIRA PEN) 40 MG/0.8ML PNKT Inject 40 mg into the skin every 14 (fourteen) days. 07/06/15  Yes Jeffery, Chelle, PA  atorvastatin (LIPITOR) 40 MG tablet Take 1 tablet (40 mg total) by mouth daily. 06/06/18  Yes SagardiaInes Bloomer, MD  Biotin 5 MG CAPS Take by mouth.   Yes [provider]  calcium-vitamin D (OSCAL WITH D) 500-200 MG-UNIT tablet Take 1 tablet by mouth 3 (three) times daily.   Yes [provider]  cetirizine (ZYRTEC) 10 MG tablet Take 10 mg by mouth daily as needed for allergies.    Yes [provider]  cholecalciferol (VITAMIN D) 1000 UNITS tablet Take 2,000 Units by mouth daily.    Yes [provider]  Cyanocobalamin (VITAMIN B-12) 2500 MCG SUBL Place 2,500 mcg under the tongue daily.   Yes [provider]  docusate sodium (COLACE) 100 MG capsule Take 100 mg by mouth as needed.    Yes [provider]  Fish Oil-Krill Oil (KRILL OIL PLUS) CAPS Take 750 mg by mouth daily.   Yes [provider]  iron polysaccharides (FERREX 150) 150 MG capsule TAKE ONE CAPSULE BY MOUTH TWICE DAILY. 06/06/18  Yes Akito Boomhower, [name], MD   lisinopril (PRINIVIL,ZESTRIL) 20 MG tablet Take 2 tablets (40 mg total) by mouth daily. 06/06/18  Yes Shinita Mac, [name], MD  meloxicam (MOBIC) 7.5 MG tablet Take 1 tablet (7.5 mg total) by mouth as needed for pain. 09/25/17  Yes Harrison Mons, PA  Multiple Vitamin (MULTIVITAMIN WITH MINERALS) TABS tablet Take 1 tablet by mouth 2 (two) times daily.   Yes [provider]  omeprazole (PRILOSEC) 40 MG capsule Take 1 capsule (40 mg total) by mouth daily. Patient not taking: Reported on 01/08/2019 06/06/18   Horald Pollen, MD  senna (SENOKOT) 8.6 MG TABS tablet Take 2 tablets by mouth as needed for mild constipation. Reported on 10/12/2015    [provider]    Allergies  Allergen Reactions  . Adhesive [Tape] Rash    Skin Peels    Patient Active Problem List   Diagnosis Date Noted  . Osteoarthritis 02/05/2015  . Hyperlipidemia 11/06/2013  . Rheumatoid arthritis ([place]) 06/10/2013  . BMI 36.0-36.9,adult   . GERD (gastroesophageal reflux disease)   . OSA (obstructive sleep apnea)     Class: Minor  . HTN (hypertension)   . Vitamin D insufficiency   . Iron (Fe) deficiency anemia     Past Medical History:  Diagnosis Date  . Allergy   . Arthritis    RA  . Depression   . GERD (gastroesophageal reflux disease)   . Hemorrhoid   . HSV-2 infection  IgG  . HTN (hypertension)   . Hyperlipidemia   . Iron (Fe) deficiency anemia   . Obesity, Class III, BMI 40-49.9 (morbid obesity) ([place])   . OSA on CPAP    cpap broke this week  . OSA on CPAP   . Ulcer    gastric - h. pylori positive  . Vitamin D insufficiency     Past Surgical History:  Procedure Laterality Date  . LAPAROSCOPIC GASTRIC SLEEVE RESECTION N/A 05/31/2015   Procedure: LAPAROSCOPIC GASTRIC SLEEVE RESECTION;  Surgeon: Arta Bruce Kinsinger, MD;  Location: [facility];  Service: General;  Laterality: N/A;  . TUBAL LIGATION      Social History   Socioeconomic History  . Marital status: Divorced     Spouse name: n/a  . Number of children: 3  . Years of education: 77  . Highest education level: Not on file  Occupational History  . 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 Financial resource strain: Not on file  . Food insecurity    Worry: Not on file    Inability: Not on file  . Transportation needs    Medical: Not on file    Non-medical: Not on file  Tobacco Use  . Smoking status: Never Smoker  . Smokeless tobacco: Never Used  Substance and Sexual Activity  . Alcohol use: Yes    Alcohol/week: 0.0 standard drinks    Comment: 2-3 times a year at special events  . Drug use: No  . Sexual activity: Not Currently    Partners: Male    Comment: not active since divorce from husband 2010  Lifestyle  . Physical activity    Days per week: Not on file    Minutes per session: Not on file  . Stress: Not on file  Relationships  . Social Herbalist on phone: Not on file    Gets together: Not on file    Attends religious service: Not on file    Active member of club or organization: Not on file    Attends meetings of clubs or organizations: Not on file    Relationship status: Not on file  . Intimate partner violence    Fear of current or ex partner: Not on file    Emotionally abused: Not on file    Physically abused: Not on file    Forced sexual activity: Not on file  Other Topics Concern  . Not on file  Social History Narrative   Lives with daughter. Her mother who has dementia and had a stroke lives with her during the week and her siblings take turns staying with their mother on the weekends.      Denies caffeine use     Family History  Problem Relation Age of Onset  . Diabetes Mother   . Hypertension Mother   . Dementia Mother   . Stroke Mother 41  . Hyperlipidemia Mother   . Cancer Paternal Aunt        breast ca x 5 maternal aunts  . Diabetes Sister   . Heart disease  Sister   . Hyperlipidemia Sister   . Hypertension Sister   . Heart disease Father   . Hyperlipidemia Father   . Hypertension Father   . Stroke Father   . Heart disease Sister   . Hyperlipidemia Sister   . Hypertension Sister   . Diabetes Brother   .  Hyperlipidemia Brother   . Hypertension Brother   . Hypertension Son   . Colon cancer Neg Hx   . Esophageal cancer Neg Hx   . Rectal cancer Neg Hx   . Stomach cancer Neg Hx      Review of Systems  Constitutional: Positive for malaise/fatigue. Negative for chills and fever.  HENT: Negative.  Negative for sore throat.   Eyes: Negative.  Negative for blurred vision and double vision.  Respiratory: Negative.  Negative for cough and shortness of breath.   Cardiovascular: Negative.  Negative for chest pain and palpitations.  Gastrointestinal: Negative.  Negative for abdominal pain, diarrhea, nausea and vomiting.  Genitourinary: Negative.  Negative for dysuria and hematuria.  Musculoskeletal: Positive for joint pain. Negative for back pain, myalgias and neck pain.  Skin: Negative.   Neurological: Positive for weakness. Negative for dizziness, sensory change, focal weakness and headaches.  All other systems reviewed and are negative.  Vitals:   01/08/19 1353  BP: (!) 168/90  Pulse: 68  Resp: 16  Temp: 98.6 F (37 C)  SpO2: 97%     Physical Exam Vitals signs reviewed.  Constitutional:      Appearance: Normal appearance. She is obese.  HENT:     Head: Normocephalic and atraumatic.     Mouth/Throat:     Mouth: Mucous membranes are moist.     Pharynx: Oropharynx is clear.  Eyes:     Extraocular Movements: Extraocular movements intact.     Conjunctiva/sclera: Conjunctivae normal.     Pupils: Pupils are equal, round, and reactive to light.  Neck:     Musculoskeletal: Normal range of motion and neck supple.  Cardiovascular:     Rate and Rhythm: Normal rate and regular rhythm.     Pulses: Normal pulses.     Heart sounds:  Normal heart sounds.  Pulmonary:     Effort: Pulmonary effort is normal.     Breath sounds: Normal breath sounds.  Musculoskeletal: Normal range of motion.  Skin:    General: Skin is warm and dry.     Capillary Refill: Capillary refill takes less than 2 seconds.  Neurological:     General: No focal deficit present.     Mental Status: She is alert and oriented to person, place, and time.  Psychiatric:        Mood and Affect: Mood normal.        Behavior: Behavior normal.      ASSESSMENT & PLAN: Laurie Hodge was seen today for annual exam.  Diagnoses and all orders for this visit:  Encounter for general adult medical examination with abnormal findings  Hyperlipidemia, unspecified hyperlipidemia type -     Hemoglobin A1c -     Lipid panel  Essential hypertension -     CBC with Differential/Platelet -     Comprehensive metabolic panel  Rheumatoid arthritis with positive rheumatoid factor, involving unspecified site ([place]) -     CBC with Differential/Platelet    Patient Instructions       If you have lab work done today you will be contacted with your lab results within the next 2 weeks.  If you have not heard from [place] then please contact us. The fastest way to get your results is to register for My Chart.   IF you received an x-ray today, you will receive an invoice from [facility]. Please contact [facility] at [phone] with questions or concerns regarding your invoice.   IF you received labwork today, you  will receive an invoice from [place]. Please contact [facility] at [phone] with questions or concerns regarding your invoice.   Our billing staff will not be able to assist you with questions regarding bills from these companies.  You will be contacted with the lab results as soon as they are available. The fastest way to get your results is to activate your My Chart account. Instructions are located on the last page of this paperwork. If you  have not heard from [place] regarding the results in 2 weeks, please contact this office.      Health Maintenance, Female Adopting a healthy lifestyle and getting preventive care are important in promoting health and wellness. Ask your health care provider about:  The right schedule for you to have regular tests and exams.  Things you can do on your own to prevent diseases and keep yourself healthy. What should I know about diet, weight, and exercise? Eat a healthy diet   Eat a diet that includes plenty of vegetables, fruits, low-fat dairy products, and lean protein.  Do not eat a lot of foods that are high in solid fats, added sugars, or sodium. Maintain a healthy weight Body mass index (BMI) is used to identify weight problems. It estimates body fat based on height and weight. Your health care provider can help determine your BMI and help you achieve or maintain a healthy weight. Get regular exercise Get regular exercise. This is one of the most important things you can do for your health. Most adults should:  Exercise for at least 150 minutes each week. The exercise should increase your heart rate and make you sweat (moderate-intensity exercise).  Do strengthening exercises at least twice a week. This is in addition to the moderate-intensity exercise.  Spend less time sitting. Even light physical activity can be beneficial. Watch cholesterol and blood lipids Have your blood tested for lipids and cholesterol at 58 years of age, then have this test every 5 years. Have your cholesterol levels checked more often if:  Your lipid or cholesterol levels are high.  You are older than 56 years of age.  You are at high risk for heart disease. What should I know about cancer screening? Depending on your health history and family history, you may need to have cancer screening at various ages. This may include screening for:  Breast cancer.  Cervical cancer.  Colorectal cancer.  Skin  cancer.  Lung cancer. What should I know about heart disease, diabetes, and high blood pressure? Blood pressure and heart disease  High blood pressure causes heart disease and increases the risk of stroke. This is more likely to develop in people who have high blood pressure readings, are of African descent, or are overweight.  Have your blood pressure checked: ? Every 3-5 years if you are 67-51 years of age. ? Every year if you are 33 years old or older. Diabetes Have regular diabetes screenings. This checks your fasting blood sugar level. Have the screening done:  Once every three years after age 71 if you are at a normal weight and have a low risk for diabetes.  More often and at a younger age if you are overweight or have a high risk for diabetes. What should I know about preventing infection? Hepatitis B If you have a higher risk for hepatitis B, you should be screened for this virus. Talk with your health care provider to find out if you are at risk for hepatitis B infection. Hepatitis C Testing  is recommended for:  Everyone born from [birth year] through 1963.  Anyone with known risk factors for hepatitis C. Sexually transmitted infections (STIs)  Get screened for STIs, including gonorrhea and chlamydia, if: ? You are sexually active and are younger than 56 years of age. ? You are older than 56 years of age and your health care provider tells you that you are at risk for this type of infection. ? Your sexual activity has changed since you were last screened, and you are at increased risk for chlamydia or gonorrhea. Ask your health care provider if you are at risk.  Ask your health care provider about whether you are at high risk for HIV. Your health care provider may recommend a prescription medicine to help prevent HIV infection. If you choose to take medicine to prevent HIV, you should first get tested for HIV. You should then be tested every 3 months for as long as you are taking  the medicine. Pregnancy  If you are about to stop having your period (premenopausal) and you may become pregnant, seek counseling before you get pregnant.  Take 400 to 800 micrograms (mcg) of folic acid every day if you become pregnant.  Ask for birth control (contraception) if you want to prevent pregnancy. Osteoporosis and menopause Osteoporosis is a disease in which the bones lose minerals and strength with aging. This can result in bone fractures. If you are 37 years old or older, or if you are at risk for osteoporosis and fractures, ask your health care provider if you should:  Be screened for bone loss.  Take a calcium or vitamin D supplement to lower your risk of fractures.  Be given hormone replacement therapy (HRT) to treat symptoms of menopause. Follow these instructions at home: Lifestyle  Do not use any products that contain nicotine or tobacco, such as cigarettes, e-cigarettes, and chewing tobacco. If you need help quitting, ask your health care provider.  Do not use street drugs.  Do not share needles.  Ask your health care provider for help if you need support or information about quitting drugs. Alcohol use  Do not drink alcohol if: ? Your health care provider tells you not to drink. ? You are pregnant, may be pregnant, or are planning to become pregnant.  If you drink alcohol: ? Limit how much you use to 0-1 drink a day. ? Limit intake if you are breastfeeding.  Be aware of how much alcohol is in your drink. In the [place] one drink equals one 12 oz bottle of beer (355 mL), one 5 oz glass of wine (148 mL), or one 1 oz glass of hard liquor (44 mL). General instructions  Schedule regular health, dental, and eye exams.  Stay current with your vaccines.  Tell your health care provider if: ? You often feel depressed. ? You have ever been abused or do not feel safe at home. Summary  Adopting a healthy lifestyle and getting preventive care are important in  promoting health and wellness.  Follow your health care provider's instructions about healthy diet, exercising, and getting tested or screened for diseases.  Follow your health care provider's instructions on monitoring your cholesterol and blood pressure. This information is not intended to replace advice given to you by your health care provider. Make sure you discuss any questions you have with your health care provider. Document Released: 12/19/2010 Document Revised: 05/29/2018 Document Reviewed: 05/29/2018 Elsevier Patient Education  2020 [place],  [facility]

## 2019-01-08 NOTE — Patient Instructions (Addendum)
   If you have lab work done today you will be contacted with your lab results within the next 2 weeks.  If you have not heard from us then please contact us. The fastest way to get your results is to register for My Chart.   IF you received an x-ray today, you will receive an invoice from [facility]. Please contact [facility] at [phone] with questions or concerns regarding your invoice.   IF you received labwork today, you will receive an invoice from [facility]. Please contact [facility] at [phone] with questions or concerns regarding your invoice.   Our billing staff will not be able to assist you with questions regarding bills from these companies.  You will be contacted with the lab results as soon as they are available. The fastest way to get your results is to activate your My Chart account. Instructions are located on the last page of this paperwork. If you have not heard from us regarding the results in 2 weeks, please contact this office.      Health Maintenance, Female Adopting a healthy lifestyle and getting preventive care are important in promoting health and wellness. Ask your health care provider about:  The right schedule for you to have regular tests and exams.  Things you can do on your own to prevent diseases and keep yourself healthy. What should I know about diet, weight, and exercise? Eat a healthy diet   Eat a diet that includes plenty of vegetables, fruits, low-fat dairy products, and lean protein.  Do not eat a lot of foods that are high in solid fats, added sugars, or sodium. Maintain a healthy weight Body mass index (BMI) is used to identify weight problems. It estimates body fat based on height and weight. Your health care provider can help determine your BMI and help you achieve or maintain a healthy weight. Get regular exercise Get regular exercise. This is one of the most important things you can do for your health. Most  adults should:  Exercise for at least 150 minutes each week. The exercise should increase your heart rate and make you sweat (moderate-intensity exercise).  Do strengthening exercises at least twice a week. This is in addition to the moderate-intensity exercise.  Spend less time sitting. Even light physical activity can be beneficial. Watch cholesterol and blood lipids Have your blood tested for lipids and cholesterol at 56 years of age, then have this test every 5 years. Have your cholesterol levels checked more often if:  Your lipid or cholesterol levels are high.  You are older than 56 years of age.  You are at high risk for heart disease. What should I know about cancer screening? Depending on your health history and family history, you may need to have cancer screening at various ages. This may include screening for:  Breast cancer.  Cervical cancer.  Colorectal cancer.  Skin cancer.  Lung cancer. What should I know about heart disease, diabetes, and high blood pressure? Blood pressure and heart disease  High blood pressure causes heart disease and increases the risk of stroke. This is more likely to develop in people who have high blood pressure readings, are of African descent, or are overweight.  Have your blood pressure checked: ? Every 3-5 years if you are 18-39 years of age. ? Every year if you are 40 years old or older. Diabetes Have regular diabetes screenings. This checks your fasting blood sugar level. Have the screening done:  Once every   three years after age 40 if you are at a normal weight and have a low risk for diabetes.  More often and at a younger age if you are overweight or have a high risk for diabetes. What should I know about preventing infection? Hepatitis B If you have a higher risk for hepatitis B, you should be screened for this virus. Talk with your health care provider to find out if you are at risk for hepatitis B infection. Hepatitis  C Testing is recommended for:  Everyone born from 1945 through 1965.  Anyone with known risk factors for hepatitis C. Sexually transmitted infections (STIs)  Get screened for STIs, including gonorrhea and chlamydia, if: ? You are sexually active and are younger than 56 years of age. ? You are older than 56 years of age and your health care provider tells you that you are at risk for this type of infection. ? Your sexual activity has changed since you were last screened, and you are at increased risk for chlamydia or gonorrhea. Ask your health care provider if you are at risk.  Ask your health care provider about whether you are at high risk for HIV. Your health care provider may recommend a prescription medicine to help prevent HIV infection. If you choose to take medicine to prevent HIV, you should first get tested for HIV. You should then be tested every 3 months for as long as you are taking the medicine. Pregnancy  If you are about to stop having your period (premenopausal) and you may become pregnant, seek counseling before you get pregnant.  Take 400 to 800 micrograms (mcg) of folic acid every day if you become pregnant.  Ask for birth control (contraception) if you want to prevent pregnancy. Osteoporosis and menopause Osteoporosis is a disease in which the bones lose minerals and strength with aging. This can result in bone fractures. If you are 65 years old or older, or if you are at risk for osteoporosis and fractures, ask your health care provider if you should:  Be screened for bone loss.  Take a calcium or vitamin D supplement to lower your risk of fractures.  Be given hormone replacement therapy (HRT) to treat symptoms of menopause. Follow these instructions at home: Lifestyle  Do not use any products that contain nicotine or tobacco, such as cigarettes, e-cigarettes, and chewing tobacco. If you need help quitting, ask your health care provider.  Do not use street  drugs.  Do not share needles.  Ask your health care provider for help if you need support or information about quitting drugs. Alcohol use  Do not drink alcohol if: ? Your health care provider tells you not to drink. ? You are pregnant, may be pregnant, or are planning to become pregnant.  If you drink alcohol: ? Limit how much you use to 0-1 drink a day. ? Limit intake if you are breastfeeding.  Be aware of how much alcohol is in your drink. In the [place] one drink equals one 12 oz bottle of beer (355 mL), one 5 oz glass of wine (148 mL), or one 1 oz glass of hard liquor (44 mL). General instructions  Schedule regular health, dental, and eye exams.  Stay current with your vaccines.  Tell your health care provider if: ? You often feel depressed. ? You have ever been abused or do not feel safe at home. Summary  Adopting a healthy lifestyle and getting preventive care are important in promoting health and wellness.    Follow your health care provider's instructions about healthy diet, exercising, and getting tested or screened for diseases.  Follow your health care provider's instructions on monitoring your cholesterol and blood pressure. This information is not intended to replace advice given to you by your health care provider. Make sure you discuss any questions you have with your health care provider. Document Released: 12/19/2010 Document Revised: 05/29/2018 Document Reviewed: 05/29/2018 Elsevier Patient Education  2020 [employer]  

## 2019-01-09 ENCOUNTER — Encounter: Payer: Self-pay | Admitting: *Deleted

## 2019-01-09 LAB — CBC WITH DIFFERENTIAL/PLATELET
Basophils Absolute: 0.1 10*3/uL (ref 0.0–0.2)
Basos: 1 %
EOS (ABSOLUTE): 0.2 10*3/uL (ref 0.0–0.4)
Eos: 3 %
Hematocrit: 34.2 % (ref 34.0–46.6)
Hemoglobin: 11.2 g/dL (ref 11.1–15.9)
Immature Grans (Abs): 0 10*3/uL (ref 0.0–0.1)
Immature Granulocytes: 1 %
Lymphocytes Absolute: 2.2 10*3/uL (ref 0.7–3.1)
Lymphs: 37 %
MCH: 27.9 pg (ref 26.6–33.0)
MCHC: 32.7 g/dL (ref 31.5–35.7)
MCV: 85 fL (ref 79–97)
Monocytes Absolute: 0.6 10*3/uL (ref 0.1–0.9)
Monocytes: 10 %
Neutrophils Absolute: 3 10*3/uL (ref 1.4–7.0)
Neutrophils: 48 %
Platelets: 404 10*3/uL (ref 150–450)
RBC: 4.02 x10E6/uL (ref 3.77–5.28)
RDW: 13.1 % (ref 11.7–15.4)
WBC: 6.1 10*3/uL (ref 3.4–10.8)

## 2019-01-09 LAB — COMPREHENSIVE METABOLIC PANEL
ALT: 24 IU/L (ref 0–32)
AST: 20 IU/L (ref 0–40)
Albumin/Globulin Ratio: 1.3 (ref 1.2–2.2)
Albumin: 4.2 g/dL (ref 3.8–4.9)
Alkaline Phosphatase: 88 IU/L (ref 39–117)
BUN/Creatinine Ratio: 15 (ref 9–23)
BUN: 9 mg/dL (ref 6–24)
Bilirubin Total: 0.6 mg/dL (ref 0.0–1.2)
CO2: 24 mmol/L (ref 20–29)
Calcium: 9 mg/dL (ref 8.7–10.2)
Chloride: 99 mmol/L (ref 96–106)
Creatinine, Ser: 0.61 mg/dL (ref 0.57–1.00)
GFR calc Af Amer: 117 mL/min/{1.73_m2} (ref >59)
GFR calc non Af Amer: 102 mL/min/{1.73_m2} (ref >59)
Globulin, Total: 3.3 g/dL (ref 1.5–4.5)
Glucose: 79 mg/dL (ref 65–99)
Potassium: 4.2 mmol/L (ref 3.5–5.2)
Sodium: 140 mmol/L (ref 134–144)
Total Protein: 7.5 g/dL (ref 6.0–8.5)

## 2019-01-09 LAB — HEMOGLOBIN A1C
Est. average glucose Bld gHb Est-mCnc: 117 mg/dL
Hgb A1c MFr Bld: 5.7 % — ABNORMAL HIGH (ref 4.8–5.6)

## 2019-01-09 LAB — LIPID PANEL
Chol/HDL Ratio: 3.2 ratio (ref 0.0–4.4)
Cholesterol, Total: 186 mg/dL (ref 100–199)
HDL: 59 mg/dL (ref >39)
LDL Calculated: 104 mg/dL — ABNORMAL HIGH (ref 0–99)
Triglycerides: 114 mg/dL (ref 0–149)
VLDL Cholesterol Cal: 23 mg/dL (ref 5–40)

## 2019-01-23 LAB — HM MAMMOGRAPHY

## 2019-01-27 ENCOUNTER — Encounter: Payer: Self-pay | Admitting: *Deleted

## 2019-07-04 ENCOUNTER — Ambulatory Visit: Payer: BC Managed Care – PPO | Attending: Internal Medicine

## 2019-07-04 DIAGNOSIS — Z20822 Contact with and (suspected) exposure to covid-19: Secondary | ICD-10-CM

## 2019-07-06 LAB — NOVEL CORONAVIRUS, NAA: SARS-CoV-2, NAA: NOT DETECTED

## 2019-07-07 ENCOUNTER — Other Ambulatory Visit: Payer: Self-pay

## 2019-07-07 ENCOUNTER — Ambulatory Visit (INDEPENDENT_AMBULATORY_CARE_PROVIDER_SITE_OTHER): Payer: BC Managed Care – PPO | Admitting: Emergency Medicine

## 2019-07-07 ENCOUNTER — Encounter: Payer: Self-pay | Admitting: Emergency Medicine

## 2019-07-07 VITALS — BP 150/84 | HR 63 | Temp 97.9°F | Resp 16 | Ht 65.5 in | Wt 255.0 lb

## 2019-07-07 DIAGNOSIS — G4733 Obstructive sleep apnea (adult) (pediatric): Secondary | ICD-10-CM

## 2019-07-07 DIAGNOSIS — D500 Iron deficiency anemia secondary to blood loss (chronic): Secondary | ICD-10-CM | POA: Diagnosis not present

## 2019-07-07 DIAGNOSIS — I10 Essential (primary) hypertension: Secondary | ICD-10-CM | POA: Diagnosis not present

## 2019-07-07 DIAGNOSIS — E785 Hyperlipidemia, unspecified: Secondary | ICD-10-CM | POA: Diagnosis not present

## 2019-07-07 DIAGNOSIS — K0889 Other specified disorders of teeth and supporting structures: Secondary | ICD-10-CM

## 2019-07-07 DIAGNOSIS — M059 Rheumatoid arthritis with rheumatoid factor, unspecified: Secondary | ICD-10-CM

## 2019-07-07 MED ORDER — LISINOPRIL 40 MG PO TABS: 40.0000 mg | ORAL_TABLET | Freq: Every day | ORAL | 3 refills | Status: DC

## 2019-07-07 MED ORDER — ATORVASTATIN CALCIUM 40 MG PO TABS: 40.0000 mg | ORAL_TABLET | Freq: Every day | ORAL | 3 refills | Status: DC

## 2019-07-07 MED ORDER — AMOXICILLIN-POT CLAVULANATE 875-125 MG PO TABS: 1.0000 | ORAL_TABLET | Freq: Two times a day (BID) | ORAL | 0 refills | Status: AC

## 2019-07-07 MED ORDER — POLYSACCHARIDE IRON COMPLEX 150 MG PO CAPS: ORAL_CAPSULE | ORAL | 3 refills | Status: DC

## 2019-07-07 NOTE — Progress Notes (Signed)
Laurie Hodge 59 y.o.   Chief Complaint  Patient presents with  . Medication Refill    Atorvastatin, Lisinopril and NIFEREX  . Dental Pain    per pt on the RIGHT side x 5 days    HISTORY OF PRESENT ILLNESS: This is a 59 y.o. female with chronic medical problems here for follow-up and medication refill. 1.  History of hypertension: On lisinopril 40 mg daily.  Normal blood pressure readings at home. 2.  History of rheumatoid arthritis: Sees rheumatologist regularly.  On Humira and meloxicam as needed. 3.  History of dyslipidemia: On atorvastatin 40 mg daily. 4.  History of obstructive sleep apnea. 5.  History of iron deficiency anemia: On Niferex. Had recent blood work at rheumatologist office.  No concerns. New problem: Right sided upper tooth ache for the past 5 days, progressively getting worse.  Has not seen a dentist yet.  HPI   Prior to Admission medications   Medication Sig Start Date End Date Taking? Authorizing Provider  Adalimumab (HUMIRA PEN) 40 MG/0.8ML PNKT Inject 40 mg into the skin every 14 (fourteen) days. 07/06/15  Yes Jeffery, Chelle, PA  atorvastatin (LIPITOR) 40 MG tablet Take 1 tablet (40 mg total) by mouth daily. 07/07/19  Yes Analie Katzman, [name], MD  Biotin 5 MG CAPS Take by mouth.   Yes [provider]  cetirizine (ZYRTEC) 10 MG tablet Take 10 mg by mouth daily as needed for allergies.    Yes [provider]  cholecalciferol (VITAMIN D) 1000 UNITS tablet Take 2,000 Units by mouth daily.    Yes [provider]  Cyanocobalamin (VITAMIN B-12) 2500 MCG SUBL Place 2,500 mcg under the tongue daily.   Yes [provider]  docusate sodium (COLACE) 100 MG capsule Take 100 mg by mouth as needed.    Yes [provider]  Fish Oil-Krill Oil (KRILL OIL PLUS) CAPS Take 750 mg by mouth daily.   Yes [provider]  iron polysaccharides (FERREX 150) 150 MG capsule TAKE ONE CAPSULE BY MOUTH TWICE DAILY. 07/07/19  Yes  Telesha Deguzman, [name], MD  meloxicam (MOBIC) 7.5 MG tablet Take 1 tablet (7.5 mg total) by mouth as needed for pain. 09/25/17  Yes Jeffery, Chelle, PA  amoxicillin-clavulanate (AUGMENTIN) 875-125 MG tablet Take 1 tablet by mouth 2 (two) times daily for 7 days. 07/07/19 07/14/19  Horald Pollen, MD  calcium-vitamin D (OSCAL WITH D) 500-200 MG-UNIT tablet Take 1 tablet by mouth 3 (three) times daily.    [provider]  lisinopril (ZESTRIL) 40 MG tablet Take 1 tablet (40 mg total) by mouth daily. 07/07/19   Horald Pollen, MD  Multiple Vitamin (MULTIVITAMIN WITH MINERALS) TABS tablet Take 1 tablet by mouth 2 (two) times daily.    [provider]  omeprazole (PRILOSEC) 40 MG capsule Take 1 capsule (40 mg total) by mouth daily. Patient not taking: Reported on 07/07/2019 06/06/18   Horald Pollen, MD  senna (SENOKOT) 8.6 MG TABS tablet Take 2 tablets by mouth as needed for mild constipation. Reported on 10/12/2015    [provider]    Allergies  Allergen Reactions  . Adhesive [Tape] Rash    Skin Peels    Patient Active Problem List   Diagnosis Date Noted  . Osteoarthritis 02/05/2015  . Hyperlipidemia 11/06/2013  . Rheumatoid arthritis ([place]) 06/10/2013  . BMI 36.0-36.9,adult   . GERD (gastroesophageal reflux disease)   . OSA (obstructive sleep apnea)     Class: Minor  .  HTN (hypertension)   . Vitamin D insufficiency   . Iron (Fe) deficiency anemia     Past Medical History:  Diagnosis Date  . Allergy   . Arthritis    RA  . Depression   . GERD (gastroesophageal reflux disease)   . Hemorrhoid   . HSV-2 infection    IgG  . HTN (hypertension)   . Hyperlipidemia   . Iron (Fe) deficiency anemia   . Obesity, Class III, BMI 40-49.9 (morbid obesity) ([place])   . OSA on CPAP    cpap broke this week  . OSA on CPAP   . Ulcer    gastric - h. pylori positive  . Vitamin D insufficiency     Past Surgical History:  Procedure Laterality Date  .  LAPAROSCOPIC GASTRIC SLEEVE RESECTION N/A 05/31/2015   Procedure: LAPAROSCOPIC GASTRIC SLEEVE RESECTION;  Surgeon: Arta Bruce Kinsinger, MD;  Location: [facility];  Service: General;  Laterality: N/A;  . TUBAL LIGATION      Social History   Socioeconomic History  . Marital status: Divorced    Spouse name: n/a  . Number of children: 3  . Years of education: 11  . Highest education level: Not on file  Occupational History  . 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 Smoking status: Never Smoker  . Smokeless tobacco: Never Used  Substance and Sexual Activity  . Alcohol use: Yes    Alcohol/week: 0.0 standard drinks    Comment: 2-3 times a year at special events  . Drug use: No  . Sexual activity: Not Currently    Partners: Male    Comment: not active since divorce from husband 2010  Other Topics Concern  . Not on file  Social History Narrative   Lives with daughter. Her mother who has dementia and had a stroke lives with her during the week and her siblings take turns staying with their mother on the weekends.      Denies caffeine use    Social Determinants of Radio broadcast assistant Strain:   . Difficulty of Paying Living Expenses: Not on file  Food Insecurity:   . Worried About Charity fundraiser in the Last Year: Not on file  . Ran Out of Food in the Last Year: Not on file  Transportation Needs:   . Lack of Transportation (Medical): Not on file  . Lack of Transportation (Non-Medical): Not on file  Physical Activity:   . Days of Exercise per Week: Not on file  . Minutes of Exercise per Session: Not on file  Stress:   . Feeling of Stress : Not on file  Social Connections:   . Frequency of Communication with Friends and Family: Not on file  . Frequency of Social Gatherings with Friends and Family: Not on file  . Attends Religious Services: Not on file  . Active Member of Clubs or  Organizations: Not on file  . Attends Archivist Meetings: Not on file  . Marital Status: Not on file  Intimate Partner Violence:   . Fear of Current or Ex-Partner: Not on file  . Emotionally Abused: Not on file  . Physically Abused: Not on file  . Sexually Abused: Not on file    Family History  Problem Relation Age of Onset  . Diabetes Mother   . Hypertension Mother   . Dementia Mother   . Stroke Mother 40  .  Hyperlipidemia Mother   . Cancer Paternal Aunt        breast ca x 5 maternal aunts  . Diabetes Sister   . Heart disease Sister   . Hyperlipidemia Sister   . Hypertension Sister   . Heart disease Father   . Hyperlipidemia Father   . Hypertension Father   . Stroke Father   . Heart disease Sister   . Hyperlipidemia Sister   . Hypertension Sister   . Diabetes Brother   . Hyperlipidemia Brother   . Hypertension Brother   . Hypertension Son   . Colon cancer Neg Hx   . Esophageal cancer Neg Hx   . Rectal cancer Neg Hx   . Stomach cancer Neg Hx      Review of Systems  Constitutional: Negative.  Negative for chills and fever.  HENT: Negative.  Negative for congestion and sore throat.        Tooth ache  Respiratory: Negative.  Negative for cough and shortness of breath.   Cardiovascular: Negative.  Negative for chest pain and palpitations.  Gastrointestinal: Negative.  Negative for abdominal pain, blood in stool, diarrhea, nausea and vomiting.  Genitourinary: Negative.   Musculoskeletal: Positive for joint pain. Negative for myalgias and neck pain.  Skin: Negative.  Negative for rash.  Neurological: Negative.  Negative for dizziness and headaches.  All other systems reviewed and are negative.     Today's Vitals   07/07/19 0839  BP: (!) 150/84  Pulse: 63  Resp: 16  Temp: 97.9 F (36.6 C)  TempSrc: Temporal  SpO2: 98%  Weight: 255 lb (115.7 kg)  Height: 5' 5.5" (1.664 m)   Body mass index is 41.79 kg/m.  Physical Exam Vitals reviewed.    Constitutional:      Appearance: Normal appearance.  HENT:     Head: Normocephalic.     Mouth/Throat:     Mouth: Mucous membranes are moist.     Pharynx: Oropharynx is clear.     Comments: Possible right upper molar infection Eyes:     Extraocular Movements: Extraocular movements intact.     Conjunctiva/sclera: Conjunctivae normal.     Pupils: Pupils are equal, round, and reactive to light.  Cardiovascular:     Rate and Rhythm: Normal rate and regular rhythm.  Pulmonary:     Effort: Pulmonary effort is normal.     Breath sounds: Normal breath sounds.  Musculoskeletal:        General: Normal range of motion.     Cervical back: Normal range of motion.  Skin:    General: Skin is warm and dry.     Capillary Refill: Capillary refill takes less than 2 seconds.  Neurological:     Mental Status: She is alert and oriented to person, place, and time.  Psychiatric:        Mood and Affect: Mood normal.        Behavior: Behavior normal.    A total of 30 minutes was spent in the room with the patient, greater than 50% of which was in counseling/coordination of care regarding chronic medical problems including hypertension, dyslipidemia, rheumatoid arthritis, management including medications diet and nutrition, review of most recent blood work, discussion of possible periodontal infection and need for antibiotics and evaluation by a dentist, prognosis and need for follow-up..   ASSESSMENT & PLAN: Daleena was seen today for medication refill and dental pain.  Diagnoses and all orders for this visit:  Essential hypertension -     lisinopril (  ZESTRIL) 40 MG tablet; Take 1 tablet (40 mg total) by mouth daily.  Hyperlipidemia, unspecified hyperlipidemia type -     atorvastatin (LIPITOR) 40 MG tablet; Take 1 tablet (40 mg total) by mouth daily.  Iron deficiency anemia due to chronic blood loss -     iron polysaccharides (FERREX 150) 150 MG capsule; TAKE ONE CAPSULE BY MOUTH TWICE  DAILY.  OSA (obstructive sleep apnea)  Rheumatoid arthritis with positive rheumatoid factor, involving unspecified site [facility])  Toothache Comments: Suspected periodontal infection Orders: -     amoxicillin-clavulanate (AUGMENTIN) 875-125 MG tablet; Take 1 tablet by mouth 2 (two) times daily for 7 days.    Patient Instructions       If you have lab work done today you will be contacted with your lab results within the next 2 weeks.  If you have not heard from [place] then please contact us. The fastest way to get your results is to register for My Chart.   IF you received an x-ray today, you will receive an invoice from [facility]. Please contact [facility] at [phone] with questions or concerns regarding your invoice.   IF you received labwork today, you will receive an invoice from [facility]. Please contact [facility] at [phone] with questions or concerns regarding your invoice.   Our billing staff will not be able to assist you with questions regarding bills from these companies.  You will be contacted with the lab results as soon as they are available. The fastest way to get your results is to activate your My Chart account. Instructions are located on the last page of this paperwork. If you have not heard from [place] regarding the results in 2 weeks, please contact this office.     Dental Pain Dental pain may be caused by many things, including:  Tooth decay (cavities or caries).  Infection.  The inner part of the tooth being filled with pus (abscess).  Injury. Sometimes the cause of pain is unknown. Your pain can vary. It may be mild or severe. You may have it all the time, or it may occur only when you are:  Chewing.  Exposed to hot or cold temperature.  Eating or drinking sugary foods or beverages, such as soda or candy. Follow these instructions at home: Medicines  Take over-the-counter and prescription medicines only as told by your  doctor.  If you were prescribed an antibiotic medicine, take it as told by your doctor. Do not stop taking the medicine even if you start to feel better. Eating and drinking  Do not eat foods or drinks that cause you pain. These include: ? Very hot or very cold foods or drinks. ? Sweet or sugary foods or drinks. Managing pain and swelling   Gargle with a salt-water mixture 3-4 times a day. To make this, dissolve -1 tsp of salt in 1 cup of warm water.  If told, put ice on the painful area of your face: ? Put ice in a plastic bag. ? Place a towel between your skin and the bag. ? Leave the ice on for 20 minutes, 2-3 times a day. Brushing your teeth  Brush your teeth twice a day using a fluoride toothpaste.  Floss your teeth once a day.  Use a toothpaste made for sensitive teeth as told by your doctor.  Use a soft toothbrush. General instructions  Do not apply heat to the outside of your face.  Watch your dental pain. Let your doctor know if there  are any changes.  Keep all follow-up visits as told by your doctor. This is important. Contact a doctor if:  Your pain is not relieved by medicines.  You have new symptoms.  Your symptoms get worse. Get help right away if:  You cannot open your mouth.  You are having trouble breathing or swallowing.  You have a fever.  Your face, neck, or jaw is swollen. Summary  Dental pain may be caused by many things, including tooth decay, injury, or infection. In some cases, the cause is not known.  Your pain may be mild or severe. You may have pain all the time, or you may have it only when you eat or drink.  Take over-the-counter and prescription medicines only as told by your doctor.  Watch your dental pain for any changes. Let your doctor know if symptoms get worse. This information is not intended to replace advice given to you by your health care provider. Make sure you discuss any questions you have with your health care  provider. Document Revised: 10/01/2018 Document Reviewed: 06/18/2017 Elsevier Patient Education  [place].  Hypertension, Adult High blood pressure (hypertension) is when the force of blood pumping through the arteries is too strong. The arteries are the blood vessels that carry blood from the heart throughout the body. Hypertension forces the heart to work harder to pump blood and may cause arteries to become narrow or stiff. Untreated or uncontrolled hypertension can cause a heart attack, heart failure, a stroke, kidney disease, and other problems. A blood pressure reading consists of a higher number over a lower number. Ideally, your blood pressure should be below 120/80. The first ("top") number is called the systolic pressure. It is a measure of the pressure in your arteries as your heart beats. The second ("bottom") number is called the diastolic pressure. It is a measure of the pressure in your arteries as the heart relaxes. What are the causes? The exact cause of this condition is not known. There are some conditions that result in or are related to high blood pressure. What increases the risk? Some risk factors for high blood pressure are under your control. The following factors may make you more likely to develop this condition:  Smoking.  Having type 2 diabetes mellitus, high cholesterol, or both.  Not getting enough exercise or physical activity.  Being overweight.  Having too much fat, sugar, calories, or salt (sodium) in your diet.  Drinking too much alcohol. Some risk factors for high blood pressure may be difficult or impossible to change. Some of these factors include:  Having chronic kidney disease.  Having a family history of high blood pressure.  Age. Risk increases with age.  Race. You may be at higher risk if you are African American.  Gender. Men are at higher risk than women before age 35. After age 34, women are at higher risk than men.  Having  obstructive sleep apnea.  Stress. What are the signs or symptoms? High blood pressure may not cause symptoms. Very high blood pressure (hypertensive crisis) may cause:  Headache.  Anxiety.  Shortness of breath.  Nosebleed.  Nausea and vomiting.  Vision changes.  Severe chest pain.  Seizures. How is this diagnosed? This condition is diagnosed by measuring your blood pressure while you are seated, with your arm resting on a flat surface, your legs uncrossed, and your feet flat on the floor. The cuff of the blood pressure monitor will be placed directly against the skin  of your upper arm at the level of your heart. It should be measured at least twice using the same arm. Certain conditions can cause a difference in blood pressure between your right and left arms. Certain factors can cause blood pressure readings to be lower or higher than normal for a short period of time:  When your blood pressure is higher when you are in a health care provider's office than when you are at home, this is called white coat hypertension. Most people with this condition do not need medicines.  When your blood pressure is higher at home than when you are in a health care provider's office, this is called masked hypertension. Most people with this condition may need medicines to control blood pressure. If you have a high blood pressure reading during one visit or you have normal blood pressure with other risk factors, you may be asked to:  Return on a different day to have your blood pressure checked again.  Monitor your blood pressure at home for 1 week or longer. If you are diagnosed with hypertension, you may have other blood or imaging tests to help your health care provider understand your overall risk for other conditions. How is this treated? This condition is treated by making healthy lifestyle changes, such as eating healthy foods, exercising more, and reducing your alcohol intake. Your health  care provider may prescribe medicine if lifestyle changes are not enough to get your blood pressure under control, and if:  Your systolic blood pressure is above 130.  Your diastolic blood pressure is above 80. Your personal target blood pressure may vary depending on your medical conditions, your age, and other factors. Follow these instructions at home: Eating and drinking   Eat a diet that is high in fiber and potassium, and low in sodium, added sugar, and fat. An example eating plan is called the DASH (Dietary Approaches to Stop Hypertension) diet. To eat this way: ? Eat plenty of fresh fruits and vegetables. Try to fill one half of your plate at each meal with fruits and vegetables. ? Eat whole grains, such as whole-wheat pasta, Manna rice, or whole-grain bread. Fill about one fourth of your plate with whole grains. ? Eat or drink low-fat dairy products, such as skim milk or low-fat yogurt. ? Avoid fatty cuts of meat, processed or cured meats, and poultry with skin. Fill about one fourth of your plate with lean proteins, such as fish, chicken without skin, beans, eggs, or tofu. ? Avoid pre-made and processed foods. These tend to be higher in sodium, added sugar, and fat.  Reduce your daily sodium intake. Most people with hypertension should eat less than 1,500 mg of sodium a day.  Do not drink alcohol if: ? Your health care provider tells you not to drink. ? You are pregnant, may be pregnant, or are planning to become pregnant.  If you drink alcohol: ? Limit how much you use to:  0-1 drink a day for women.  0-2 drinks a day for men. ? Be aware of how much alcohol is in your drink. In the [place] one drink equals one 12 oz bottle of beer (355 mL), one 5 oz glass of wine (148 mL), or one 1 oz glass of hard liquor (44 mL). Lifestyle   Work with your health care provider to maintain a healthy body weight or to lose weight. Ask what an ideal weight is for you.  Get at least 30  minutes of exercise most  days of the week. Activities may include walking, swimming, or biking.  Include exercise to strengthen your muscles (resistance exercise), such as Pilates or lifting weights, as part of your weekly exercise routine. Try to do these types of exercises for 30 minutes at least 3 days a week.  Do not use any products that contain nicotine or tobacco, such as cigarettes, e-cigarettes, and chewing tobacco. If you need help quitting, ask your health care provider.  Monitor your blood pressure at home as told by your health care provider.  Keep all follow-up visits as told by your health care provider. This is important. Medicines  Take over-the-counter and prescription medicines only as told by your health care provider. Follow directions carefully. Blood pressure medicines must be taken as prescribed.  Do not skip doses of blood pressure medicine. Doing this puts you at risk for problems and can make the medicine less effective.  Ask your health care provider about side effects or reactions to medicines that you should watch for. Contact a health care provider if you:  Think you are having a reaction to a medicine you are taking.  Have headaches that keep coming back (recurring).  Feel dizzy.  Have swelling in your ankles.  Have trouble with your vision. Get help right away if you:  Develop a severe headache or confusion.  Have unusual weakness or numbness.  Feel faint.  Have severe pain in your chest or abdomen.  Vomit repeatedly.  Have trouble breathing. Summary  Hypertension is when the force of blood pumping through your arteries is too strong. If this condition is not controlled, it may put you at risk for serious complications.  Your personal target blood pressure may vary depending on your medical conditions, your age, and other factors. For most people, a normal blood pressure is less than 120/80.  Hypertension is treated with lifestyle  changes, medicines, or a combination of both. Lifestyle changes include losing weight, eating a healthy, low-sodium diet, exercising more, and limiting alcohol. This information is not intended to replace advice given to you by your health care provider. Make sure you discuss any questions you have with your health care provider. Document Revised: 02/13/2018 Document Reviewed: 02/13/2018 Elsevier Patient Education  2020 Elsevier Inc.      Agustina Caroli, MD [facility]

## 2019-07-07 NOTE — Patient Instructions (Addendum)
If you have lab work done today you will be contacted with your lab results within the next 2 weeks.  If you have not heard from [place] then please contact us. The fastest way to get your results is to register for My Chart.   IF you received an x-ray today, you will receive an invoice from [facility]. Please contact [facility] at [phone] with questions or concerns regarding your invoice.   IF you received labwork today, you will receive an invoice from [place]. Please contact [facility] at [phone] with questions or concerns regarding your invoice.   Our billing staff will not be able to assist you with questions regarding bills from these companies.  You will be contacted with the lab results as soon as they are available. The fastest way to get your results is to activate your My Chart account. Instructions are located on the last page of this paperwork. If you have not heard from [place] regarding the results in 2 weeks, please contact this office.     Dental Pain Dental pain may be caused by many things, including:  Tooth decay (cavities or caries).  Infection.  The inner part of the tooth being filled with pus (abscess).  Injury. Sometimes the cause of pain is unknown. Your pain can vary. It may be mild or severe. You may have it all the time, or it may occur only when you are:  Chewing.  Exposed to hot or cold temperature.  Eating or drinking sugary foods or beverages, such as soda or candy. Follow these instructions at home: Medicines  Take over-the-counter and prescription medicines only as told by your doctor.  If you were prescribed an antibiotic medicine, take it as told by your doctor. Do not stop taking the medicine even if you start to feel better. Eating and drinking  Do not eat foods or drinks that cause you pain. These include: ? Very hot or very cold foods or drinks. ? Sweet or sugary foods or drinks. Managing pain and  swelling   Gargle with a salt-water mixture 3-4 times a day. To make this, dissolve -1 tsp of salt in 1 cup of warm water.  If told, put ice on the painful area of your face: ? Put ice in a plastic bag. ? Place a towel between your skin and the bag. ? Leave the ice on for 20 minutes, 2-3 times a day. Brushing your teeth  Brush your teeth twice a day using a fluoride toothpaste.  Floss your teeth once a day.  Use a toothpaste made for sensitive teeth as told by your doctor.  Use a soft toothbrush. General instructions  Do not apply heat to the outside of your face.  Watch your dental pain. Let your doctor know if there are any changes.  Keep all follow-up visits as told by your doctor. This is important. Contact a doctor if:  Your pain is not relieved by medicines.  You have new symptoms.  Your symptoms get worse. Get help right away if:  You cannot open your mouth.  You are having trouble breathing or swallowing.  You have a fever.  Your face, neck, or jaw is swollen. Summary  Dental pain may be caused by many things, including tooth decay, injury, or infection. In some cases, the cause is not known.  Your pain may be mild or severe. You may have pain all the time, or you may have it only when you eat or  drink.  Take over-the-counter and prescription medicines only as told by your doctor.  Watch your dental pain for any changes. Let your doctor know if symptoms get worse. This information is not intended to replace advice given to you by your health care provider. Make sure you discuss any questions you have with your health care provider. Document Revised: 10/01/2018 Document Reviewed: 06/18/2017 Elsevier Patient Education  [place].  Hypertension, Adult High blood pressure (hypertension) is when the force of blood pumping through the arteries is too strong. The arteries are the blood vessels that carry blood from the heart throughout the body.  Hypertension forces the heart to work harder to pump blood and may cause arteries to become narrow or stiff. Untreated or uncontrolled hypertension can cause a heart attack, heart failure, a stroke, kidney disease, and other problems. A blood pressure reading consists of a higher number over a lower number. Ideally, your blood pressure should be below 120/80. The first ("top") number is called the systolic pressure. It is a measure of the pressure in your arteries as your heart beats. The second ("bottom") number is called the diastolic pressure. It is a measure of the pressure in your arteries as the heart relaxes. What are the causes? The exact cause of this condition is not known. There are some conditions that result in or are related to high blood pressure. What increases the risk? Some risk factors for high blood pressure are under your control. The following factors may make you more likely to develop this condition:  Smoking.  Having type 2 diabetes mellitus, high cholesterol, or both.  Not getting enough exercise or physical activity.  Being overweight.  Having too much fat, sugar, calories, or salt (sodium) in your diet.  Drinking too much alcohol. Some risk factors for high blood pressure may be difficult or impossible to change. Some of these factors include:  Having chronic kidney disease.  Having a family history of high blood pressure.  Age. Risk increases with age.  Race. You may be at higher risk if you are African American.  Gender. Men are at higher risk than women before age 21. After age 57, women are at higher risk than men.  Having obstructive sleep apnea.  Stress. What are the signs or symptoms? High blood pressure may not cause symptoms. Very high blood pressure (hypertensive crisis) may cause:  Headache.  Anxiety.  Shortness of breath.  Nosebleed.  Nausea and vomiting.  Vision changes.  Severe chest pain.  Seizures. How is this  diagnosed? This condition is diagnosed by measuring your blood pressure while you are seated, with your arm resting on a flat surface, your legs uncrossed, and your feet flat on the floor. The cuff of the blood pressure monitor will be placed directly against the skin of your upper arm at the level of your heart. It should be measured at least twice using the same arm. Certain conditions can cause a difference in blood pressure between your right and left arms. Certain factors can cause blood pressure readings to be lower or higher than normal for a short period of time:  When your blood pressure is higher when you are in a health care provider's office than when you are at home, this is called white coat hypertension. Most people with this condition do not need medicines.  When your blood pressure is higher at home than when you are in a health care provider's office, this is called masked hypertension. Most people  with this condition may need medicines to control blood pressure. If you have a high blood pressure reading during one visit or you have normal blood pressure with other risk factors, you may be asked to:  Return on a different day to have your blood pressure checked again.  Monitor your blood pressure at home for 1 week or longer. If you are diagnosed with hypertension, you may have other blood or imaging tests to help your health care provider understand your overall risk for other conditions. How is this treated? This condition is treated by making healthy lifestyle changes, such as eating healthy foods, exercising more, and reducing your alcohol intake. Your health care provider may prescribe medicine if lifestyle changes are not enough to get your blood pressure under control, and if:  Your systolic blood pressure is above 130.  Your diastolic blood pressure is above 80. Your personal target blood pressure may vary depending on your medical conditions, your age, and other  factors. Follow these instructions at home: Eating and drinking   Eat a diet that is high in fiber and potassium, and low in sodium, added sugar, and fat. An example eating plan is called the DASH (Dietary Approaches to Stop Hypertension) diet. To eat this way: ? Eat plenty of fresh fruits and vegetables. Try to fill one half of your plate at each meal with fruits and vegetables. ? Eat whole grains, such as whole-wheat pasta, [place] rice, or whole-grain bread. Fill about one fourth of your plate with whole grains. ? Eat or drink low-fat dairy products, such as skim milk or low-fat yogurt. ? Avoid fatty cuts of meat, processed or cured meats, and poultry with skin. Fill about one fourth of your plate with lean proteins, such as fish, chicken without skin, beans, eggs, or tofu. ? Avoid pre-made and processed foods. These tend to be higher in sodium, added sugar, and fat.  Reduce your daily sodium intake. Most people with hypertension should eat less than 1,500 mg of sodium a day.  Do not drink alcohol if: ? Your health care provider tells you not to drink. ? You are pregnant, may be pregnant, or are planning to become pregnant.  If you drink alcohol: ? Limit how much you use to:  0-1 drink a day for women.  0-2 drinks a day for men. ? Be aware of how much alcohol is in your drink. In the [place] one drink equals one 12 oz bottle of beer (355 mL), one 5 oz glass of wine (148 mL), or one 1 oz glass of hard liquor (44 mL). Lifestyle   Work with your health care provider to maintain a healthy body weight or to lose weight. Ask what an ideal weight is for you.  Get at least 30 minutes of exercise most days of the week. Activities may include walking, swimming, or biking.  Include exercise to strengthen your muscles (resistance exercise), such as Pilates or lifting weights, as part of your weekly exercise routine. Try to do these types of exercises for 30 minutes at least 3 days a week.  Do  not use any products that contain nicotine or tobacco, such as cigarettes, e-cigarettes, and chewing tobacco. If you need help quitting, ask your health care provider.  Monitor your blood pressure at home as told by your health care provider.  Keep all follow-up visits as told by your health care provider. This is important. Medicines  Take over-the-counter and prescription medicines only as told by your health  care provider. Follow directions carefully. Blood pressure medicines must be taken as prescribed.  Do not skip doses of blood pressure medicine. Doing this puts you at risk for problems and can make the medicine less effective.  Ask your health care provider about side effects or reactions to medicines that you should watch for. Contact a health care provider if you:  Think you are having a reaction to a medicine you are taking.  Have headaches that keep coming back (recurring).  Feel dizzy.  Have swelling in your ankles.  Have trouble with your vision. Get help right away if you:  Develop a severe headache or confusion.  Have unusual weakness or numbness.  Feel faint.  Have severe pain in your chest or abdomen.  Vomit repeatedly.  Have trouble breathing. Summary  Hypertension is when the force of blood pumping through your arteries is too strong. If this condition is not controlled, it may put you at risk for serious complications.  Your personal target blood pressure may vary depending on your medical conditions, your age, and other factors. For most people, a normal blood pressure is less than 120/80.  Hypertension is treated with lifestyle changes, medicines, or a combination of both. Lifestyle changes include losing weight, eating a healthy, low-sodium diet, exercising more, and limiting alcohol. This information is not intended to replace advice given to you by your health care provider. Make sure you discuss any questions you have with your health care  provider. Document Revised: 02/13/2018 Document Reviewed: 02/13/2018 Elsevier Patient Education  2020 [employer].

## 2019-07-15 ENCOUNTER — Ambulatory Visit (INDEPENDENT_AMBULATORY_CARE_PROVIDER_SITE_OTHER): Payer: BC Managed Care – PPO | Admitting: Neurology

## 2019-07-15 ENCOUNTER — Encounter: Payer: BC Managed Care – PPO | Admitting: Neurology

## 2019-07-15 ENCOUNTER — Other Ambulatory Visit: Payer: Self-pay

## 2019-07-15 DIAGNOSIS — Z0289 Encounter for other administrative examinations: Secondary | ICD-10-CM

## 2019-07-15 DIAGNOSIS — M059 Rheumatoid arthritis with rheumatoid factor, unspecified: Secondary | ICD-10-CM

## 2019-07-15 DIAGNOSIS — R29898 Other symptoms and signs involving the musculoskeletal system: Secondary | ICD-10-CM | POA: Diagnosis not present

## 2019-07-15 NOTE — Progress Notes (Signed)
Full Name: Laurie Hodge Gender: Female MRN #: [id number] Date of Birth: [birth year]    Visit Date: 07/15/2019 08:42 Age: 59 Years Examining Physician: Arlice Colt, MD  Referring Physician: Marella Chimes, PA    History: Ms. Joe is a 59 year old woman with rheumatoid arthritis who has noted weakness in the left hand.  She notes that her grasp seems worse than it used to be and she is dropping items frequently.  On examination, strength, sensation and reflexes were normal in the arms.  Nerve conduction studies:  Bilateral ulnar and the right median motor responses had normal distal latencies, amplitudes and conduction velocities.  The left median motor response had a amplitude though distal latency.  There may be an ulnar / median anastomosis as a similar amplitude response over the APB muscle was also evoked with stimulation of the left ulnar nerve.  Bilateral median and ulnar sensory responses had normal peak latencies and amplitudes.  Of note, the median sensory responses were symmetric  Electromyography: Needle EMG of selected muscles of the right arm and shoulder showed normal motor unit action potentials and recruitment.  There was no abnormal spontaneous activity.  Impression: This is likely a normal study of the arms.  The asymmetry of the median motor responses but with symmetry of the sensory responses is more likely due to congenital variations innervation then to a mononeuropathy or other disorder.  There is no evidence of radiculopathy.  Chibueze Beasley A. Felecia Shelling, MD, PhD, FAAN Certified in Neurology, Clinical Neurophysiology, Sleep Medicine, Pain Medicine and Neuroimaging Director, [facility] [address], [place], [place] [address] [phone]             [facility]    Nerve / Sites Muscle Latency Ref. Amplitude Ref. Rel Amp Segments Distance Velocity Ref. Area    ms ms mV mV %  cm  m/s m/s mVms  R Median - APB     Wrist APB 3.3 ?4.4 9.8 ?4.0 100 Wrist - APB 7   28.5     Upper arm APB 7.4  8.9  91.1 Upper arm - Wrist 22 54 ?49 26.4  L Median - APB     Wrist APB 2.3 ?4.4 1.1 ?4.0 100 Wrist - APB 7   2.2     Upper arm APB 8.0  1.1  101 Upper arm - Wrist 21 37 ?49 2.5     Palm APB 2.0  2.3  219 Palm - APB    3.4     8 Interossei 6.9  1.6  68.8 8 - Palm    4.0         8 - Upper arm      R Ulnar - ADM     Wrist ADM 2.5 ?3.3 7.8 ?6.0 100 Wrist - ADM 7   18.5     B.Elbow ADM 5.8  9.4  121 B.Elbow - Wrist 21 63 ?49 29.6     A.Elbow ADM 7.5  9.1  97.1 A.Elbow - B.Elbow 10 59 ?49 30.2         A.Elbow - Wrist      L Ulnar - ADM     Wrist ADM 2.9 ?3.3 5.1 ?6.0 100 Wrist - ADM 7   7.3     B.Elbow ADM 5.9  8.7  171 B.Elbow - Wrist 21 70 ?49 25.6     A.Elbow ADM 7.4  8.3  95.2  A.Elbow - B.Elbow 10 64 ?49 24.8         A.Elbow - Wrist                 SNC    Nerve / Sites Rec. Site Peak Lat Ref.  Amp Ref. Segments Distance Peak Diff Ref.    ms ms V V  cm ms ms  R Median, Ulnar - Transcarpal comparison     Median Palm Wrist 1.9 ?2.2 59 ?35 Median Palm - Wrist 8       Ulnar Palm Wrist 1.7 ?2.2 18 ?12 Ulnar Palm - Wrist 8          Median Palm - Ulnar Palm  0.2 ?0.4  L Median, Ulnar - Transcarpal comparison     Median Palm Wrist 1.9 ?2.2 62 ?35 Median Palm - Wrist 8       Ulnar Palm Wrist 1.7 ?2.2 20 ?12 Ulnar Palm - Wrist 8          Median Palm - Ulnar Palm  0.2 ?0.4  R Median - Orthodromic (Dig II, Mid palm)     Dig II Wrist 2.8 ?3.4 14 ?10 Dig II - Wrist 13    L Median - Orthodromic (Dig II, Mid palm)     Dig II Wrist 2.8 ?3.4 14 ?10 Dig II - Wrist 13    R Ulnar - Orthodromic, (Dig V, Mid palm)     Dig V Wrist 2.3 ?3.1 8 ?5 Dig V - Wrist 11    L Ulnar - Orthodromic, (Dig V, Mid palm)     Dig V Wrist 2.3 ?3.1 8 ?5 Dig V - Wrist 32                   F  Wave    Nerve F Lat Ref.   ms ms  R Ulnar - ADM 27.9 ?32.0  L Ulnar - ADM 28.6 ?32.0         EMG Summary Table     Spontaneous MUAP Recruitment  Muscle IA Fib PSW Fasc Other Amp Dur. Poly Pattern  L. Deltoid Normal None None None _______ Normal Normal Normal Normal  L. Triceps brachii Normal None None None _______ Normal Normal Normal Normal  L. Biceps brachii Normal None None None _______ Normal Normal Normal Normal  L. Extensor digitorum communis Normal None None None _______ Normal Normal Normal Normal  L. First dorsal interosseous Normal None None None _______ Normal Normal Normal Normal  L. Abductor pollicis brevis Normal None None None _______ Normal Normal Normal Normal

## 2019-07-31 ENCOUNTER — Ambulatory Visit: Payer: BC Managed Care – PPO | Attending: Internal Medicine

## 2019-07-31 ENCOUNTER — Ambulatory Visit: Payer: BC Managed Care – PPO

## 2019-07-31 DIAGNOSIS — Z23 Encounter for immunization: Secondary | ICD-10-CM | POA: Insufficient documentation

## 2019-07-31 NOTE — Progress Notes (Signed)
   Covid-19 Vaccination Clinic  Name:  Laurie Hodge    MRN: [id number] DOB: 1961-03-29  07/31/2019  [name] was observed post Covid-19 immunization for 15 minutes without incidence. She was provided with Vaccine Information Sheet and instruction to access the V-Safe system.   [name] was instructed to call 911 with any severe reactions post vaccine: Marland Kitchen Difficulty breathing  . Swelling of your face and throat  . A fast heartbeat  . A bad rash all over your body  . Dizziness and weakness    Immunizations Administered    Name Date Dose VIS Date Route   Pfizer COVID-19 Vaccine 07/31/2019  3:36 PM 0.3 mL 05/30/2019 Intramuscular   Manufacturer: [place]   Lot: [id number]   [place]: [id number]

## 2019-08-23 ENCOUNTER — Ambulatory Visit: Payer: BC Managed Care – PPO | Attending: Internal Medicine

## 2019-08-23 DIAGNOSIS — Z23 Encounter for immunization: Secondary | ICD-10-CM | POA: Insufficient documentation

## 2019-08-23 NOTE — Progress Notes (Signed)
   Covid-19 Vaccination Clinic  Name:  Laurie Hodge    MRN: [id number] DOB: July 05, 1960  08/23/2019  [name] was observed post Covid-19 immunization for 15 minutes without incident. She was provided with Vaccine Information Sheet and instruction to access the V-Safe system.   [name] was instructed to call 911 with any severe reactions post vaccine: Marland Kitchen Difficulty breathing  . Swelling of face and throat  . A fast heartbeat  . A bad rash all over body  . Dizziness and weakness   Immunizations Administered    Name Date Dose VIS Date Route   Pfizer COVID-19 Vaccine 08/23/2019  2:03 PM 0.3 mL 05/30/2019 Intramuscular   Manufacturer: [place]   Lot: [id number]   [place]: [id number]

## 2019-11-04 ENCOUNTER — Other Ambulatory Visit: Payer: Self-pay | Admitting: Emergency Medicine

## 2019-11-04 DIAGNOSIS — K0889 Other specified disorders of teeth and supporting structures: Secondary | ICD-10-CM

## 2019-11-04 NOTE — Telephone Encounter (Signed)
LVM for patient to return call regarding Augmentin antibiotic prescription request, pt may need to schedule an appointment or an appt with a dentist.

## 2019-11-04 NOTE — Telephone Encounter (Signed)
Requested medications are due for refill today?  Unknown.    Requested medications are on active medication list?  No   Last Refill:   07/07/2019   Future visit scheduled? Yes in two months.    Notes to Clinic:  Medication prescribed for patient 4 months ago for tooth ache.   May need an appointment or dentist.

## 2020-01-07 ENCOUNTER — Ambulatory Visit: Payer: BC Managed Care – PPO | Admitting: Emergency Medicine

## 2020-01-07 ENCOUNTER — Encounter: Payer: Self-pay | Admitting: Emergency Medicine

## 2020-01-07 ENCOUNTER — Other Ambulatory Visit: Payer: Self-pay

## 2020-01-07 VITALS — BP 155/89 | HR 66 | Temp 98.0°F | Resp 16 | Ht 65.5 in | Wt 256.0 lb

## 2020-01-07 DIAGNOSIS — E785 Hyperlipidemia, unspecified: Secondary | ICD-10-CM

## 2020-01-07 DIAGNOSIS — E66813 Obesity, class 3: Secondary | ICD-10-CM | POA: Insufficient documentation

## 2020-01-07 DIAGNOSIS — Z6841 Body Mass Index (BMI) 40.0 and over, adult: Secondary | ICD-10-CM

## 2020-01-07 DIAGNOSIS — I10 Essential (primary) hypertension: Secondary | ICD-10-CM

## 2020-01-07 DIAGNOSIS — B360 Pityriasis versicolor: Secondary | ICD-10-CM | POA: Insufficient documentation

## 2020-01-07 MED ORDER — KETOCONAZOLE 2 % EX CREA: 1.0000 "application " | TOPICAL_CREAM | Freq: Two times a day (BID) | CUTANEOUS | 3 refills | Status: AC

## 2020-01-07 MED ORDER — ATORVASTATIN CALCIUM 40 MG PO TABS: 40.0000 mg | ORAL_TABLET | Freq: Every day | ORAL | 3 refills | Status: DC

## 2020-01-07 MED ORDER — LISINOPRIL 40 MG PO TABS: 40.0000 mg | ORAL_TABLET | Freq: Every day | ORAL | 3 refills | Status: DC

## 2020-01-07 MED ORDER — HYDROCHLOROTHIAZIDE 12.5 MG PO TABS: 12.5000 mg | ORAL_TABLET | Freq: Every day | ORAL | 3 refills | Status: DC

## 2020-01-07 NOTE — Assessment & Plan Note (Signed)
Persistently elevated blood pressure.  Hypertension not optimally controlled.  Continue lisinopril 40 mg daily and add hydrochlorothiazide 12.5 mg daily.

## 2020-01-07 NOTE — Progress Notes (Signed)
Laurie Hodge 59 y.o.   Chief Complaint  Patient presents with  . Hypertension    follow up 6 month and noticed a rash on the neck 2-3 months   . Medication Refill    Atorvastatin and Lisinopril  Last visit 07/07/2019: HISTORY OF PRESENT ILLNESS: This is a 59 y.o. female with chronic medical problems here for follow-up and medication refill. 1.  History of hypertension: On lisinopril 40 mg daily.  Normal blood pressure readings at home. 2.  History of rheumatoid arthritis: Sees rheumatologist regularly.  On Humira and meloxicam as needed. 3.  History of dyslipidemia: On atorvastatin 40 mg daily. 4.  History of obstructive sleep apnea. 5.  History of iron deficiency anemia: On Niferex. Had recent blood work at rheumatologist office.  No concerns.  HISTORY OF PRESENT ILLNESS: This is a 59 y.o. female here for follow-up of chronic medical problems listed above. BP Readings from Last 3 Encounters:  01/07/20 (!) 155/89  07/07/19 (!) 150/84  01/08/19 (!) 168/90  Also complaining of rash on right side of her neck that started 3 months ago. Fully vaccinated against Covid  HPI   Prior to Admission medications   Medication Sig Start Date End Date Taking? Authorizing Provider  Adalimumab (HUMIRA PEN) 40 MG/0.8ML PNKT Inject 40 mg into the skin every 14 (fourteen) days. 07/06/15  Yes Laurie Hodge, [place], PA  atorvastatin (LIPITOR) 40 MG tablet Take 1 tablet (40 mg total) by mouth daily. 07/07/19  Yes Eleonor Hodge, Laurie Bloomer, MD  Biotin 5 MG CAPS Take by mouth.   Yes [provider]  cetirizine (ZYRTEC) 10 MG tablet Take 10 mg by mouth daily as needed for allergies.    Yes [provider]  cholecalciferol (VITAMIN D) 1000 UNITS tablet Take 2,000 Units by mouth daily.    Yes [provider]  Cyanocobalamin (VITAMIN B-12) 2500 MCG SUBL Place 2,500 mcg under the tongue daily.   Yes [provider]  docusate sodium (COLACE) 100 MG capsule Take 100 mg by mouth as  needed.    Yes [provider]  Fish Oil-Krill Oil (KRILL OIL PLUS) CAPS Take 750 mg by mouth daily.   Yes [provider]  iron polysaccharides (FERREX 150) 150 MG capsule TAKE ONE CAPSULE BY MOUTH TWICE DAILY. 07/07/19  Yes Laurie Hodge, Laurie Bloomer, MD  lisinopril (ZESTRIL) 40 MG tablet Take 1 tablet (40 mg total) by mouth daily. 07/07/19  Yes Laurie Hodge, Laurie Bloomer, MD  meloxicam (MOBIC) 7.5 MG tablet Take 1 tablet (7.5 mg total) by mouth as needed for pain. 09/25/17  Yes Laurie Mons, PA  Multiple Vitamin (MULTIVITAMIN WITH MINERALS) TABS tablet Take 1 tablet by mouth 2 (two) times daily.   Yes [provider]  omeprazole (PRILOSEC) 40 MG capsule Take 1 capsule (40 mg total) by mouth daily. 06/06/18  Yes Laurie Hodge, Laurie Bloomer, MD  senna (SENOKOT) 8.6 MG TABS tablet Take 2 tablets by mouth as needed for mild constipation. Reported on 10/12/2015   Yes [provider]  calcium-vitamin D (OSCAL WITH D) 500-200 MG-UNIT tablet Take 1 tablet by mouth 3 (three) times daily. Patient not taking: Reported on 01/07/2020    [provider]    Allergies  Allergen Reactions  . Adhesive [Tape] Rash    Skin Peels    Patient Active Problem List   Diagnosis Date Noted  . Left hand weakness 07/15/2019  . Osteoarthritis 02/05/2015  . Hyperlipidemia 11/06/2013  . Rheumatoid arthritis ([place]) 06/10/2013  . BMI 36.0-36.9,adult   .  GERD (gastroesophageal reflux disease)   . OSA (obstructive sleep apnea)     Class: Minor  . HTN (hypertension)   . Vitamin D insufficiency   . Iron (Fe) deficiency anemia     Past Medical History:  Diagnosis Date  . Allergy   . Arthritis    RA  . Depression   . GERD (gastroesophageal reflux disease)   . Hemorrhoid   . HSV-2 infection    IgG  . HTN (hypertension)   . Hyperlipidemia   . Iron (Fe) deficiency anemia   . Obesity, Class III, BMI 40-49.9 (morbid obesity) ([place])   . OSA on CPAP    cpap broke this week  . OSA on CPAP    . Ulcer    gastric - h. pylori positive  . Vitamin D insufficiency     Past Surgical History:  Procedure Laterality Date  . LAPAROSCOPIC GASTRIC SLEEVE RESECTION N/A 05/31/2015   Procedure: LAPAROSCOPIC GASTRIC SLEEVE RESECTION;  Surgeon: Laurie Bruce Kinsinger, MD;  Location: [facility];  Service: General;  Laterality: N/A;  . TUBAL LIGATION      Social History   Socioeconomic History  . Marital status: Divorced    Spouse name: n/a  . Number of children: 3  . Years of education: 15  . Highest education level: Not on file  Occupational History  . 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 Smoking status: Never Smoker  . Smokeless tobacco: Never Used  Vaping Use  . Vaping Use: Never used  Substance and Sexual Activity  . Alcohol use: Yes    Alcohol/week: 0.0 standard drinks    Comment: 2-3 times a year at special events  . Drug use: No  . Sexual activity: Not Currently    Partners: Male    Comment: not active since divorce from husband 2010  Other Topics Concern  . Not on file  Social History Narrative   Lives with daughter. Her mother who has dementia and had a stroke lives with her during the week and her siblings take turns staying with their mother on the weekends.      Denies caffeine use    Social Determinants of Radio broadcast assistant Strain:   . Difficulty of Paying Living Expenses:   Food Insecurity:   . Worried About Charity fundraiser in the Last Year:   . Arboriculturist in the Last Year:   Transportation Needs:   . Film/video editor (Medical):   Laurie Hodge Kitchen Lack of Transportation (Non-Medical):   Physical Activity:   . Days of Exercise per Week:   . Minutes of Exercise per Session:   Stress:   . Feeling of Stress :   Social Connections:   . Frequency of Communication with Friends and Family:   . Frequency of Social Gatherings with Friends and Family:   . Attends  Religious Services:   . Active Member of Clubs or Organizations:   . Attends Archivist Meetings:   Laurie Hodge Kitchen Marital Status:   Intimate Partner Violence:   . Fear of Current or Ex-Partner:   . Emotionally Abused:   Laurie Hodge Kitchen Physically Abused:   . Sexually Abused:     Family History  Problem Relation Age of Onset  . Diabetes Mother   . Hypertension Mother   . Dementia Mother   . Stroke Mother 40  . Hyperlipidemia Mother   . Cancer  Paternal Aunt        breast ca x 5 maternal aunts  . Diabetes Sister   . Heart disease Sister   . Hyperlipidemia Sister   . Hypertension Sister   . Heart disease Father   . Hyperlipidemia Father   . Hypertension Father   . Stroke Father   . Heart disease Sister   . Hyperlipidemia Sister   . Hypertension Sister   . Diabetes Brother   . Hyperlipidemia Brother   . Hypertension Brother   . Hypertension Son   . Colon cancer Neg Hx   . Esophageal cancer Neg Hx   . Rectal cancer Neg Hx   . Stomach cancer Neg Hx      Review of Systems  Constitutional: Negative.  Negative for chills and fever.  HENT: Negative.  Negative for congestion and sore throat.   Respiratory: Negative.  Negative for cough and shortness of breath.   Cardiovascular: Negative.  Negative for chest pain and palpitations.  Gastrointestinal: Negative for abdominal pain, diarrhea, nausea and vomiting.  Genitourinary: Negative.  Negative for dysuria and hematuria.  Musculoskeletal: Positive for joint pain.  Skin: Positive for itching and rash.       Right side of her neck  Neurological: Negative for dizziness and headaches.  All other systems reviewed and are negative.  Today's Vitals   01/07/20 1608  BP: (!) 155/89  Pulse: 66  Resp: 16  Temp: 98 F (36.7 C)  TempSrc: Temporal  SpO2: 97%  Weight: 256 lb (116.1 kg)  Height: 5' 5.5" (1.664 m)   Body mass index is 41.95 kg/m.   Physical Exam Vitals reviewed.  Constitutional:      Appearance: Normal appearance.  HENT:       Head: Normocephalic.  Eyes:     Extraocular Movements: Extraocular movements intact.     Pupils: Pupils are equal, round, and reactive to light.  Cardiovascular:     Rate and Rhythm: Normal rate and regular rhythm.     Pulses: Normal pulses.     Heart sounds: Normal heart sounds.  Pulmonary:     Effort: Pulmonary effort is normal.     Breath sounds: Normal breath sounds.  Musculoskeletal:     Cervical back: Normal range of motion and neck supple.     Right lower leg: No edema.     Left lower leg: No edema.  Skin:    Capillary Refill: Capillary refill takes less than 2 seconds.     Findings: Rash present.     Comments: Right side of her neck shows typical lesions of tinea versicolor  Neurological:     General: No focal deficit present.     Mental Status: She is alert and oriented to person, place, and time.  Psychiatric:        Mood and Affect: Mood normal.        Behavior: Behavior normal.      ASSESSMENT & PLAN: HTN (hypertension) Persistently elevated blood pressure.  Hypertension not optimally controlled.  Continue lisinopril 40 mg daily and add hydrochlorothiazide 12.5 mg daily.  Rekha was seen today for hypertension and medication refill.  Diagnoses and all orders for this visit:  Essential hypertension -     hydrochlorothiazide (HYDRODIURIL) 12.5 MG tablet; Take 1 tablet (12.5 mg total) by mouth daily. -     lisinopril (ZESTRIL) 40 MG tablet; Take 1 tablet (40 mg total) by mouth daily.  Hyperlipidemia, unspecified hyperlipidemia type -     atorvastatin (LIPITOR)  40 MG tablet; Take 1 tablet (40 mg total) by mouth daily.  Tinea versicolor -     ketoconazole (NIZORAL) 2 % cream; Apply 1 application topically 2 (two) times daily for 14 days.  Body mass index (BMI) of 40.1-44.9 in adult [facility])    Patient Instructions       If you have lab work done today you will be contacted with your lab results within the next 2 weeks.  If you have not heard from [place]  then please contact us. The fastest way to get your results is to register for My Chart.   IF you received an x-ray today, you will receive an invoice from [facility]. Please contact [facility] Radiology at [phone] with questions or concerns regarding your invoice.   IF you received labwork today, you will receive an invoice from [place]. Please contact [facility] at [phone] with questions or concerns regarding your invoice.   Our billing staff will not be able to assist you with questions regarding bills from these companies.  You will be contacted with the lab results as soon as they are available. The fastest way to get your results is to activate your My Chart account. Instructions are located on the last page of this paperwork. If you have not heard from [place] regarding the results in 2 weeks, please contact this office.     Tinea Versicolor  Tinea versicolor is a skin infection. It is caused by a type of yeast. It is normal for some yeast to be on your skin, but too much yeast causes this infection. The infection causes a rash of light or dark patches on your skin. The rash is most common on the chest, back, neck, or upper arms. The infection usually does not cause other problems. If it is treated, it will probably go away in a few weeks. The infection cannot be spread from one person to another (is not contagious). Follow these instructions at home:  Use over-the-counter and prescription medicines only as told by your doctor.  Scrub your skin every day with dandruff shampoo as told by your doctor.  Do not scratch your skin in the rash area.  Avoid places that are hot and humid.  Do not use tanning booths.  Try to avoid sweating a lot. Contact a doctor if:  Your symptoms get worse.  You have a fever.  You have redness, swelling, or pain in the rash area.  You have fluid or blood coming from your rash.  Your rash feels warm to the touch.  You have pus or  a bad smell coming from your rash.  Your rash comes back (recurs) after treatment. Summary  Tinea versicolor is a skin infection. It causes a rash of light or dark patches on your skin.  The rash is most common on the chest, back, neck, or upper arms. This infection usually does not cause other problems.  Use over-the-counter and prescription medicines only as told by your doctor.  If the infection is treated, it will probably go away in a few weeks. This information is not intended to replace advice given to you by your health care provider. Make sure you discuss any questions you have with your health care provider. Document Revised: 05/18/2017 Document Reviewed: 02/05/2017 Elsevier Patient Education  [place].  Hypertension, Adult High blood pressure (hypertension) is when the force of blood pumping through the arteries is too strong. The arteries are the blood vessels that carry blood from the  heart throughout the body. Hypertension forces the heart to work harder to pump blood and may cause arteries to become narrow or stiff. Untreated or uncontrolled hypertension can cause a heart attack, heart failure, a stroke, kidney disease, and other problems. A blood pressure reading consists of a higher number over a lower number. Ideally, your blood pressure should be below 120/80. The first ("top") number is called the systolic pressure. It is a measure of the pressure in your arteries as your heart beats. The second ("bottom") number is called the diastolic pressure. It is a measure of the pressure in your arteries as the heart relaxes. What are the causes? The exact cause of this condition is not known. There are some conditions that result in or are related to high blood pressure. What increases the risk? Some risk factors for high blood pressure are under your control. The following factors may make you more likely to develop this condition:  Smoking.  Having type 2 diabetes  mellitus, high cholesterol, or both.  Not getting enough exercise or physical activity.  Being overweight.  Having too much fat, sugar, calories, or salt (sodium) in your diet.  Drinking too much alcohol. Some risk factors for high blood pressure may be difficult or impossible to change. Some of these factors include:  Having chronic kidney disease.  Having a family history of high blood pressure.  Age. Risk increases with age.  Race. You may be at higher risk if you are African American.  Gender. Men are at higher risk than women before age 80. After age 72, women are at higher risk than men.  Having obstructive sleep apnea.  Stress. What are the signs or symptoms? High blood pressure may not cause symptoms. Very high blood pressure (hypertensive crisis) may cause:  Headache.  Anxiety.  Shortness of breath.  Nosebleed.  Nausea and vomiting.  Vision changes.  Severe chest pain.  Seizures. How is this diagnosed? This condition is diagnosed by measuring your blood pressure while you are seated, with your arm resting on a flat surface, your legs uncrossed, and your feet flat on the floor. The cuff of the blood pressure monitor will be placed directly against the skin of your upper arm at the level of your heart. It should be measured at least twice using the same arm. Certain conditions can cause a difference in blood pressure between your right and left arms. Certain factors can cause blood pressure readings to be lower or higher than normal for a short period of time:  When your blood pressure is higher when you are in a health care provider's office than when you are at home, this is called white coat hypertension. Most people with this condition do not need medicines.  When your blood pressure is higher at home than when you are in a health care provider's office, this is called masked hypertension. Most people with this condition may need medicines to control blood  pressure. If you have a high blood pressure reading during one visit or you have normal blood pressure with other risk factors, you may be asked to:  Return on a different day to have your blood pressure checked again.  Monitor your blood pressure at home for 1 week or longer. If you are diagnosed with hypertension, you may have other blood or imaging tests to help your health care provider understand your overall risk for other conditions. How is this treated? This condition is treated by making healthy lifestyle changes, such  as eating healthy foods, exercising more, and reducing your alcohol intake. Your health care provider may prescribe medicine if lifestyle changes are not enough to get your blood pressure under control, and if:  Your systolic blood pressure is above 130.  Your diastolic blood pressure is above 80. Your personal target blood pressure may vary depending on your medical conditions, your age, and other factors. Follow these instructions at home: Eating and drinking   Eat a diet that is high in fiber and potassium, and low in sodium, added sugar, and fat. An example eating plan is called the DASH (Dietary Approaches to Stop Hypertension) diet. To eat this way: ? Eat plenty of fresh fruits and vegetables. Try to fill one half of your plate at each meal with fruits and vegetables. ? Eat whole grains, such as whole-wheat pasta, Pospisil rice, or whole-grain bread. Fill about one fourth of your plate with whole grains. ? Eat or drink low-fat dairy products, such as skim milk or low-fat yogurt. ? Avoid fatty cuts of meat, processed or cured meats, and poultry with skin. Fill about one fourth of your plate with lean proteins, such as fish, chicken without skin, beans, eggs, or tofu. ? Avoid pre-made and processed foods. These tend to be higher in sodium, added sugar, and fat.  Reduce your daily sodium intake. Most people with hypertension should eat less than 1,500 mg of sodium a  day.  Do not drink alcohol if: ? Your health care provider tells you not to drink. ? You are pregnant, may be pregnant, or are planning to become pregnant.  If you drink alcohol: ? Limit how much you use to:  0-1 drink a day for women.  0-2 drinks a day for men. ? Be aware of how much alcohol is in your drink. In the [place] one drink equals one 12 oz bottle of beer (355 mL), one 5 oz glass of wine (148 mL), or one 1 oz glass of hard liquor (44 mL). Lifestyle   Work with your health care provider to maintain a healthy body weight or to lose weight. Ask what an ideal weight is for you.  Get at least 30 minutes of exercise most days of the week. Activities may include walking, swimming, or biking.  Include exercise to strengthen your muscles (resistance exercise), such as Pilates or lifting weights, as part of your weekly exercise routine. Try to do these types of exercises for 30 minutes at least 3 days a week.  Do not use any products that contain nicotine or tobacco, such as cigarettes, e-cigarettes, and chewing tobacco. If you need help quitting, ask your health care provider.  Monitor your blood pressure at home as told by your health care provider.  Keep all follow-up visits as told by your health care provider. This is important. Medicines  Take over-the-counter and prescription medicines only as told by your health care provider. Follow directions carefully. Blood pressure medicines must be taken as prescribed.  Do not skip doses of blood pressure medicine. Doing this puts you at risk for problems and can make the medicine less effective.  Ask your health care provider about side effects or reactions to medicines that you should watch for. Contact a health care provider if you:  Think you are having a reaction to a medicine you are taking.  Have headaches that keep coming back (recurring).  Feel dizzy.  Have swelling in your ankles.  Have trouble with your  vision. Get help right away  if you:  Develop a severe headache or confusion.  Have unusual weakness or numbness.  Feel faint.  Have severe pain in your chest or abdomen.  Vomit repeatedly.  Have trouble breathing. Summary  Hypertension is when the force of blood pumping through your arteries is too strong. If this condition is not controlled, it may put you at risk for serious complications.  Your personal target blood pressure may vary depending on your medical conditions, your age, and other factors. For most people, a normal blood pressure is less than 120/80.  Hypertension is treated with lifestyle changes, medicines, or a combination of both. Lifestyle changes include losing weight, eating a healthy, low-sodium diet, exercising more, and limiting alcohol. This information is not intended to replace advice given to you by your health care provider. Make sure you discuss any questions you have with your health care provider. Document Revised: 02/13/2018 Document Reviewed: 02/13/2018 Elsevier Patient Education  2020 Elsevier Inc.      Agustina Caroli, MD [facility]

## 2020-01-07 NOTE — Patient Instructions (Addendum)
If you have lab work done today you will be contacted with your lab results within the next 2 weeks.  If you have not heard from [place] then please contact us. The fastest way to get your results is to register for My Chart.   IF you received an x-ray today, you will receive an invoice from [facility]. Please contact [facility] at [phone] with questions or concerns regarding your invoice.   IF you received labwork today, you will receive an invoice from [facility]. Please contact [facility] at [phone] with questions or concerns regarding your invoice.   Our billing staff will not be able to assist you with questions regarding bills from these companies.  You will be contacted with the lab results as soon as they are available. The fastest way to get your results is to activate your My Chart account. Instructions are located on the last page of this paperwork. If you have not heard from [place] regarding the results in 2 weeks, please contact this office.     Tinea Versicolor  Tinea versicolor is a skin infection. It is caused by a type of yeast. It is normal for some yeast to be on your skin, but too much yeast causes this infection. The infection causes a rash of light or dark patches on your skin. The rash is most common on the chest, back, neck, or upper arms. The infection usually does not cause other problems. If it is treated, it will probably go away in a few weeks. The infection cannot be spread from one person to another (is not contagious). Follow these instructions at home:  Use over-the-counter and prescription medicines only as told by your doctor.  Scrub your skin every day with dandruff shampoo as told by your doctor.  Do not scratch your skin in the rash area.  Avoid places that are hot and humid.  Do not use tanning booths.  Try to avoid sweating a lot. Contact a doctor if:  Your symptoms get worse.  You have a fever.  You have redness,  swelling, or pain in the rash area.  You have fluid or blood coming from your rash.  Your rash feels warm to the touch.  You have pus or a bad smell coming from your rash.  Your rash comes back (recurs) after treatment. Summary  Tinea versicolor is a skin infection. It causes a rash of light or dark patches on your skin.  The rash is most common on the chest, back, neck, or upper arms. This infection usually does not cause other problems.  Use over-the-counter and prescription medicines only as told by your doctor.  If the infection is treated, it will probably go away in a few weeks. This information is not intended to replace advice given to you by your health care provider. Make sure you discuss any questions you have with your health care provider. Document Revised: 05/18/2017 Document Reviewed: 02/05/2017 Elsevier Patient Education  [place].  Hypertension, Adult High blood pressure (hypertension) is when the force of blood pumping through the arteries is too strong. The arteries are the blood vessels that carry blood from the heart throughout the body. Hypertension forces the heart to work harder to pump blood and may cause arteries to become narrow or stiff. Untreated or uncontrolled hypertension can cause a heart attack, heart failure, a stroke, kidney disease, and other problems. A blood pressure reading consists of a higher number over a lower number. Ideally,  your blood pressure should be below 120/80. The first ("top") number is called the systolic pressure. It is a measure of the pressure in your arteries as your heart beats. The second ("bottom") number is called the diastolic pressure. It is a measure of the pressure in your arteries as the heart relaxes. What are the causes? The exact cause of this condition is not known. There are some conditions that result in or are related to high blood pressure. What increases the risk? Some risk factors for high blood  pressure are under your control. The following factors may make you more likely to develop this condition:  Smoking.  Having type 2 diabetes mellitus, high cholesterol, or both.  Not getting enough exercise or physical activity.  Being overweight.  Having too much fat, sugar, calories, or salt (sodium) in your diet.  Drinking too much alcohol. Some risk factors for high blood pressure may be difficult or impossible to change. Some of these factors include:  Having chronic kidney disease.  Having a family history of high blood pressure.  Age. Risk increases with age.  Race. You may be at higher risk if you are African American.  Gender. Men are at higher risk than women before age 23. After age 43, women are at higher risk than men.  Having obstructive sleep apnea.  Stress. What are the signs or symptoms? High blood pressure may not cause symptoms. Very high blood pressure (hypertensive crisis) may cause:  Headache.  Anxiety.  Shortness of breath.  Nosebleed.  Nausea and vomiting.  Vision changes.  Severe chest pain.  Seizures. How is this diagnosed? This condition is diagnosed by measuring your blood pressure while you are seated, with your arm resting on a flat surface, your legs uncrossed, and your feet flat on the floor. The cuff of the blood pressure monitor will be placed directly against the skin of your upper arm at the level of your heart. It should be measured at least twice using the same arm. Certain conditions can cause a difference in blood pressure between your right and left arms. Certain factors can cause blood pressure readings to be lower or higher than normal for a short period of time:  When your blood pressure is higher when you are in a health care provider's office than when you are at home, this is called white coat hypertension. Most people with this condition do not need medicines.  When your blood pressure is higher at home than when you  are in a health care provider's office, this is called masked hypertension. Most people with this condition may need medicines to control blood pressure. If you have a high blood pressure reading during one visit or you have normal blood pressure with other risk factors, you may be asked to:  Return on a different day to have your blood pressure checked again.  Monitor your blood pressure at home for 1 week or longer. If you are diagnosed with hypertension, you may have other blood or imaging tests to help your health care provider understand your overall risk for other conditions. How is this treated? This condition is treated by making healthy lifestyle changes, such as eating healthy foods, exercising more, and reducing your alcohol intake. Your health care provider may prescribe medicine if lifestyle changes are not enough to get your blood pressure under control, and if:  Your systolic blood pressure is above 130.  Your diastolic blood pressure is above 80. Your personal target blood pressure may  vary depending on your medical conditions, your age, and other factors. Follow these instructions at home: Eating and drinking   Eat a diet that is high in fiber and potassium, and low in sodium, added sugar, and fat. An example eating plan is called the DASH (Dietary Approaches to Stop Hypertension) diet. To eat this way: ? Eat plenty of fresh fruits and vegetables. Try to fill one half of your plate at each meal with fruits and vegetables. ? Eat whole grains, such as whole-wheat pasta, Guerin rice, or whole-grain bread. Fill about one fourth of your plate with whole grains. ? Eat or drink low-fat dairy products, such as skim milk or low-fat yogurt. ? Avoid fatty cuts of meat, processed or cured meats, and poultry with skin. Fill about one fourth of your plate with lean proteins, such as fish, chicken without skin, beans, eggs, or tofu. ? Avoid pre-made and processed foods. These tend to be  higher in sodium, added sugar, and fat.  Reduce your daily sodium intake. Most people with hypertension should eat less than 1,500 mg of sodium a day.  Do not drink alcohol if: ? Your health care provider tells you not to drink. ? You are pregnant, may be pregnant, or are planning to become pregnant.  If you drink alcohol: ? Limit how much you use to:  0-1 drink a day for women.  0-2 drinks a day for men. ? Be aware of how much alcohol is in your drink. In the [place] one drink equals one 12 oz bottle of beer (355 mL), one 5 oz glass of wine (148 mL), or one 1 oz glass of hard liquor (44 mL). Lifestyle   Work with your health care provider to maintain a healthy body weight or to lose weight. Ask what an ideal weight is for you.  Get at least 30 minutes of exercise most days of the week. Activities may include walking, swimming, or biking.  Include exercise to strengthen your muscles (resistance exercise), such as Pilates or lifting weights, as part of your weekly exercise routine. Try to do these types of exercises for 30 minutes at least 3 days a week.  Do not use any products that contain nicotine or tobacco, such as cigarettes, e-cigarettes, and chewing tobacco. If you need help quitting, ask your health care provider.  Monitor your blood pressure at home as told by your health care provider.  Keep all follow-up visits as told by your health care provider. This is important. Medicines  Take over-the-counter and prescription medicines only as told by your health care provider. Follow directions carefully. Blood pressure medicines must be taken as prescribed.  Do not skip doses of blood pressure medicine. Doing this puts you at risk for problems and can make the medicine less effective.  Ask your health care provider about side effects or reactions to medicines that you should watch for. Contact a health care provider if you:  Think you are having a reaction to a medicine you  are taking.  Have headaches that keep coming back (recurring).  Feel dizzy.  Have swelling in your ankles.  Have trouble with your vision. Get help right away if you:  Develop a severe headache or confusion.  Have unusual weakness or numbness.  Feel faint.  Have severe pain in your chest or abdomen.  Vomit repeatedly.  Have trouble breathing. Summary  Hypertension is when the force of blood pumping through your arteries is too strong. If this condition is not  controlled, it may put you at risk for serious complications.  Your personal target blood pressure may vary depending on your medical conditions, your age, and other factors. For most people, a normal blood pressure is less than 120/80.  Hypertension is treated with lifestyle changes, medicines, or a combination of both. Lifestyle changes include losing weight, eating a healthy, low-sodium diet, exercising more, and limiting alcohol. This information is not intended to replace advice given to you by your health care provider. Make sure you discuss any questions you have with your health care provider. Document Revised: 02/13/2018 Document Reviewed: 02/13/2018 Elsevier Patient Education  2020 [employer].

## 2020-01-18 ENCOUNTER — Other Ambulatory Visit: Payer: Self-pay | Admitting: Emergency Medicine

## 2020-01-18 DIAGNOSIS — K219 Gastro-esophageal reflux disease without esophagitis: Secondary | ICD-10-CM

## 2020-01-18 NOTE — Telephone Encounter (Signed)
Requested Prescriptions  Pending Prescriptions Disp Refills   omeprazole (PRILOSEC) 40 MG capsule [Pharmacy Med Name: Omeprazole 40 MG Oral Capsule Delayed Release] 90 capsule 0    Sig: TAKE 1 CAPSULE BY MOUTH  DAILY     Gastroenterology: Proton Pump Inhibitors Passed - 01/18/2020 10:54 AM      Passed - Valid encounter within last 12 months    Recent Outpatient Visits          1 week ago Essential hypertension   Primary Care at [facility], [name], MD   6 months ago Essential hypertension   Primary Care at [place], [name], MD   1 year ago Encounter for general adult medical examination with abnormal findings   Primary Care at [facility], [name], MD   1 year ago Essential hypertension   Primary Care at [place], [place], [place]   2 years ago Hyperlipidemia, unspecified hyperlipidemia type   Primary Care at [facility], [place], [place]      Future Appointments            In 5 months Sagard[place], [name], MD Primary Care at [place], [facility]

## 2020-04-16 ENCOUNTER — Encounter: Payer: Self-pay | Admitting: *Deleted

## 2020-04-23 ENCOUNTER — Encounter: Payer: Self-pay | Admitting: Internal Medicine

## 2020-04-23 ENCOUNTER — Ambulatory Visit (INDEPENDENT_AMBULATORY_CARE_PROVIDER_SITE_OTHER): Payer: BC Managed Care – PPO | Admitting: Internal Medicine

## 2020-04-23 VITALS — BP 150/96 | HR 60 | Ht 65.25 in | Wt 256.1 lb

## 2020-04-23 DIAGNOSIS — K594 Anal spasm: Secondary | ICD-10-CM

## 2020-04-23 DIAGNOSIS — Z8639 Personal history of other endocrine, nutritional and metabolic disease: Secondary | ICD-10-CM

## 2020-04-23 DIAGNOSIS — K649 Unspecified hemorrhoids: Secondary | ICD-10-CM | POA: Diagnosis not present

## 2020-04-23 DIAGNOSIS — K219 Gastro-esophageal reflux disease without esophagitis: Secondary | ICD-10-CM

## 2020-04-23 DIAGNOSIS — K648 Other hemorrhoids: Secondary | ICD-10-CM | POA: Diagnosis not present

## 2020-04-23 DIAGNOSIS — R194 Change in bowel habit: Secondary | ICD-10-CM

## 2020-04-23 DIAGNOSIS — K5900 Constipation, unspecified: Secondary | ICD-10-CM

## 2020-04-23 DIAGNOSIS — R1030 Lower abdominal pain, unspecified: Secondary | ICD-10-CM

## 2020-04-23 MED ORDER — SUTAB 1479-225-188 MG PO TABS: 1.0000 | ORAL_TABLET | ORAL | 0 refills | Status: DC

## 2020-04-23 MED ORDER — DILTIAZEM GEL 2 %: CUTANEOUS | 0 refills | Status: DC

## 2020-04-23 NOTE — Patient Instructions (Addendum)
You have been scheduled for a colonoscopy. Please follow written instructions given to you at your visit today.  Please pick up your prep supplies at the pharmacy within the next 1-3 days. If you use inhalers (even only as needed), please bring them with you on the day of your procedure.  Please purchase the following medications over the counter and take as directed: Metamucil OR Citrucel as per box instructions (you can do this AFTER colonoscopy)  We have sent a prescription for Diltiazem 2% gel to [facility] for you. Using your index finger, you should apply a small amount of medication inside the rectum up to your first knuckle/joint twice daily x 4 weeks.  [facility]'s information is below: Address: [address] [place], [place] [address]  [phone]  *Please DO NOT go directly from our office to pick up this medication! Give the pharmacy 1 day to process the prescription as this is compounded and takes time to make.  If you are age 57 or younger, your body mass index should be between 19-25. Your Body mass index is 42.3 kg/m. If this is out of the aformentioned range listed, please consider follow up with your Primary Care Provider.   Due to recent changes in healthcare laws, you may see the results of your imaging and laboratory studies on MyChart before your provider has had a chance to review them.  We understand that in some cases there may be results that are confusing or concerning to you. Not all laboratory results come back in the same time frame and the provider may be waiting for multiple results in order to interpret others.  Please give [place] 48 hours in order for your provider to thoroughly review all the results before contacting the office for clarification of your results.

## 2020-04-23 NOTE — Progress Notes (Signed)
Patient ID: Laurie Hodge, female   DOB: 1960/07/28, 59 y.o.   MRN: [id number] HPI: Laurie Hodge is a 59 year old female with a past medical history of iron deficiency anemia, obesity status post gastric sleeve with hiatal hernia repair in 2016, rheumatoid arthritis on Humira, hypertension, hyperlipidemia, sleep apnea who is seen in consult at the request of Dr. Mitchel Honour to evaluate alternating bowel habits and rectal bleeding.  She is here alone today.  She is known to me from 7 years ago and we did an evaluation for iron deficiency anemia.  She had an upper endoscopy and colonoscopy performed in August 2014. EGD revealed 2 cm hiatal hernia but was otherwise normal.  Biopsies from the stomach showed inactive gastritis without H. pylori. Colonoscopy to the cecum revealed a 3 mm sigmoid polyp which was found to be hyperplastic, and large hemorrhoids.  Most recently she reports that she has been dealing with predominantly constipation though at other times she will have looser stools particularly when she eats higher fiber foods such as fruits and vegetables.  She has bleeding with each bowel movement though not always heavy rectal bleeding.  She does have issues with fecal smearing.  She reports at times she will have lower back and buttock pain which can radiate down her legs left greater than right.  She also at times can feel a deep throbbing pain in her pelvis.  The symptoms had been fairly infrequent and occasional and now they are occurring at least once per week.  She is having a daily bowel movement though at times they are hard or large and they can feel incomplete.  She reports at times she is late for work because of her issues with toileting.  She uses omeprazole now only occasionally for flares of heartburn but heartburn is not an ongoing issue.  She denies dysphagia and odynophagia.  She has been on oral iron daily since 2014.  Her blood counts have recently been normal.  She has tried  senna but is not taking this now and at times Colace seem to make her symptoms worse.  Hemoglobin recently 11.2 with MCV 85  She continues Humira for her rheumatoid arthritis which she has found helpful.  Past Medical History:  Diagnosis Date  . Allergy   . Anemia   . Arthritis    RA  . Depression   . External hemorrhoids   . GERD (gastroesophageal reflux disease)   . H. pylori infection    many years ago  . Hemorrhoid   . Hiatal hernia   . HSV-2 infection    IgG  . HTN (hypertension)   . Hyperlipidemia   . Hyperplastic colon polyp   . Iron (Fe) deficiency anemia   . Obesity, Class III, BMI 40-49.9 (morbid obesity) ([place])   . OSA on CPAP    cpap broke this week  . Ulcer    gastric - h. pylori positive  . Vitamin D insufficiency     Past Surgical History:  Procedure Laterality Date  . LAPAROSCOPIC GASTRIC SLEEVE RESECTION N/A 05/31/2015   Procedure: LAPAROSCOPIC GASTRIC SLEEVE RESECTION;  Surgeon: Arta Bruce Kinsinger, MD;  Location: [facility];  Service: General;  Laterality: N/A;  . TUBAL LIGATION      Outpatient Medications Prior to Visit  Medication Sig Dispense Refill  . Adalimumab (HUMIRA PEN) 40 MG/0.8ML PNKT Inject 40 mg into the skin every 14 (fourteen) days. 2 each 3  . atorvastatin (LIPITOR) 40 MG tablet Take 1 tablet (40  mg total) by mouth daily. 90 tablet 3  . Biotin 5 MG CAPS Take by mouth.    . calcium-vitamin D (OSCAL WITH D) 500-200 MG-UNIT tablet Take 1 tablet by mouth 3 (three) times daily.     . cetirizine (ZYRTEC) 10 MG tablet Take 10 mg by mouth daily as needed for allergies.     . cholecalciferol (VITAMIN D) 1000 UNITS tablet Take 2,000 Units by mouth daily.     . Cyanocobalamin (VITAMIN B-12) 2500 MCG SUBL Place 2,500 mcg under the tongue daily.    . Fish Oil-Krill Oil (KRILL OIL PLUS) CAPS Take 750 mg by mouth daily.    . hydrochlorothiazide (HYDRODIURIL) 12.5 MG tablet Take 1 tablet (12.5 mg total) by mouth daily. 90 tablet 3  . iron  polysaccharides (FERREX 150) 150 MG capsule TAKE ONE CAPSULE BY MOUTH TWICE DAILY. 180 capsule 3  . lisinopril (ZESTRIL) 40 MG tablet Take 1 tablet (40 mg total) by mouth daily. 90 tablet 3  . meloxicam (MOBIC) 7.5 MG tablet Take 1 tablet (7.5 mg total) by mouth as needed for pain. 30 tablet 1  . Multiple Vitamin (MULTIVITAMIN WITH MINERALS) TABS tablet Take 1 tablet by mouth 2 (two) times daily.    Marland Kitchen senna (SENOKOT) 8.6 MG TABS tablet Take 2 tablets by mouth as needed for mild constipation. Reported on 10/12/2015    . omeprazole (PRILOSEC) 40 MG capsule TAKE 1 CAPSULE BY MOUTH  DAILY (Patient not taking: Reported on 04/23/2020) 90 capsule 0  . docusate sodium (COLACE) 100 MG capsule Take 100 mg by mouth as needed.      No facility-administered medications prior to visit.    Allergies  Allergen Reactions  . Adhesive [Tape] Rash    Skin Peels    Family History  Problem Relation Age of Onset  . Diabetes Mother   . Hypertension Mother   . Dementia Mother   . Stroke Mother 60  . Hyperlipidemia Mother   . Cancer Paternal Aunt        breast ca x 5 maternal aunts  . Diabetes Sister   . Heart disease Sister   . Hyperlipidemia Sister   . Hypertension Sister   . Heart disease Father   . Hyperlipidemia Father   . Hypertension Father   . Stroke Father   . Heart disease Sister   . Hyperlipidemia Sister   . Hypertension Sister   . Diabetes Brother   . Hyperlipidemia Brother   . Hypertension Brother   . Hypertension Son   . Colon cancer Neg Hx   . Esophageal cancer Neg Hx   . Rectal cancer Neg Hx   . Stomach cancer Neg Hx     Social History   Tobacco Use  . Smoking status: Never Smoker  . Smokeless tobacco: Never Used  Vaping Use  . Vaping Use: Never used  Substance Use Topics  . Alcohol use: Yes    Alcohol/week: 0.0 standard drinks    Comment: 2-3 times a year at special events  . Drug use: No    ROS: As per history of present illness, otherwise negative  BP (!) 150/96  (BP Location: Left Arm, Patient Position: Sitting, Cuff Size: Normal)   Pulse 60   Ht 5' 5.25" (1.657 m)   Wt 256 lb 2 oz (116.2 kg)   LMP 12/26/2018   BMI 42.30 kg/m   Gen: awake, alert, NAD HEENT: anicteric CV: RRR, no mrg Pulm: CTA b/l Abd: soft, NT/ND, +BS throughout Rectal:  Grade 3 internal hemorrhoids with small to medium perianal skin tags, no obvious fissure, no rectal mass and rectal vault empty of formed stool Ext: no c/c/e Neuro: nonfocal   RELEVANT LABS AND IMAGING: CBC    Component Value Date/Time   WBC 6.1 01/08/2019 1446   WBC 4.4 02/29/2016 1133   RBC 4.02 01/08/2019 1446   RBC 4.59 02/29/2016 1133   HGB 11.2 01/08/2019 1446   HCT 34.2 01/08/2019 1446   PLT 404 01/08/2019 1446   MCV 85 01/08/2019 1446   MCH 27.9 01/08/2019 1446   MCH 20.7 (L) 02/29/2016 1133   MCHC 32.7 01/08/2019 1446   MCHC 29.1 (L) 02/29/2016 1133   RDW 13.1 01/08/2019 1446   LYMPHSABS 2.2 01/08/2019 1446   MONOABS 484 02/29/2016 1133   EOSABS 0.2 01/08/2019 1446   BASOSABS 0.1 01/08/2019 1446    CMP     Component Value Date/Time   NA 140 01/08/2019 1446   K 4.2 01/08/2019 1446   CL 99 01/08/2019 1446   CO2 24 01/08/2019 1446   GLUCOSE 79 01/08/2019 1446   GLUCOSE 77 02/29/2016 1133   BUN 9 01/08/2019 1446   CREATININE 0.61 01/08/2019 1446   CREATININE 0.60 02/29/2016 1133   CALCIUM 9.0 01/08/2019 1446   PROT 7.5 01/08/2019 1446   ALBUMIN 4.2 01/08/2019 1446   AST 20 01/08/2019 1446   ALT 24 01/08/2019 1446   ALKPHOS 88 01/08/2019 1446   BILITOT 0.6 01/08/2019 1446   GFRNONAA 102 01/08/2019 1446   GFRNONAA >89 10/02/2013 1400   GFRAA 117 01/08/2019 1446   GFRAA >89 10/02/2013 1400    ASSESSMENT/PLAN: 59 year old female with a past medical history of iron deficiency anemia, obesity status post gastric sleeve with hiatal hernia repair in 2016, rheumatoid arthritis on Humira, hypertension, hyperlipidemia, sleep apnea who is seen in consult at the request of Dr.  Mitchel Honour to evaluate alternating bowel habits and rectal bleeding.   1.  Alternating bowel habits with constipation predominance/abdominal discomfort/prolapsed and bleeding grade 3 internal hemorrhoids --we discussed her symptoms today which are all interrelated.  Her internal hemorrhoids are prolapsing and leading to frequent rectal bleeding as well as fecal smearing.  Constipation is predominant though loose stools can occur.  Given that it has been over 7 years since her last colonoscopy I feel it best to repeat colonoscopy in exclude other pathology.  This will be scheduled and also recommended the following: --Repeat colonoscopy; we discussed the risk, benefits and alternatives and she is agreeable and wishes to proceed --Begin Metamucil or Citrucel daily to help prevent constipation, improve incomplete evacuation, and hopefully improve fecal smearing --Diltiazem gel 2% twice daily as needed for anorectal spasm --After colonoscopy hemorrhoidal banding is recommended.  She does have large grade 3 hemorrhoids and we discussed how it is possible that banding will not be completely effective.  If this is the case surgical hemorrhoidectomy would be recommended.  2.  History of IDA --not an issue of late though she has continued oral iron supplementation.  She will continue this daily  3.  GERD/prior gastric sleeve --GERD less of an issue with weight loss though she has gained back some of the 90 pounds she was able to lose with gastric sleeve.  She is only using omeprazole intermittently now.  No alarm symptoms to warrant recommending EGD      HC:[id number], [address], [place],  [place] [address]

## 2020-04-26 ENCOUNTER — Encounter: Payer: Self-pay | Admitting: Internal Medicine

## 2020-04-26 ENCOUNTER — Other Ambulatory Visit: Payer: Self-pay

## 2020-04-26 ENCOUNTER — Ambulatory Visit (AMBULATORY_SURGERY_CENTER): Payer: BC Managed Care – PPO | Admitting: Internal Medicine

## 2020-04-26 VITALS — BP 138/82 | HR 53 | Temp 96.9°F | Resp 24 | Ht 65.0 in | Wt 256.0 lb

## 2020-04-26 DIAGNOSIS — D123 Benign neoplasm of transverse colon: Secondary | ICD-10-CM

## 2020-04-26 DIAGNOSIS — R194 Change in bowel habit: Secondary | ICD-10-CM | POA: Diagnosis not present

## 2020-04-26 DIAGNOSIS — K649 Unspecified hemorrhoids: Secondary | ICD-10-CM

## 2020-04-26 DIAGNOSIS — R1030 Lower abdominal pain, unspecified: Secondary | ICD-10-CM

## 2020-04-26 DIAGNOSIS — K625 Hemorrhage of anus and rectum: Secondary | ICD-10-CM | POA: Diagnosis not present

## 2020-04-26 DIAGNOSIS — K648 Other hemorrhoids: Secondary | ICD-10-CM

## 2020-04-26 DIAGNOSIS — K635 Polyp of colon: Secondary | ICD-10-CM | POA: Diagnosis not present

## 2020-04-26 DIAGNOSIS — K639 Disease of intestine, unspecified: Secondary | ICD-10-CM

## 2020-04-26 MED ORDER — SODIUM CHLORIDE 0.9 % IV SOLN: 500.0000 mL | INTRAVENOUS | Status: DC

## 2020-04-26 NOTE — Op Note (Signed)
[facility] Patient Name: Laurie Hodge Procedure Date: 04/26/2020 3:41 PM MRN: [id number] Endoscopist: Jerene Bears , MD Age: 59 Referring MD:  Date of Birth: September 30, 1960 Gender: Female Account #: [id number] Procedure:                Colonoscopy Indications:              Rectal bleeding, alternating bowel habits,                            abdominal pain, hemorrhoids Medicines:                Monitored Anesthesia Care Procedure:                Pre-Anesthesia Assessment:                           - Prior to the procedure, a History and Physical                            was performed, and patient medications and                            allergies were reviewed. The patient's tolerance of                            previous anesthesia was also reviewed. The risks                            and benefits of the procedure and the sedation                            options and risks were discussed with the patient.                            All questions were answered, and informed consent                            was obtained. Prior Anticoagulants: The patient has                            taken no previous anticoagulant or antiplatelet                            agents. ASA Grade Assessment: III - A patient with                            severe systemic disease. After reviewing the risks                            and benefits, the patient was deemed in                            satisfactory condition to undergo the procedure.  After obtaining informed consent, the colonoscope                            was passed under direct vision. Throughout the                            procedure, the patient's blood pressure, pulse, and                            oxygen saturations were monitored continuously. The                            Colonoscope was introduced through the anus and                            advanced to the terminal ileum. Scope  In: 4:00:39 PM Scope Out: 4:18:38 PM Scope Withdrawal Time: 0 hours 13 minutes 16 seconds  Total Procedure Duration: 0 hours 17 minutes 59 seconds  Findings:                 Hemorrhoids were found on perianal exam.                           Two sessile polyps were found in the proximal                            transverse colon. The polyps were 1 to 4 mm in                            size. These polyps were removed with a cold snare.                            Resection and retrieval were complete.                           Scattered areas of mildly erythematous mucosa was                            found in the sigmoid colon and in the descending                            colon. This was biopsied with a cold forceps for                            histology to exclude mild colitis (query                            preparation artifact).                           External and internal hemorrhoids were found during                            retroflexion and during digital exam. The  hemorrhoids were large and predominantly external. Complications:            No immediate complications. Estimated Blood Loss:     Estimated blood loss was minimal. Impression:               - Two 1 to 4 mm polyps in the proximal transverse                            colon, removed with a cold snare. Resected and                            retrieved.                           - Erythematous mucosa in the sigmoid colon and in                            the descending colon. Biopsied.                           - External and internal hemorrhoids. Recommendation:           - Patient has a contact number available for                            emergencies. The signs and symptoms of potential                            delayed complications were discussed with the                            patient. Return to normal activities tomorrow.                            Written discharge  instructions were provided to the                            patient.                           - Resume previous diet.                           - Continue present medications.                           - Await pathology results.                           - Given mixed internal and external hemorrhoids                            with significant prolapse surgical consultation for                            hemorrhoidectomy is recommended as hemorrhoidal  banding alone is not expected to meaningfully                            improve symptoms.                           - Repeat colonoscopy is recommended. The                            colonoscopy date will be determined after pathology                            results from today's exam become available for                            review. Jerene Bears, MD 04/26/2020 4:26:16 PM This report has been signed electronically.

## 2020-04-26 NOTE — Progress Notes (Signed)
A/ox3, pleased with MAC, report to RN 

## 2020-04-26 NOTE — Progress Notes (Signed)
Called to room to assist during endoscopic procedure.  Patient ID and intended procedure confirmed with present staff. Received instructions for my participation in the procedure from the performing physician.  

## 2020-04-26 NOTE — Progress Notes (Signed)
Reviewed medical/surgical history with patient and noted changes

## 2020-04-26 NOTE — Patient Instructions (Signed)
Handouts given:  Polyps, Hemorrhoids Resume previous diet Continue current medications Await pathology results   YOU HAD AN ENDOSCOPIC PROCEDURE TODAY AT [facility]:   Refer to the procedure report that was given to you for any specific questions about what was found during the examination.  If the procedure report does not answer your questions, please call your gastroenterologist to clarify.  If you requested that your care partner not be given the details of your procedure findings, then the procedure report has been included in a sealed envelope for you to review at your convenience later.  YOU SHOULD EXPECT: Some feelings of bloating in the abdomen. Passage of more gas than usual.  Walking can help get rid of the air that was put into your GI tract during the procedure and reduce the bloating. If you had a lower endoscopy (such as a colonoscopy or flexible sigmoidoscopy) you may notice spotting of blood in your stool or on the toilet paper. If you underwent a bowel prep for your procedure, you may not have a normal bowel movement for a few days.  Please Note:  You might notice some irritation and congestion in your nose or some drainage.  This is from the oxygen used during your procedure.  There is no need for concern and it should clear up in a day or so.  SYMPTOMS TO REPORT IMMEDIATELY:   Following lower endoscopy (colonoscopy or flexible sigmoidoscopy):  Excessive amounts of blood in the stool  Significant tenderness or worsening of abdominal pains  Swelling of the abdomen that is new, acute  Fever of 100F or higher  For urgent or emergent issues, a gastroenterologist can be reached at any hour by calling [phone]. Do not use MyChart messaging for urgent concerns.   DIET:  We do recommend a small meal at first, but then you may proceed to your regular diet.  Drink plenty of fluids but you should avoid alcoholic beverages for 24 hours.  ACTIVITY:  You should  plan to take it easy for the rest of today and you should NOT DRIVE or use heavy machinery until tomorrow (because of the sedation medicines used during the test).    FOLLOW UP: Our staff will call the number listed on your records 48-72 hours following your procedure to check on you and address any questions or concerns that you may have regarding the information given to you following your procedure. If we do not reach you, we will leave a message.  We will attempt to reach you two times.  During this call, we will ask if you have developed any symptoms of COVID 19. If you develop any symptoms (ie: fever, flu-like symptoms, shortness of breath, cough etc.) before then, please call [phone].  If you test positive for Covid 19 in the 2 weeks post procedure, please call and report this information to [place].    If any biopsies were taken you will be contacted by phone or by letter within the next 1-3 weeks.  Please call us at [phone] if you have not heard about the biopsies in 3 weeks.   SIGNATURES/CONFIDENTIALITY: You and/or your care partner have signed paperwork which will be entered into your electronic medical record.  These signatures attest to the fact that that the information above on your After Visit Summary has been reviewed and is understood.  Full responsibility of the confidentiality of this discharge information lies with you and/or your care-partner.

## 2020-04-27 ENCOUNTER — Telehealth: Payer: Self-pay

## 2020-04-27 NOTE — Telephone Encounter (Signed)
Pt referred to [facility] for hemorrohoids.

## 2020-04-28 ENCOUNTER — Telehealth: Payer: Self-pay | Admitting: *Deleted

## 2020-04-28 NOTE — Telephone Encounter (Signed)
  Follow up Call-  Call back number 04/26/2020  Post procedure Call Back phone  # [phone]  Permission to leave phone message Yes  Some recent data might be hidden     Patient questions:  Do you have a fever, pain , or abdominal swelling? No. Pain Score  0 *  Have you tolerated food without any problems? Yes.    Have you been able to return to your normal activities? Yes.    Do you have any questions about your discharge instructions: Diet   No. Medications  No. Follow up visit  No.  Do you have questions or concerns about your Care? No.  Actions: * If pain score is 4 or above: 1. No action needed, pain <4.Have you developed a fever since your procedure? no  2.   Have you had an respiratory symptoms (SOB or cough) since your procedure? no  3.   Have you tested positive for COVID 19 since your procedure no  4.   Have you had any family members/close contacts diagnosed with the COVID 19 since your procedure?  no   If yes to any of these questions please route to Joylene John, RN and Joella Prince, RN

## 2020-05-27 LAB — HM MAMMOGRAPHY

## 2020-05-31 ENCOUNTER — Encounter: Payer: Self-pay | Admitting: *Deleted

## 2020-06-13 ENCOUNTER — Other Ambulatory Visit: Payer: Self-pay

## 2020-06-13 ENCOUNTER — Ambulatory Visit
Admission: RE | Admit: 2020-06-13 | Discharge: 2020-06-13 | Discharge status: Against Medical Advice | Payer: BC Managed Care – PPO | Source: Ambulatory Visit | Attending: Emergency Medicine | Admitting: Emergency Medicine

## 2020-07-12 ENCOUNTER — Ambulatory Visit: Payer: BC Managed Care – PPO | Admitting: Emergency Medicine

## 2020-07-12 ENCOUNTER — Ambulatory Visit: Payer: Self-pay | Admitting: General Surgery

## 2020-07-12 NOTE — H&P (Signed)
The patient is a 60 year old female who presents with hemorrhoids. 60 year old female who presents to the office with rectal bleeding and pain. She states she has a long-standing history of constipation and hemorrhoids. She reports flares of hemorrhoids approximately 3-4 times a week. She saw Dr. Hilarie Fredrickson and was placed on a fiber supplement. She reports decreased flares of her hemorrhoid pain to approximately one per week with occasional blood on the toilet paper. She is status post a colonoscopy, which did not show any other major sources of bleeding within her colon. She is on iron supplement for anemia.   Past Surgical History Janeann Forehand, CNA; 07/12/2020 3:04 PM) Colon Polyp Removal - Colonoscopy Oral Surgery Sleeve Gastrectomy  Diagnostic Studies History Janeann Forehand, CNA; 07/12/2020 3:04 PM) Colonoscopy within last year 1-5 years ago Mammogram within last year Pap Smear 1-5 years ago  Allergies Janeann Forehand, CNA; 07/12/2020 3:06 PM) Adhesive Tape 1"x5yd *MEDICAL DEVICES AND SUPPLIES* Rash. Allergies Reconciled  Medication History Janeann Forehand, CNA; 07/12/2020 3:08 PM) Atorvastatin Calcium (40MG Tablet, Oral) Active. dilTIAZem HCl Active. Enbrel SureClick (54UJ/WJ Soln Auto-inj, Subcutaneous) Active. Ketoconazole (2% Cream, External) Active. Lisinopril (40MG Tablet, Oral) Active. hydroCHLOROthiazide (12.5MG Tablet, Oral) Active. Sutab [phone] Tablet, Oral) Active. Ondansetron HCl (4MG Tablet, 1 (one) Tablet Oral every six hours, as needed for nausea, Taken starting 05/20/2015) Active. Lisinopril (20MG Tablet, Oral) Active. Atorvastatin Calcium (20MG Tablet, Oral) Active. Humira Pen (40MG/0.8ML Pen-inj Kit, Subcutaneous) Active. Hydrochlorothiazide (12.5MG Capsule, Oral) Active. Meloxicam (7.5MG Tablet, Oral) Active. Omeprazole (40MG Capsule DR, Oral) Active. Pantoprazole Sodium (40MG Tablet DR, Oral) Active. Krill Oil (450MG  Capsule, Oral) Active. Vitamin D3 (2000UNIT Tablet, Oral) Active. ZyrTEC Allergy (10MG Capsule, Oral) Active. Ferrex 150 (150MG Capsule, Oral two times daily) Active. Medications Reconciled  Social History Janeann Forehand, CNA; 07/12/2020 3:04 PM) Alcohol use Occasional alcohol use. No caffeine use No drug use Tobacco use Never smoker.  Family History Janeann Forehand, CNA; 07/12/2020 3:04 PM) Breast Cancer Family Members In General. Cerebrovascular Accident Father, Mother. Cervical Cancer Family Members In General. Depression Brother, Father, Mother, Sister. Diabetes Mellitus Brother, Mother, Sister. Heart Disease Father. Hypertension Brother, Father, Mother, Sister, Son. Prostate Cancer Father. Respiratory Condition Brother.  Pregnancy / Birth History Janeann Forehand, CNA; 07/12/2020 3:04 PM) Age at menarche 43 years. Age of menopause 77-60 Gravida 3 Irregular periods Maternal age 38-25 Para 56  Other Problems Janeann Forehand, CNA; 07/12/2020 3:04 PM) Arthritis Back Pain Depression Gastric Ulcer Gastroesophageal Reflux Disease Hemorrhoids Hypercholesterolemia Inguinal Hernia Sleep Apnea     Review of Systems Janeann Forehand CNA; 07/12/2020 3:04 PM) General Present- Fatigue. Not Present- Appetite Loss, Chills, Fever, Night Sweats, Weight Gain and Weight Loss. Skin Not Present- Change in Wart/Mole, Dryness, Hives, Jaundice, New Lesions, Non-Healing Wounds, Rash and Ulcer. HEENT Not Present- Earache, Hearing Loss, Hoarseness, Nose Bleed, Oral Ulcers, Ringing in the Ears, Seasonal Allergies, Sinus Pain, Sore Throat, Visual Disturbances, Wears glasses/contact lenses and Yellow Eyes. Respiratory Present- Snoring. Not Present- Bloody sputum, Chronic Cough, Difficulty Breathing and Wheezing. Breast Not Present- Breast Mass, Breast Pain, Nipple Discharge and Skin Changes. Cardiovascular Not Present- Chest Pain, Difficulty Breathing Lying Down,  Leg Cramps, Palpitations, Rapid Heart Rate, Shortness of Breath and Swelling of Extremities. Gastrointestinal Present- Abdominal Pain, Bloating, Bloody Stool, Constipation, Hemorrhoids and Rectal Pain. Not Present- Change in Bowel Habits, Chronic diarrhea, Difficulty Swallowing, Excessive gas, Gets full quickly at meals, Indigestion, Nausea and Vomiting. Female Genitourinary Not Present- Frequency, Nocturia, Painful Urination, Pelvic Pain and Urgency. Musculoskeletal Present- Back Pain.  Not Present- Joint Pain, Joint Stiffness, Muscle Pain, Muscle Weakness and Swelling of Extremities. Neurological Not Present- Decreased Memory, Fainting, Headaches, Numbness, Seizures, Tingling, Tremor, Trouble walking and Weakness. Psychiatric Not Present- Anxiety, Bipolar, Change in Sleep Pattern, Depression, Fearful and Frequent crying. Endocrine Not Present- Cold Intolerance, Excessive Hunger, Hair Changes, Heat Intolerance, Hot flashes and New Diabetes. Hematology Not Present- Blood Thinners, Easy Bruising, Excessive bleeding, Gland problems, HIV and Persistent Infections.  Vitals (Donyelle Alston CNA; 07/12/2020 3:09 PM) 07/12/2020 3:08 PM Weight: 263.38 lb Height: 65in Body Surface Area: 2.22 m Body Mass Index: 43.83 kg/m  Temp.: 98.40F  Pulse: 76 (Regular)  P.OX: 96% (Room air)        Physical Exam Leighton Ruff MD; 09/09/4008 3:27 PM)  General Mental Status-Alert. General Appearance-Cooperative.  Rectal Anorectal Exam External - skin tag. Internal - normal sphincter tone, Non-tender.   Results Leighton Ruff MD; 2/72/5366 3:28 PM) Procedures  Name Value Date ANOSCOPY, DIAGNOSTIC (44034) [ Hemorrhoids ] Procedure Other: Procedure: Anoscopy....Marland KitchenMarland KitchenSurgeon: Marcello Moores....Marland KitchenMarland KitchenAfter the risks and benefits were explained, verbal consent was obtained for above procedure. A medical assistant chaperone was present thoroughout the entire procedure. ....Marland KitchenMarland KitchenAnesthesia:  none....Marland KitchenMarland KitchenDiagnosis: Rectal bleeding, rectal pain....Marland KitchenMarland KitchenFindings: Grade 4 left lateral internal hemorrhoid with large bilobed external component. External right posterior and right anterior hemorrhoids with minimal internal component.  Performed: 07/12/2020 3:27 PM    Assessment & Plan Leighton Ruff MD; 7/42/5956 3:27 PM)  PROLAPSED INTERNAL HEMORRHOIDS, GRADE 4 (K64.3) Impression: Patient appears to have a bilobed left lateral hemorrhoid with grade 4 internal prolapse. There is minimal internal disease in the right-sided hemorrhoids. We discussed proceeding with standard 3 column hemorrhoidectomy versus single column and rhytidectomy of the left lateral hemorrhoid. We discussed the typical recovery time and postoperative pain of both surgeries. She elected to proceed with single column hemorrhoidectomy for now. We discussed the risk of bleeding, pain and recurrence in detail. I would like for her to continue her fiber supplement for the next few months until we get through surgery. She may need to continue this indefinitely.

## 2020-07-14 ENCOUNTER — Ambulatory Visit (INDEPENDENT_AMBULATORY_CARE_PROVIDER_SITE_OTHER): Payer: BC Managed Care – PPO | Admitting: Emergency Medicine

## 2020-07-14 ENCOUNTER — Other Ambulatory Visit: Payer: Self-pay

## 2020-07-14 ENCOUNTER — Encounter: Payer: Self-pay | Admitting: Emergency Medicine

## 2020-07-14 VITALS — BP 130/80 | HR 61 | Temp 98.1°F | Resp 16 | Ht 65.0 in | Wt 263.0 lb

## 2020-07-14 DIAGNOSIS — G4733 Obstructive sleep apnea (adult) (pediatric): Secondary | ICD-10-CM | POA: Diagnosis not present

## 2020-07-14 DIAGNOSIS — I10 Essential (primary) hypertension: Secondary | ICD-10-CM

## 2020-07-14 DIAGNOSIS — M059 Rheumatoid arthritis with rheumatoid factor, unspecified: Secondary | ICD-10-CM

## 2020-07-14 DIAGNOSIS — Z6841 Body Mass Index (BMI) 40.0 and over, adult: Secondary | ICD-10-CM

## 2020-07-14 DIAGNOSIS — K219 Gastro-esophageal reflux disease without esophagitis: Secondary | ICD-10-CM

## 2020-07-14 DIAGNOSIS — D500 Iron deficiency anemia secondary to blood loss (chronic): Secondary | ICD-10-CM

## 2020-07-14 DIAGNOSIS — E785 Hyperlipidemia, unspecified: Secondary | ICD-10-CM

## 2020-07-14 MED ORDER — HYDROCHLOROTHIAZIDE 12.5 MG PO TABS: 12.5000 mg | ORAL_TABLET | Freq: Every day | ORAL | 3 refills | Status: DC

## 2020-07-14 MED ORDER — POLYSACCHARIDE IRON COMPLEX 150 MG PO CAPS: ORAL_CAPSULE | ORAL | 3 refills | Status: DC

## 2020-07-14 MED ORDER — LISINOPRIL 40 MG PO TABS: 40.0000 mg | ORAL_TABLET | Freq: Every day | ORAL | 3 refills | Status: DC

## 2020-07-14 MED ORDER — ATORVASTATIN CALCIUM 40 MG PO TABS: 40.0000 mg | ORAL_TABLET | Freq: Every day | ORAL | 3 refills | Status: DC

## 2020-07-14 NOTE — Patient Instructions (Addendum)
   If you have lab work done today you will be contacted with your lab results within the next 2 weeks.  If you have not heard from us then please contact us. The fastest way to get your results is to register for My Chart.   IF you received an x-ray today, you will receive an invoice from [facility]. Please contact [facility] at [phone] with questions or concerns regarding your invoice.   IF you received labwork today, you will receive an invoice from [facility]. Please contact [facility] at [phone] with questions or concerns regarding your invoice.   Our billing staff will not be able to assist you with questions regarding bills from these companies.  You will be contacted with the lab results as soon as they are available. The fastest way to get your results is to activate your My Chart account. Instructions are located on the last page of this paperwork. If you have not heard from us regarding the results in 2 weeks, please contact this office.     Hypertension, Adult High blood pressure (hypertension) is when the force of blood pumping through the arteries is too strong. The arteries are the blood vessels that carry blood from the heart throughout the body. Hypertension forces the heart to work harder to pump blood and may cause arteries to become narrow or stiff. Untreated or uncontrolled hypertension can cause a heart attack, heart failure, a stroke, kidney disease, and other problems. A blood pressure reading consists of a higher number over a lower number. Ideally, your blood pressure should be below 120/80. The first ("top") number is called the systolic pressure. It is a measure of the pressure in your arteries as your heart beats. The second ("bottom") number is called the diastolic pressure. It is a measure of the pressure in your arteries as the heart relaxes. What are the causes? The exact cause of this condition is not known. There are some conditions  that result in or are related to high blood pressure. What increases the risk? Some risk factors for high blood pressure are under your control. The following factors may make you more likely to develop this condition:  Smoking.  Having type 2 diabetes mellitus, high cholesterol, or both.  Not getting enough exercise or physical activity.  Being overweight.  Having too much fat, sugar, calories, or salt (sodium) in your diet.  Drinking too much alcohol. Some risk factors for high blood pressure may be difficult or impossible to change. Some of these factors include:  Having chronic kidney disease.  Having a family history of high blood pressure.  Age. Risk increases with age.  Race. You may be at higher risk if you are African American.  Gender. Men are at higher risk than women before age 45. After age 65, women are at higher risk than men.  Having obstructive sleep apnea.  Stress. What are the signs or symptoms? High blood pressure may not cause symptoms. Very high blood pressure (hypertensive crisis) may cause:  Headache.  Anxiety.  Shortness of breath.  Nosebleed.  Nausea and vomiting.  Vision changes.  Severe chest pain.  Seizures. How is this diagnosed? This condition is diagnosed by measuring your blood pressure while you are seated, with your arm resting on a flat surface, your legs uncrossed, and your feet flat on the floor. The cuff of the blood pressure monitor will be placed directly against the skin of your upper arm at the level of your heart.   It should be measured at least twice using the same arm. Certain conditions can cause a difference in blood pressure between your right and left arms. Certain factors can cause blood pressure readings to be lower or higher than normal for a short period of time:  When your blood pressure is higher when you are in a health care provider's office than when you are at home, this is called white coat hypertension.  Most people with this condition do not need medicines.  When your blood pressure is higher at home than when you are in a health care provider's office, this is called masked hypertension. Most people with this condition may need medicines to control blood pressure. If you have a high blood pressure reading during one visit or you have normal blood pressure with other risk factors, you may be asked to:  Return on a different day to have your blood pressure checked again.  Monitor your blood pressure at home for 1 week or longer. If you are diagnosed with hypertension, you may have other blood or imaging tests to help your health care provider understand your overall risk for other conditions. How is this treated? This condition is treated by making healthy lifestyle changes, such as eating healthy foods, exercising more, and reducing your alcohol intake. Your health care provider may prescribe medicine if lifestyle changes are not enough to get your blood pressure under control, and if:  Your systolic blood pressure is above 130.  Your diastolic blood pressure is above 80. Your personal target blood pressure may vary depending on your medical conditions, your age, and other factors. Follow these instructions at home: Eating and drinking  Eat a diet that is high in fiber and potassium, and low in sodium, added sugar, and fat. An example eating plan is called the DASH (Dietary Approaches to Stop Hypertension) diet. To eat this way: ? Eat plenty of fresh fruits and vegetables. Try to fill one half of your plate at each meal with fruits and vegetables. ? Eat whole grains, such as whole-wheat pasta, [place] rice, or whole-grain bread. Fill about one fourth of your plate with whole grains. ? Eat or drink low-fat dairy products, such as skim milk or low-fat yogurt. ? Avoid fatty cuts of meat, processed or cured meats, and poultry with skin. Fill about one fourth of your plate with lean proteins, such  as fish, chicken without skin, beans, eggs, or tofu. ? Avoid pre-made and processed foods. These tend to be higher in sodium, added sugar, and fat.  Reduce your daily sodium intake. Most people with hypertension should eat less than 1,500 mg of sodium a day.  Do not drink alcohol if: ? Your health care provider tells you not to drink. ? You are pregnant, may be pregnant, or are planning to become pregnant.  If you drink alcohol: ? Limit how much you use to:  0-1 drink a day for women.  0-2 drinks a day for men. ? Be aware of how much alcohol is in your drink. In the [place] one drink equals one 12 oz bottle of beer (355 mL), one 5 oz glass of wine (148 mL), or one 1 oz glass of hard liquor (44 mL).   Lifestyle  Work with your health care provider to maintain a healthy body weight or to lose weight. Ask what an ideal weight is for you.  Get at least 30 minutes of exercise most days of the week. Activities may include walking, swimming, or   biking.  Include exercise to strengthen your muscles (resistance exercise), such as Pilates or lifting weights, as part of your weekly exercise routine. Try to do these types of exercises for 30 minutes at least 3 days a week.  Do not use any products that contain nicotine or tobacco, such as cigarettes, e-cigarettes, and chewing tobacco. If you need help quitting, ask your health care provider.  Monitor your blood pressure at home as told by your health care provider.  Keep all follow-up visits as told by your health care provider. This is important.   Medicines  Take over-the-counter and prescription medicines only as told by your health care provider. Follow directions carefully. Blood pressure medicines must be taken as prescribed.  Do not skip doses of blood pressure medicine. Doing this puts you at risk for problems and can make the medicine less effective.  Ask your health care provider about side effects or reactions to medicines that you  should watch for. Contact a health care provider if you:  Think you are having a reaction to a medicine you are taking.  Have headaches that keep coming back (recurring).  Feel dizzy.  Have swelling in your ankles.  Have trouble with your vision. Get help right away if you:  Develop a severe headache or confusion.  Have unusual weakness or numbness.  Feel faint.  Have severe pain in your chest or abdomen.  Vomit repeatedly.  Have trouble breathing. Summary  Hypertension is when the force of blood pumping through your arteries is too strong. If this condition is not controlled, it may put you at risk for serious complications.  Your personal target blood pressure may vary depending on your medical conditions, your age, and other factors. For most people, a normal blood pressure is less than 120/80.  Hypertension is treated with lifestyle changes, medicines, or a combination of both. Lifestyle changes include losing weight, eating a healthy, low-sodium diet, exercising more, and limiting alcohol. This information is not intended to replace advice given to you by your health care provider. Make sure you discuss any questions you have with your health care provider. Document Revised: 02/13/2018 Document Reviewed: 02/13/2018 [employer] Patient Education  2021 [employer]  

## 2020-07-14 NOTE — Progress Notes (Signed)
Laurie Hodge 60 y.o.   Chief Complaint  Patient presents with  . Hypertension    Follow up   . Medication Refill    Lisinopril, Atorvastatin, Hctz and Polysaccharide Iron    HISTORY OF PRESENT ILLNESS: This is a 60 y.o. female with history of hypertension and dyslipidemia here for follow-up. Needs medication refill of lisinopril, atorvastatin and hydrochlorothiazide. Also has history of iron deficiency anemia. History of rheumatoid arthritis.  Sees rheumatologist on a regular basis.  Recently switched to Enbrel which is working much better for her. Has no complaints or medical concerns today. Fully vaccinated against Covid with a booster.  HPI   Prior to Admission medications   Medication Sig Start Date End Date Taking? Authorizing Provider  Adalimumab (HUMIRA PEN) 40 MG/0.8ML PNKT Inject 40 mg into the skin every 14 (fourteen) days. 07/06/15  Yes Jeffery, Chelle, PA  atorvastatin (LIPITOR) 40 MG tablet Take 1 tablet (40 mg total) by mouth daily. 01/07/20  Yes Jeslin Bazinet, [name], MD  Biotin 5 MG CAPS Take by mouth.   Yes [provider]  calcium-vitamin D (OSCAL WITH D) 500-200 MG-UNIT tablet Take 1 tablet by mouth 3 (three) times daily.    Yes [provider]  cetirizine (ZYRTEC) 10 MG tablet Take 10 mg by mouth daily as needed for allergies.    Yes [provider]  cholecalciferol (VITAMIN D) 1000 UNITS tablet Take 2,000 Units by mouth daily.    Yes [provider]  Cyanocobalamin (VITAMIN B-12) 2500 MCG SUBL Place 2,500 mcg under the tongue daily.   Yes [provider]  diltiazem 2 % GEL Using your index finger, apply a small amount of medication inside the rectum up to your first knuckle/joint twice daily x 4 weeks. 04/23/20  Yes Pyrtle, Lajuan Lines, MD  Fish Oil-Krill Oil (KRILL OIL PLUS) CAPS Take 750 mg by mouth daily.   Yes [provider]  hydrochlorothiazide (HYDRODIURIL) 12.5 MG tablet Take 1 tablet (12.5 mg total) by  mouth daily. 01/07/20  Yes Tymeshia Awan, [name], MD  iron polysaccharides (FERREX 150) 150 MG capsule TAKE ONE CAPSULE BY MOUTH TWICE DAILY. 07/07/19  Yes Cristen Bredeson, [name], MD  lisinopril (ZESTRIL) 40 MG tablet Take 1 tablet (40 mg total) by mouth daily. 01/07/20  Yes Kaetlin Bullen, [name], MD  meloxicam (MOBIC) 7.5 MG tablet Take 1 tablet (7.5 mg total) by mouth as needed for pain. 09/25/17  Yes Harrison Mons, PA  omeprazole (PRILOSEC) 40 MG capsule TAKE 1 CAPSULE BY MOUTH  DAILY 01/18/20  Yes Kale Rondeau, [name], MD  Multiple Vitamin (MULTIVITAMIN WITH MINERALS) TABS tablet Take 1 tablet by mouth 2 (two) times daily. Patient not taking: Reported on 07/14/2020    [provider]  senna (SENOKOT) 8.6 MG TABS tablet Take 2 tablets by mouth as needed for mild constipation. Reported on 10/12/2015 Patient not taking: Reported on 07/14/2020    [provider]    Allergies  Allergen Reactions  . Adhesive [Tape] Rash    Skin Peels    Patient Active Problem List   Diagnosis Date Noted  . Tinea versicolor 01/07/2020  . Body mass index (BMI) of 40.1-44.9 in adult ([place]) 01/07/2020  . Left hand weakness 07/15/2019  . Osteoarthritis 02/05/2015  . Hyperlipidemia 11/06/2013  . Rheumatoid arthritis ([place]) 06/10/2013  . BMI 36.0-36.9,adult   . GERD (gastroesophageal reflux disease)   . OSA (obstructive sleep apnea)     Class: Minor  . HTN (hypertension)   . Vitamin  D insufficiency   . Iron (Fe) deficiency anemia     Past Medical History:  Diagnosis Date  . Allergy   . Anemia   . Arthritis    RA  . Depression   . External hemorrhoids   . GERD (gastroesophageal reflux disease)   . H. pylori infection    many years ago  . Hemorrhoid   . Hiatal hernia   . HSV-2 infection    IgG  . HTN (hypertension)   . Hyperlipidemia   . Hyperplastic colon polyp   . Iron (Fe) deficiency anemia   . Obesity, Class III, BMI 40-49.9 (morbid obesity) ([place])   . OSA on CPAP    cpap broke  this week  . Sleep apnea    do not use her cipap  . Ulcer    gastric - h. pylori positive  . Vitamin D insufficiency     Past Surgical History:  Procedure Laterality Date  . LAPAROSCOPIC GASTRIC SLEEVE RESECTION N/A 05/31/2015   Procedure: LAPAROSCOPIC GASTRIC SLEEVE RESECTION;  Surgeon: Arta Bruce Kinsinger, MD;  Location: [facility];  Service: General;  Laterality: N/A;  . TUBAL LIGATION      Social History   Socioeconomic History  . Marital status: Divorced    Spouse name: n/a  . Number of children: 3  . Years of education: 26  . Highest education level: Not on file  Occupational History  . 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 Smoking status: Never Smoker  . Smokeless tobacco: Never Used  Vaping Use  . Vaping Use: Never used  Substance and Sexual Activity  . Alcohol use: Yes    Alcohol/week: 0.0 standard drinks    Comment: 2-3 times a year at special events  . Drug use: No  . Sexual activity: Not Currently    Partners: Male    Comment: not active since divorce from husband 2010  Other Topics Concern  . Not on file  Social History Narrative   Lives with daughter. Her mother who has dementia and had a stroke lives with her during the week and her siblings take turns staying with their mother on the weekends.      Denies caffeine use    Social Determinants of Radio broadcast assistant Strain: Not on file  Food Insecurity: Not on file  Transportation Needs: Not on file  Physical Activity: Not on file  Stress: Not on file  Social Connections: Not on file  Intimate Partner Violence: Not on file    Family History  Problem Relation Age of Onset  . Diabetes Mother   . Hypertension Mother   . Dementia Mother   . Stroke Mother 55  . Hyperlipidemia Mother   . Cancer Paternal Aunt        breast ca x 5 maternal aunts  . Diabetes Sister   . Heart disease Sister   . Hyperlipidemia  Sister   . Hypertension Sister   . Heart disease Father   . Hyperlipidemia Father   . Hypertension Father   . Stroke Father   . Heart disease Sister   . Hyperlipidemia Sister   . Hypertension Sister   . Diabetes Brother   . Hyperlipidemia Brother   . Hypertension Brother   . Hypertension Son   . Colon cancer Neg Hx   . Esophageal cancer Neg Hx   . Rectal cancer Neg Hx   .  Stomach cancer Neg Hx      Review of Systems  Constitutional: Negative.  Negative for chills and fever.  HENT: Negative.  Negative for congestion and sore throat.   Respiratory: Negative.  Negative for cough and shortness of breath.   Cardiovascular: Negative.  Negative for chest pain and palpitations.  Gastrointestinal: Negative.  Negative for abdominal pain, blood in stool, diarrhea, nausea and vomiting.  Genitourinary: Negative.   Musculoskeletal: Negative.   Skin: Negative.  Negative for rash.  Neurological: Negative.  Negative for dizziness and headaches.  All other systems reviewed and are negative.   Today's Vitals   07/14/20 1500  BP: (!) 149/82  Pulse: 61  Resp: 16  Temp: 98.1 F (36.7 C)  TempSrc: Temporal  SpO2: 97%  Weight: 263 lb (119.3 kg)  Height: 5\' 5"  (1.651 m)   Body mass index is 43.77 kg/m. Wt Readings from Last 3 Encounters:  07/14/20 263 lb (119.3 kg)  04/26/20 256 lb (116.1 kg)  04/23/20 256 lb 2 oz (116.2 kg)    Physical Exam Vitals reviewed.  Constitutional:      Appearance: Normal appearance.  HENT:     Head: Normocephalic.  Eyes:     Extraocular Movements: Extraocular movements intact.     Pupils: Pupils are equal, round, and reactive to light.  Cardiovascular:     Rate and Rhythm: Normal rate and regular rhythm.     Pulses: Normal pulses.     Heart sounds: Normal heart sounds.  Pulmonary:     Effort: Pulmonary effort is normal.     Breath sounds: Normal breath sounds.  Musculoskeletal:        General: Normal range of motion.     Cervical back: Normal  range of motion and neck supple.  Skin:    General: Skin is warm and dry.     Capillary Refill: Capillary refill takes less than 2 seconds.  Neurological:     General: No focal deficit present.     Mental Status: She is alert and oriented to person, place, and time.  Psychiatric:        Mood and Affect: Mood normal.        Behavior: Behavior normal.      ASSESSMENT & PLAN: Clinically stable.  No medical concerns identified during this visit. Continue present medications.  No changes. Follow-up in 6 months. HTN (hypertension) Well-controlled hypertension with normal blood pressure readings at home.  Continue present medications.  No changes.  Laurie Hodge was seen today for hypertension and medication refill.  Diagnoses and all orders for this visit:  Essential hypertension -     hydrochlorothiazide (HYDRODIURIL) 12.5 MG tablet; Take 1 tablet (12.5 mg total) by mouth daily. -     lisinopril (ZESTRIL) 40 MG tablet; Take 1 tablet (40 mg total) by mouth daily.  Rheumatoid arthritis with positive rheumatoid factor, involving unspecified site (HCC)  Body mass index (BMI) of 40.1-44.9 in adult (HCC)  OSA (obstructive sleep apnea)  Gastroesophageal reflux disease without esophagitis  Dyslipidemia  Hyperlipidemia, unspecified hyperlipidemia type -     atorvastatin (LIPITOR) 40 MG tablet; Take 1 tablet (40 mg total) by mouth daily.  Iron deficiency anemia due to chronic blood loss -     iron polysaccharides (FERREX 150) 150 MG capsule; TAKE ONE CAPSULE BY MOUTH TWICE DAILY.  Primary hypertension    Patient Instructions       If you have lab work done today you will be contacted with your lab results within  the next 2 weeks.  If you have not heard from [place] then please contact us. The fastest way to get your results is to register for My Chart.   IF you received an x-ray today, you will receive an invoice from [facility]. Please contact [facility] at  [phone] with questions or concerns regarding your invoice.   IF you received labwork today, you will receive an invoice from [facility]. Please contact [facility] at [phone] with questions or concerns regarding your invoice.   Our billing staff will not be able to assist you with questions regarding bills from these companies.  You will be contacted with the lab results as soon as they are available. The fastest way to get your results is to activate your My Chart account. Instructions are located on the last page of this paperwork. If you have not heard from [place] regarding the results in 2 weeks, please contact this office.     Hypertension, Adult High blood pressure (hypertension) is when the force of blood pumping through the arteries is too strong. The arteries are the blood vessels that carry blood from the heart throughout the body. Hypertension forces the heart to work harder to pump blood and may cause arteries to become narrow or stiff. Untreated or uncontrolled hypertension can cause a heart attack, heart failure, a stroke, kidney disease, and other problems. A blood pressure reading consists of a higher number over a lower number. Ideally, your blood pressure should be below 120/80. The first ("top") number is called the systolic pressure. It is a measure of the pressure in your arteries as your heart beats. The second ("bottom") number is called the diastolic pressure. It is a measure of the pressure in your arteries as the heart relaxes. What are the causes? The exact cause of this condition is not known. There are some conditions that result in or are related to high blood pressure. What increases the risk? Some risk factors for high blood pressure are under your control. The following factors may make you more likely to develop this condition:  Smoking.  Having type 2 diabetes mellitus, high cholesterol, or both.  Not getting enough exercise or physical activity.  Being  overweight.  Having too much fat, sugar, calories, or salt (sodium) in your diet.  Drinking too much alcohol. Some risk factors for high blood pressure may be difficult or impossible to change. Some of these factors include:  Having chronic kidney disease.  Having a family history of high blood pressure.  Age. Risk increases with age.  Race. You may be at higher risk if you are African American.  Gender. Men are at higher risk than women before age 43. After age 64, women are at higher risk than men.  Having obstructive sleep apnea.  Stress. What are the signs or symptoms? High blood pressure may not cause symptoms. Very high blood pressure (hypertensive crisis) may cause:  Headache.  Anxiety.  Shortness of breath.  Nosebleed.  Nausea and vomiting.  Vision changes.  Severe chest pain.  Seizures. How is this diagnosed? This condition is diagnosed by measuring your blood pressure while you are seated, with your arm resting on a flat surface, your legs uncrossed, and your feet flat on the floor. The cuff of the blood pressure monitor will be placed directly against the skin of your upper arm at the level of your heart. It should be measured at least twice using the same arm. Certain conditions can cause a difference in  blood pressure between your right and left arms. Certain factors can cause blood pressure readings to be lower or higher than normal for a short period of time:  When your blood pressure is higher when you are in a health care provider's office than when you are at home, this is called white coat hypertension. Most people with this condition do not need medicines.  When your blood pressure is higher at home than when you are in a health care provider's office, this is called masked hypertension. Most people with this condition may need medicines to control blood pressure. If you have a high blood pressure reading during one visit or you have normal blood  pressure with other risk factors, you may be asked to:  Return on a different day to have your blood pressure checked again.  Monitor your blood pressure at home for 1 week or longer. If you are diagnosed with hypertension, you may have other blood or imaging tests to help your health care provider understand your overall risk for other conditions. How is this treated? This condition is treated by making healthy lifestyle changes, such as eating healthy foods, exercising more, and reducing your alcohol intake. Your health care provider may prescribe medicine if lifestyle changes are not enough to get your blood pressure under control, and if:  Your systolic blood pressure is above 130.  Your diastolic blood pressure is above 80. Your personal target blood pressure may vary depending on your medical conditions, your age, and other factors. Follow these instructions at home: Eating and drinking  Eat a diet that is high in fiber and potassium, and low in sodium, added sugar, and fat. An example eating plan is called the DASH (Dietary Approaches to Stop Hypertension) diet. To eat this way: ? Eat plenty of fresh fruits and vegetables. Try to fill one half of your plate at each meal with fruits and vegetables. ? Eat whole grains, such as whole-wheat pasta, Ragon rice, or whole-grain bread. Fill about one fourth of your plate with whole grains. ? Eat or drink low-fat dairy products, such as skim milk or low-fat yogurt. ? Avoid fatty cuts of meat, processed or cured meats, and poultry with skin. Fill about one fourth of your plate with lean proteins, such as fish, chicken without skin, beans, eggs, or tofu. ? Avoid pre-made and processed foods. These tend to be higher in sodium, added sugar, and fat.  Reduce your daily sodium intake. Most people with hypertension should eat less than 1,500 mg of sodium a day.  Do not drink alcohol if: ? Your health care provider tells you not to drink. ? You are  pregnant, may be pregnant, or are planning to become pregnant.  If you drink alcohol: ? Limit how much you use to:  0-1 drink a day for women.  0-2 drinks a day for men. ? Be aware of how much alcohol is in your drink. In the [place] one drink equals one 12 oz bottle of beer (355 mL), one 5 oz glass of wine (148 mL), or one 1 oz glass of hard liquor (44 mL).   Lifestyle  Work with your health care provider to maintain a healthy body weight or to lose weight. Ask what an ideal weight is for you.  Get at least 30 minutes of exercise most days of the week. Activities may include walking, swimming, or biking.  Include exercise to strengthen your muscles (resistance exercise), such as Pilates or lifting weights, as part  of your weekly exercise routine. Try to do these types of exercises for 30 minutes at least 3 days a week.  Do not use any products that contain nicotine or tobacco, such as cigarettes, e-cigarettes, and chewing tobacco. If you need help quitting, ask your health care provider.  Monitor your blood pressure at home as told by your health care provider.  Keep all follow-up visits as told by your health care provider. This is important.   Medicines  Take over-the-counter and prescription medicines only as told by your health care provider. Follow directions carefully. Blood pressure medicines must be taken as prescribed.  Do not skip doses of blood pressure medicine. Doing this puts you at risk for problems and can make the medicine less effective.  Ask your health care provider about side effects or reactions to medicines that you should watch for. Contact a health care provider if you:  Think you are having a reaction to a medicine you are taking.  Have headaches that keep coming back (recurring).  Feel dizzy.  Have swelling in your ankles.  Have trouble with your vision. Get help right away if you:  Develop a severe headache or confusion.  Have unusual weakness  or numbness.  Feel faint.  Have severe pain in your chest or abdomen.  Vomit repeatedly.  Have trouble breathing. Summary  Hypertension is when the force of blood pumping through your arteries is too strong. If this condition is not controlled, it may put you at risk for serious complications.  Your personal target blood pressure may vary depending on your medical conditions, your age, and other factors. For most people, a normal blood pressure is less than 120/80.  Hypertension is treated with lifestyle changes, medicines, or a combination of both. Lifestyle changes include losing weight, eating a healthy, low-sodium diet, exercising more, and limiting alcohol. This information is not intended to replace advice given to you by your health care provider. Make sure you discuss any questions you have with your health care provider. Document Revised: 02/13/2018 Document Reviewed: 02/13/2018 Elsevier Patient Education  2021 Elsevier Inc.      Agustina Caroli, MD [facility]

## 2020-07-14 NOTE — Assessment & Plan Note (Signed)
Well-controlled hypertension with normal blood pressure readings at home.  Continue present medications.  No changes.

## 2020-12-29 ENCOUNTER — Encounter: Payer: Self-pay | Admitting: Emergency Medicine

## 2020-12-29 ENCOUNTER — Ambulatory Visit (INDEPENDENT_AMBULATORY_CARE_PROVIDER_SITE_OTHER): Payer: BC Managed Care – PPO | Admitting: Emergency Medicine

## 2020-12-29 ENCOUNTER — Other Ambulatory Visit: Payer: Self-pay

## 2020-12-29 VITALS — BP 128/88 | HR 62 | Temp 97.9°F | Ht 65.0 in | Wt 255.0 lb

## 2020-12-29 DIAGNOSIS — Z1329 Encounter for screening for other suspected endocrine disorder: Secondary | ICD-10-CM | POA: Diagnosis not present

## 2020-12-29 DIAGNOSIS — I10 Essential (primary) hypertension: Secondary | ICD-10-CM | POA: Diagnosis not present

## 2020-12-29 DIAGNOSIS — G4733 Obstructive sleep apnea (adult) (pediatric): Secondary | ICD-10-CM | POA: Diagnosis not present

## 2020-12-29 DIAGNOSIS — Z0000 Encounter for general adult medical examination without abnormal findings: Secondary | ICD-10-CM

## 2020-12-29 DIAGNOSIS — Z13228 Encounter for screening for other metabolic disorders: Secondary | ICD-10-CM | POA: Diagnosis not present

## 2020-12-29 DIAGNOSIS — E785 Hyperlipidemia, unspecified: Secondary | ICD-10-CM | POA: Diagnosis not present

## 2020-12-29 DIAGNOSIS — Z130 Encounter for screening for diseases of the blood and blood-forming organs and certain disorders involving the immune mechanism: Secondary | ICD-10-CM | POA: Diagnosis not present

## 2020-12-29 DIAGNOSIS — M059 Rheumatoid arthritis with rheumatoid factor, unspecified: Secondary | ICD-10-CM

## 2020-12-29 DIAGNOSIS — K219 Gastro-esophageal reflux disease without esophagitis: Secondary | ICD-10-CM

## 2020-12-29 DIAGNOSIS — Z13220 Encounter for screening for lipoid disorders: Secondary | ICD-10-CM

## 2020-12-29 LAB — CBC WITH DIFFERENTIAL/PLATELET
Basophils Absolute: 0 10*3/uL (ref 0.0–0.1)
Basophils Relative: 0.7 % (ref 0.0–3.0)
Eosinophils Absolute: 0.1 10*3/uL (ref 0.0–0.7)
Eosinophils Relative: 2.4 % (ref 0.0–5.0)
HCT: 39.5 % (ref 36.0–46.0)
Hemoglobin: 13.2 g/dL (ref 12.0–15.0)
Lymphocytes Relative: 38.6 % (ref 12.0–46.0)
Lymphs Abs: 2.1 10*3/uL (ref 0.7–4.0)
MCHC: 33.4 g/dL (ref 30.0–36.0)
MCV: 85 fl (ref 78.0–100.0)
Monocytes Absolute: 0.6 10*3/uL (ref 0.1–1.0)
Monocytes Relative: 11 % (ref 3.0–12.0)
Neutro Abs: 2.6 10*3/uL (ref 1.4–7.7)
Neutrophils Relative %: 47.3 % (ref 43.0–77.0)
Platelets: 304 10*3/uL (ref 150.0–400.0)
RBC: 4.64 Mil/uL (ref 3.87–5.11)
RDW: 13.9 % (ref 11.5–15.5)
WBC: 5.4 10*3/uL (ref 4.0–10.5)

## 2020-12-29 LAB — LIPID PANEL
Cholesterol: 176 mg/dL (ref 0–200)
HDL: 54.3 mg/dL (ref >39.00)
LDL Cholesterol: 105 mg/dL — ABNORMAL HIGH (ref 0–99)
NonHDL: 121.55
Total CHOL/HDL Ratio: 3
Triglycerides: 81 mg/dL (ref 0.0–149.0)
VLDL: 16.2 mg/dL (ref 0.0–40.0)

## 2020-12-29 LAB — COMPREHENSIVE METABOLIC PANEL
ALT: 16 U/L (ref 0–35)
AST: 16 U/L (ref 0–37)
Albumin: 4.1 g/dL (ref 3.5–5.2)
Alkaline Phosphatase: 73 U/L (ref 39–117)
BUN: 11 mg/dL (ref 6–23)
CO2: 29 mEq/L (ref 19–32)
Calcium: 9.5 mg/dL (ref 8.4–10.5)
Chloride: 104 mEq/L (ref 96–112)
Creatinine, Ser: 0.7 mg/dL (ref 0.40–1.20)
GFR: 95.62 mL/min (ref >60.00)
Glucose, Bld: 103 mg/dL — ABNORMAL HIGH (ref 70–99)
Potassium: 3.7 mEq/L (ref 3.5–5.1)
Sodium: 140 mEq/L (ref 135–145)
Total Bilirubin: 0.8 mg/dL (ref 0.2–1.2)
Total Protein: 7.8 g/dL (ref 6.0–8.3)

## 2020-12-29 LAB — HEMOGLOBIN A1C: Hgb A1c MFr Bld: 6 % (ref 4.6–6.5)

## 2020-12-29 LAB — TSH: TSH: 3.04 u[IU]/mL (ref 0.35–5.50)

## 2020-12-29 NOTE — Progress Notes (Signed)
Laurie Hodge 60 y.o.   Chief Complaint  Patient presents with   Annual Exam    HISTORY OF PRESENT ILLNESS: This is a 60 y.o. female here for annual exam. Has the following chronic medical problems: 1.  Rheumatoid arthritis, presently on weekly Enbrel 2.  Hypertension on hydrochlorothiazide 12.5 mg daily 3.  Dyslipidemia on atorvastatin 40 mg daily. Doing well.  Has no complaints or medical concerns.  HPI   Prior to Admission medications   Medication Sig Start Date End Date Taking? Authorizing Provider  atorvastatin (LIPITOR) 40 MG tablet Take 1 tablet (40 mg total) by mouth daily. 07/14/20  Yes Carolyn Maniscalco, [name], MD  Biotin 5 MG CAPS Take by mouth.   Yes [provider]  calcium-vitamin D (OSCAL WITH D) 500-200 MG-UNIT tablet Take 1 tablet by mouth 3 (three) times daily.    Yes [provider]  cetirizine (ZYRTEC) 10 MG tablet Take 10 mg by mouth daily as needed for allergies.    Yes [provider]  cholecalciferol (VITAMIN D) 1000 UNITS tablet Take 2,000 Units by mouth daily.    Yes [provider]  Cyanocobalamin (VITAMIN B-12) 2500 MCG SUBL Place 2,500 mcg under the tongue daily.   Yes [provider]  diltiazem 2 % GEL Using your index finger, apply a small amount of medication inside the rectum up to your first knuckle/joint twice daily x 4 weeks. 04/23/20  Yes Pyrtle, Lajuan Lines, MD  Fish Oil-Krill Oil (KRILL OIL PLUS) CAPS Take 750 mg by mouth daily.   Yes [provider]  hydrochlorothiazide (HYDRODIURIL) 12.5 MG tablet Take 1 tablet (12.5 mg total) by mouth daily. 07/14/20  Yes Evelia Waskey, [name], MD  iron polysaccharides (FERREX 150) 150 MG capsule TAKE ONE CAPSULE BY MOUTH TWICE DAILY. 07/14/20  Yes Alesha Jaffee, [name], MD  lisinopril (ZESTRIL) 40 MG tablet Take 1 tablet (40 mg total) by mouth daily. 07/14/20  Yes Mari Battaglia, [name], MD  meloxicam (MOBIC) 7.5 MG tablet Take 1 tablet (7.5 mg total) by mouth as  needed for pain. 09/25/17  Yes Harrison Mons, PA  Multiple Vitamin (MULTIVITAMIN WITH MINERALS) TABS tablet Take 1 tablet by mouth 2 (two) times daily.   Yes [provider]  omeprazole (PRILOSEC) 40 MG capsule TAKE 1 CAPSULE BY MOUTH  DAILY 01/18/20  Yes Izola Teague, [name], MD  senna (SENOKOT) 8.6 MG TABS tablet Take 2 tablets by mouth as needed for mild constipation. Reported on 10/12/2015   Yes [provider]    Allergies  Allergen Reactions   Adhesive [Tape] Rash    Skin Peels    Patient Active Problem List   Diagnosis Date Noted   Tinea versicolor 01/07/2020   Body mass index (BMI) of 40.1-44.9 in adult [facility]) 01/07/2020   Left hand weakness 07/15/2019   Osteoarthritis 02/05/2015   Hyperlipidemia 11/06/2013   Rheumatoid arthritis ([place]) 06/10/2013   BMI 36.0-36.9,adult    GERD (gastroesophageal reflux disease)    OSA (obstructive sleep apnea)     Class: Minor   HTN (hypertension)    Vitamin D insufficiency    Iron (Fe) deficiency anemia     Past Medical History:  Diagnosis Date   Allergy    Anemia    Arthritis    RA   Depression    External hemorrhoids    GERD (gastroesophageal reflux disease)    H. pylori infection    many years ago   Hemorrhoid    Hiatal hernia  HSV-2 infection    IgG   HTN (hypertension)    Hyperlipidemia    Hyperplastic colon polyp    Iron (Fe) deficiency anemia    Obesity, Class III, BMI 40-49.9 (morbid obesity) (HCC)    OSA on CPAP    cpap broke this week   Sleep apnea    do not use her cipap   Ulcer    gastric - h. pylori positive   Vitamin D insufficiency     Past Surgical History:  Procedure Laterality Date   LAPAROSCOPIC GASTRIC SLEEVE RESECTION N/A 05/31/2015   Procedure: LAPAROSCOPIC GASTRIC SLEEVE RESECTION;  Surgeon: Arta Bruce Kinsinger, MD;  Location: [facility];  Service: General;  Laterality: N/A;   TUBAL LIGATION      Social History   Socioeconomic History   Marital status: Divorced    Spouse  name: n/a   Number of children: 3   Years of education: 12   Highest education level: Not on file  Occupational History   Occupation: TEACHER'S ASSISTANT    Employer: [employer]    Employer: [employer]  Tobacco Use   Smoking status: Never   Smokeless tobacco: Never  Vaping Use   Vaping Use: Never used  Substance and Sexual Activity   Alcohol use: Yes    Alcohol/week: 0.0 standard drinks    Comment: 2-3 times a year at special events   Drug use: No   Sexual activity: Not Currently    Partners: Male    Comment: not active since divorce from husband 2010  Other Topics Concern   Not on file  Social History Narrative   Lives with daughter. Her mother who has dementia and had a stroke lives with her during the week and her siblings take turns staying with their mother on the weekends.      Denies caffeine use    Social Determinants of Radio broadcast assistant Strain: Not on file  Food Insecurity: Not on file  Transportation Needs: Not on file  Physical Activity: Not on file  Stress: Not on file  Social Connections: Not on file  Intimate Partner Violence: Not on file    Family History  Problem Relation Age of Onset   Diabetes Mother    Hypertension Mother    Dementia Mother    Stroke Mother 71   Hyperlipidemia Mother    Cancer Paternal Aunt        breast ca x 5 maternal aunts   Diabetes Sister    Heart disease Sister    Hyperlipidemia Sister    Hypertension Sister    Heart disease Father    Hyperlipidemia Father    Hypertension Father    Stroke Father    Heart disease Sister    Hyperlipidemia Sister    Hypertension Sister    Diabetes Brother    Hyperlipidemia Brother    Hypertension Brother    Hypertension Son    Colon cancer Neg Hx    Esophageal cancer Neg Hx    Rectal cancer Neg Hx    Stomach cancer Neg Hx      Review of Systems  Constitutional: Negative.  Negative for chills and fever.  HENT: Negative.  Negative for  congestion and sore throat.   Respiratory: Negative.  Negative for cough and shortness of breath.   Cardiovascular: Negative.  Negative for chest pain and palpitations.  Gastrointestinal:  Negative for abdominal pain, blood in stool, diarrhea, melena, nausea and vomiting.  Genitourinary: Negative.  Negative for dysuria and hematuria.  Skin: Negative.  Negative for rash.  Neurological:  Negative for dizziness and headaches.  All other systems reviewed and are negative.  Today's Vitals   12/29/20 0846  BP: 128/88  Pulse: 62  Temp: 97.9 F (36.6 C)  TempSrc: Oral  SpO2: 96%  Weight: 255 lb (115.7 kg)  Height: 5\' 5"  (1.651 m)   Body mass index is 42.43 kg/m.  Physical Exam Vitals reviewed.  Constitutional:      Appearance: Normal appearance.  HENT:     Head: Normocephalic.     Right Ear: Tympanic membrane, ear canal and external ear normal.     Left Ear: Tympanic membrane, ear canal and external ear normal.  Eyes:     Extraocular Movements: Extraocular movements intact.     Conjunctiva/sclera: Conjunctivae normal.     Pupils: Pupils are equal, round, and reactive to light.  Cardiovascular:     Rate and Rhythm: Normal rate and regular rhythm.     Pulses: Normal pulses.     Heart sounds: Normal heart sounds.  Pulmonary:     Effort: Pulmonary effort is normal.     Breath sounds: Normal breath sounds.  Abdominal:     General: Bowel sounds are normal. There is no distension.     Palpations: Abdomen is soft.     Tenderness: There is no abdominal tenderness.  Musculoskeletal:        General: Normal range of motion.     Cervical back: Normal range of motion and neck supple.  Skin:    General: Skin is warm and dry.     Capillary Refill: Capillary refill takes less than 2 seconds.  Neurological:     General: No focal deficit present.     Mental Status: She is alert and oriented to person, place, and time.  Psychiatric:        Mood and Affect: Mood normal.        Behavior:  Behavior normal.     ASSESSMENT & PLAN: Tanis was seen today for annual exam.  Diagnoses and all orders for this visit:  Routine general medical examination at a health care facility  Essential hypertension -     Comprehensive metabolic panel  Dyslipidemia -     Lipid panel  OSA (obstructive sleep apnea)  Gastroesophageal reflux disease without esophagitis  Rheumatoid arthritis with positive rheumatoid factor, involving unspecified site (HCC)  Screening for deficiency anemia -     CBC with Differential  Screening for lipoid disorders  Screening for endocrine, metabolic and immunity disorder -     Hemoglobin A1c -     TSH  Modifiable risk factors discussed with patient including hypertension and dyslipidemia Anticipatory guidance according to age provided. The following topics were also discussed: Social Determinants of Health Smoking otherwise not necessary Diet and nutrition and need to decrease amount of daily carbohydrate intake Benefits of exercise and staying motion which is beneficial for rheumatoid arthritis Cancer screening and review of colonoscopy and mammogram recent results Vaccinations recommendations Cardiovascular risk assessment Mental health including depression and anxiety Fall and accident prevention  Patient Instructions  Health Maintenance, Female Adopting a healthy lifestyle and getting preventive care are important in promoting health and wellness. Ask your health care provider about: The right schedule for you to have regular tests and exams. Things you can do on your own to prevent diseases and keep yourself healthy. What should I know about diet, weight, and exercise? Eat a healthy diet  Eat a diet that includes plenty of vegetables, fruits, low-fat dairy products, and lean protein. Do not eat a lot of foods that are high in solid fats, added sugars, or sodium.  Maintain a healthy weight Body mass index (BMI) is used to identify  weight problems. It estimates body fat based on height and weight. Your health care provider can help determineyour BMI and help you achieve or maintain a healthy weight. Get regular exercise Get regular exercise. This is one of the most important things you can do for your health. Most adults should: Exercise for at least 150 minutes each week. The exercise should increase your heart rate and make you sweat (moderate-intensity exercise). Do strengthening exercises at least twice a week. This is in addition to the moderate-intensity exercise. Spend less time sitting. Even light physical activity can be beneficial. Watch cholesterol and blood lipids Have your blood tested for lipids and cholesterol at 60 years of age, then havethis test every 5 years. Have your cholesterol levels checked more often if: Your lipid or cholesterol levels are high. You are older than 58 years of age. You are at high risk for heart disease. What should I know about cancer screening? Depending on your health history and family history, you may need to have cancer screening at various ages. This may include screening for: Breast cancer. Cervical cancer. Colorectal cancer. Skin cancer. Lung cancer. What should I know about heart disease, diabetes, and high blood pressure? Blood pressure and heart disease High blood pressure causes heart disease and increases the risk of stroke. This is more likely to develop in people who have high blood pressure readings, are of African descent, or are overweight. Have your blood pressure checked: Every 3-5 years if you are 36-57 years of age. Every year if you are 62 years old or older. Diabetes Have regular diabetes screenings. This checks your fasting blood sugar level. Have the screening done: Once every three years after age 64 if you are at a normal weight and have a low risk for diabetes. More often and at a younger age if you are overweight or have a high risk for  diabetes. What should I know about preventing infection? Hepatitis B If you have a higher risk for hepatitis B, you should be screened for this virus. Talk with your health care provider to find out if you are at risk forhepatitis B infection. Hepatitis C Testing is recommended for: Everyone born from [birth year] through 1965. Anyone with known risk factors for hepatitis C. Sexually transmitted infections (STIs) Get screened for STIs, including gonorrhea and chlamydia, if: You are sexually active and are younger than 58 years of age. You are older than 58 years of age and your health care provider tells you that you are at risk for this type of infection. Your sexual activity has changed since you were last screened, and you are at increased risk for chlamydia or gonorrhea. Ask your health care provider if you are at risk. Ask your health care provider about whether you are at high risk for HIV. Your health care provider may recommend a prescription medicine to help prevent HIV infection. If you choose to take medicine to prevent HIV, you should first get tested for HIV. You should then be tested every 3 months for as long as you are taking the medicine. Pregnancy If you are about to stop having your period (premenopausal) and you may become pregnant, seek counseling before you get pregnant. Take 400 to 800  micrograms (mcg) of folic acid every day if you become pregnant. Ask for birth control (contraception) if you want to prevent pregnancy. Osteoporosis and menopause Osteoporosis is a disease in which the bones lose minerals and strength with aging. This can result in bone fractures. If you are 71 years old or older, or if you are at risk for osteoporosis and fractures, ask your health care provider if you should: Be screened for bone loss. Take a calcium or vitamin D supplement to lower your risk of fractures. Be given hormone replacement therapy (HRT) to treat symptoms of menopause. Follow these  instructions at home: Lifestyle Do not use any products that contain nicotine or tobacco, such as cigarettes, e-cigarettes, and chewing tobacco. If you need help quitting, ask your health care provider. Do not use street drugs. Do not share needles. Ask your health care provider for help if you need support or information about quitting drugs. Alcohol use Do not drink alcohol if: Your health care provider tells you not to drink. You are pregnant, may be pregnant, or are planning to become pregnant. If you drink alcohol: Limit how much you use to 0-1 drink a day. Limit intake if you are breastfeeding. Be aware of how much alcohol is in your drink. In the [place] one drink equals one 12 oz bottle of beer (355 mL), one 5 oz glass of wine (148 mL), or one 1 oz glass of hard liquor (44 mL). General instructions Schedule regular health, dental, and eye exams. Stay current with your vaccines. Tell your health care provider if: You often feel depressed. You have ever been abused or do not feel safe at home. Summary Adopting a healthy lifestyle and getting preventive care are important in promoting health and wellness. Follow your health care provider's instructions about healthy diet, exercising, and getting tested or screened for diseases. Follow your health care provider's instructions on monitoring your cholesterol and blood pressure. This information is not intended to replace advice given to you by your health care provider. Make sure you discuss any questions you have with your healthcare provider. Document Revised: 05/29/2018 Document Reviewed: 05/29/2018 Elsevier Patient Education  2022 [place], MD [facility]

## 2020-12-29 NOTE — Patient Instructions (Signed)
Health Maintenance, Female Adopting a healthy lifestyle and getting preventive care are important in promoting health and wellness. Ask your health care provider about: The right schedule for you to have regular tests and exams. Things you can do on your own to prevent diseases and keep yourself healthy. What should I know about diet, weight, and exercise? Eat a healthy diet  Eat a diet that includes plenty of vegetables, fruits, low-fat dairy products, and lean protein. Do not eat a lot of foods that are high in solid fats, added sugars, or sodium.  Maintain a healthy weight Body mass index (BMI) is used to identify weight problems. It estimates body fat based on height and weight. Your health care provider can help determineyour BMI and help you achieve or maintain a healthy weight. Get regular exercise Get regular exercise. This is one of the most important things you can do for your health. Most adults should: Exercise for at least 150 minutes each week. The exercise should increase your heart rate and make you sweat (moderate-intensity exercise). Do strengthening exercises at least twice a week. This is in addition to the moderate-intensity exercise. Spend less time sitting. Even light physical activity can be beneficial. Watch cholesterol and blood lipids Have your blood tested for lipids and cholesterol at 60 years of age, then havethis test every 5 years. Have your cholesterol levels checked more often if: Your lipid or cholesterol levels are high. You are older than 58 years of age. You are at high risk for heart disease. What should I know about cancer screening? Depending on your health history and family history, you may need to have cancer screening at various ages. This may include screening for: Breast cancer. Cervical cancer. Colorectal cancer. Skin cancer. Lung cancer. What should I know about heart disease, diabetes, and high blood pressure? Blood pressure and heart  disease High blood pressure causes heart disease and increases the risk of stroke. This is more likely to develop in people who have high blood pressure readings, are of African descent, or are overweight. Have your blood pressure checked: Every 3-5 years if you are 18-39 years of age. Every year if you are 40 years old or older. Diabetes Have regular diabetes screenings. This checks your fasting blood sugar level. Have the screening done: Once every three years after age 40 if you are at a normal weight and have a low risk for diabetes. More often and at a younger age if you are overweight or have a high risk for diabetes. What should I know about preventing infection? Hepatitis B If you have a higher risk for hepatitis B, you should be screened for this virus. Talk with your health care provider to find out if you are at risk forhepatitis B infection. Hepatitis C Testing is recommended for: Everyone born from 1945 through 1965. Anyone with known risk factors for hepatitis C. Sexually transmitted infections (STIs) Get screened for STIs, including gonorrhea and chlamydia, if: You are sexually active and are younger than 58 years of age. You are older than 58 years of age and your health care provider tells you that you are at risk for this type of infection. Your sexual activity has changed since you were last screened, and you are at increased risk for chlamydia or gonorrhea. Ask your health care provider if you are at risk. Ask your health care provider about whether you are at high risk for HIV. Your health care provider may recommend a prescription medicine to help   prevent HIV infection. If you choose to take medicine to prevent HIV, you should first get tested for HIV. You should then be tested every 3 months for as long as you are taking the medicine. Pregnancy If you are about to stop having your period (premenopausal) and you may become pregnant, seek counseling before you get  pregnant. Take 400 to 800 micrograms (mcg) of folic acid every day if you become pregnant. Ask for birth control (contraception) if you want to prevent pregnancy. Osteoporosis and menopause Osteoporosis is a disease in which the bones lose minerals and strength with aging. This can result in bone fractures. If you are 65 years old or older, or if you are at risk for osteoporosis and fractures, ask your health care provider if you should: Be screened for bone loss. Take a calcium or vitamin D supplement to lower your risk of fractures. Be given hormone replacement therapy (HRT) to treat symptoms of menopause. Follow these instructions at home: Lifestyle Do not use any products that contain nicotine or tobacco, such as cigarettes, e-cigarettes, and chewing tobacco. If you need help quitting, ask your health care provider. Do not use street drugs. Do not share needles. Ask your health care provider for help if you need support or information about quitting drugs. Alcohol use Do not drink alcohol if: Your health care provider tells you not to drink. You are pregnant, may be pregnant, or are planning to become pregnant. If you drink alcohol: Limit how much you use to 0-1 drink a day. Limit intake if you are breastfeeding. Be aware of how much alcohol is in your drink. In the [place] one drink equals one 12 oz bottle of beer (355 mL), one 5 oz glass of wine (148 mL), or one 1 oz glass of hard liquor (44 mL). General instructions Schedule regular health, dental, and eye exams. Stay current with your vaccines. Tell your health care provider if: You often feel depressed. You have ever been abused or do not feel safe at home. Summary Adopting a healthy lifestyle and getting preventive care are important in promoting health and wellness. Follow your health care provider's instructions about healthy diet, exercising, and getting tested or screened for diseases. Follow your health care provider's  instructions on monitoring your cholesterol and blood pressure. This information is not intended to replace advice given to you by your health care provider. Make sure you discuss any questions you have with your healthcare provider. Document Revised: 05/29/2018 Document Reviewed: 05/29/2018 Elsevier Patient Education  2022 [employer] Inc[employer]  

## 2021-06-02 LAB — HM MAMMOGRAPHY

## 2021-07-01 ENCOUNTER — Encounter: Payer: Self-pay | Admitting: Emergency Medicine

## 2021-07-17 ENCOUNTER — Other Ambulatory Visit: Payer: Self-pay | Admitting: Emergency Medicine

## 2021-07-17 DIAGNOSIS — E785 Hyperlipidemia, unspecified: Secondary | ICD-10-CM

## 2021-07-17 DIAGNOSIS — I10 Essential (primary) hypertension: Secondary | ICD-10-CM

## 2021-07-29 ENCOUNTER — Other Ambulatory Visit: Payer: Self-pay | Admitting: Emergency Medicine

## 2021-07-29 DIAGNOSIS — D500 Iron deficiency anemia secondary to blood loss (chronic): Secondary | ICD-10-CM

## 2021-08-15 ENCOUNTER — Other Ambulatory Visit: Payer: Self-pay

## 2021-08-15 ENCOUNTER — Encounter: Payer: Self-pay | Admitting: Emergency Medicine

## 2021-08-15 ENCOUNTER — Ambulatory Visit
Admission: EM | Admit: 2021-08-15 | Discharge: 2021-08-15 | Disposition: A | Discharge status: Home / Self Care | Payer: BC Managed Care – PPO | Attending: Emergency Medicine | Admitting: Emergency Medicine

## 2021-08-15 ENCOUNTER — Ambulatory Visit (INDEPENDENT_AMBULATORY_CARE_PROVIDER_SITE_OTHER)
Admit: 2021-08-15 | Discharge: 2021-08-15 | Disposition: A | Discharge status: Home / Self Care | Payer: BC Managed Care – PPO | Attending: Internal Medicine | Admitting: Internal Medicine

## 2021-08-15 DIAGNOSIS — M1711 Unilateral primary osteoarthritis, right knee: Secondary | ICD-10-CM | POA: Diagnosis not present

## 2021-08-15 DIAGNOSIS — M25561 Pain in right knee: Secondary | ICD-10-CM

## 2021-08-15 MED ORDER — HYDROCODONE-ACETAMINOPHEN 5-325 MG PO TABS: 1.0000 | ORAL_TABLET | ORAL | 0 refills | Status: AC | PRN

## 2021-08-15 NOTE — ED Triage Notes (Addendum)
Pt c/o right knee pain for several weeks. Denies any injury. Report about year ago was last time had injections in that knee. Denies swelling

## 2021-08-15 NOTE — ED Provider Notes (Signed)
[facility]    CSN: [id number] Arrival date & time: 08/15/21  1355    HISTORY   Chief Complaint  Patient presents with   Knee Pain   HPI Laurie Hodge is a 61 y.o. female. Patient reports worsening right knee pain for several weeks.  Patient endorses a history of positive rheumatoid factor arthritis, states she currently sees neurology for this.  EMR reviewed, neurology has been seeing her regarding rheumatoid arthritis in her upper extremities however I do not see any documentation of any knee injections or treatment for her knee by her neurology in her chart.  Patient states her neurologist has been giving her steroid knee injections the last done a year ago, patient states this is the last time she recalls having x-ray of her knee as well, I also see no documentation of any x-ray in her chart since 2014.  Patient states the pain in her knee is interfering with her work.  Denies acute injury to her knee.  Patient states that she takes meloxicam every day which used to help but it is no longer helping.  The history is provided by the patient.  Past Medical History:  Diagnosis Date   Allergy    Anemia    Arthritis    RA   Depression    External hemorrhoids    GERD (gastroesophageal reflux disease)    H. pylori infection    many years ago   Hemorrhoid    Hiatal hernia    HSV-2 infection    IgG   HTN (hypertension)    Hyperlipidemia    Hyperplastic colon polyp    Iron (Fe) deficiency anemia    Obesity, Class III, BMI 40-49.9 (morbid obesity) ([place])    OSA on CPAP    cpap broke this week   Sleep apnea    do not use her cipap   Ulcer    gastric - h. pylori positive   Vitamin D insufficiency    Patient Active Problem List   Diagnosis Date Noted   Tinea versicolor 01/07/2020   Body mass index (BMI) of 40.1-44.9 in adult ([place]) 01/07/2020   Left hand weakness 07/15/2019   Osteoarthritis 02/05/2015   Hyperlipidemia 11/06/2013   Rheumatoid arthritis ([place])  06/10/2013   BMI 36.0-36.9,adult    GERD (gastroesophageal reflux disease)    OSA (obstructive sleep apnea)     Class: Minor   HTN (hypertension)    Vitamin D insufficiency    Iron (Fe) deficiency anemia    Past Surgical History:  Procedure Laterality Date   LAPAROSCOPIC GASTRIC SLEEVE RESECTION N/A 05/31/2015   Procedure: LAPAROSCOPIC GASTRIC SLEEVE RESECTION;  Surgeon: Arta Bruce Kinsinger, MD;  Location: [facility];  Service: General;  Laterality: N/A;   TUBAL LIGATION     OB History   No obstetric history on file.    Home Medications    Prior to Admission medications   Medication Sig Start Date End Date Taking? Authorizing Provider  atorvastatin (LIPITOR) 40 MG tablet Take 1 tablet by mouth once daily 07/17/21   Horald Pollen, MD  Biotin 5 MG CAPS Take by mouth.    [provider]  calcium-vitamin D (OSCAL WITH D) 500-200 MG-UNIT tablet Take 1 tablet by mouth 3 (three) times daily.     [provider]  cetirizine (ZYRTEC) 10 MG tablet Take 10 mg by mouth daily as needed for allergies.     [provider]  cholecalciferol (VITAMIN D) 1000 UNITS tablet Take  2,000 Units by mouth daily.     [provider]  Cyanocobalamin (VITAMIN B-12) 2500 MCG SUBL Place 2,500 mcg under the tongue daily.    [provider]  diltiazem 2 % GEL Using your index finger, apply a small amount of medication inside the rectum up to your first knuckle/joint twice daily x 4 weeks. 04/23/20   Pyrtle, Lajuan Lines, MD  Fish Oil-Krill Oil (KRILL OIL PLUS) CAPS Take 750 mg by mouth daily.    [provider]  hydrochlorothiazide (HYDRODIURIL) 12.5 MG tablet Take 1 tablet (12.5 mg total) by mouth daily. 07/14/20   Horald Pollen, MD  iron polysaccharides (FERREX 150) 150 MG capsule Take 1 capsule by mouth twice daily 07/29/21   Horald Pollen, MD  lisinopril (ZESTRIL) 40 MG tablet Take 1 tablet by mouth once daily 07/17/21   Horald Pollen, MD   meloxicam (MOBIC) 7.5 MG tablet Take 1 tablet (7.5 mg total) by mouth as needed for pain. 09/25/17   Harrison Mons, PA  Multiple Vitamin (MULTIVITAMIN WITH MINERALS) TABS tablet Take 1 tablet by mouth 2 (two) times daily.    [provider]  omeprazole (PRILOSEC) 40 MG capsule TAKE 1 CAPSULE BY MOUTH  DAILY 01/18/20   Horald Pollen, MD  senna (SENOKOT) 8.6 MG TABS tablet Take 2 tablets by mouth as needed for mild constipation. Reported on 10/12/2015    [provider]    Family History Family History  Problem Relation Age of Onset   Diabetes Mother    Hypertension Mother    Dementia Mother    Stroke Mother 82   Hyperlipidemia Mother    Cancer Paternal Aunt        breast ca x 5 maternal aunts   Diabetes Sister    Heart disease Sister    Hyperlipidemia Sister    Hypertension Sister    Heart disease Father    Hyperlipidemia Father    Hypertension Father    Stroke Father    Heart disease Sister    Hyperlipidemia Sister    Hypertension Sister    Diabetes Brother    Hyperlipidemia Brother    Hypertension Brother    Hypertension Son    Colon cancer Neg Hx    Esophageal cancer Neg Hx    Rectal cancer Neg Hx    Stomach cancer Neg Hx    Social History Social History   Tobacco Use   Smoking status: Never   Smokeless tobacco: Never  Vaping Use   Vaping Use: Never used  Substance Use Topics   Alcohol use: Yes    Alcohol/week: 0.0 standard drinks    Comment: 2-3 times a year at special events   Drug use: No   Allergies   Adhesive [tape]  Review of Systems Review of Systems Pertinent findings noted in history of present illness.   Physical Exam Triage Vital Signs ED Triage Vitals  Enc Vitals Group     BP 04/15/21 0827 (!) 147/82     Pulse Rate 04/15/21 0827 72     Resp 04/15/21 0827 18     Temp 04/15/21 0827 98.3 F (36.8 C)     Temp Source 04/15/21 0827 Oral     SpO2 04/15/21 0827 98 %     Weight --      Height --      Head  Circumference --      Peak Flow --      Pain Score 04/15/21 0826 5  Pain Loc --      Pain Edu? --      Excl. in [place]? --   No data found.  Updated Vital Signs BP (!) 168/94 (BP Location: Right Arm)    Pulse 61    Temp 98.6 F (37 C) (Oral)    Resp 19    LMP 12/26/2018    SpO2 95%   Physical Exam Vitals and nursing note reviewed.  Constitutional:      General: She is not in acute distress.    Appearance: Normal appearance. She is not ill-appearing.  HENT:     Head: Normocephalic and atraumatic.  Eyes:     General: Lids are normal.        Right eye: No discharge.        Left eye: No discharge.     Extraocular Movements: Extraocular movements intact.     Conjunctiva/sclera: Conjunctivae normal.     Right eye: Right conjunctiva is not injected.     Left eye: Left conjunctiva is not injected.  Neck:     Trachea: Trachea and phonation normal.  Cardiovascular:     Rate and Rhythm: Normal rate and regular rhythm.     Pulses: Normal pulses.     Heart sounds: Normal heart sounds. No murmur heard.   No friction rub. No gallop.  Pulmonary:     Effort: Pulmonary effort is normal. No accessory muscle usage, prolonged expiration or respiratory distress.     Breath sounds: Normal breath sounds. No stridor, decreased air movement or transmitted upper airway sounds. No decreased breath sounds, wheezing, rhonchi or rales.  Chest:     Chest wall: No tenderness.  Musculoskeletal:        General: Swelling, tenderness (Medial aspect of right knee at [facility]) and deformity present. Normal range of motion.     Cervical back: Normal range of motion and neck supple. Normal range of motion.  Lymphadenopathy:     Cervical: No cervical adenopathy.  Skin:    General: Skin is warm and dry.     Findings: No erythema or rash.  Neurological:     General: No focal deficit present.     Mental Status: She is alert and oriented to person, place, and time.  Psychiatric:        Mood and Affect: Mood normal.         Behavior: Behavior normal.    Visual Acuity Right Eye Distance:   Left Eye Distance:   Bilateral Distance:    Right Eye Near:   Left Eye Near:    Bilateral Near:     UC Couse / Diagnostics / Procedures:    EKG  Radiology No results found.  Procedures Procedures (including critical care time)  UC Diagnoses / Final Clinical Impressions(s)   I have reviewed the triage vital signs and the nursing notes.  Pertinent labs & imaging results that were available during my care of the patient were reviewed by me and considered in my medical decision making (see chart for details).    Final diagnoses:  Arthritis of right knee   Patient's blood pressure is elevated on arrival today, patient states she is in a lot of pain right now and that she is taking her blood pressure medications as prescribed.  X-ray of right knee, per my personal read, is concerning for advanced osteoarthritis and osteophyte formation.  Radiology report is still pending.  Patient provided with a prescription for hydrocodone and encouraged to continue taking meloxicam.  Patient states she has an appointment in 3 days with her primary care provider, have advised her to request a referral to orthopedics for further follow-up of her knee.  ED Prescriptions     Medication Sig Dispense Auth. Provider   HYDROcodone-acetaminophen (NORCO/VICODIN) 5-325 MG tablet Take 1-2 tablets by mouth every 4 (four) hours as needed for up to 3 days. 30 tablet Lynden Oxford Scales, PA-C      I have reviewed the PDMP during this encounter.  Pending results:  Labs Reviewed - No data to display  Medications Ordered in UC: Medications - No data to display  Disposition Upon Discharge:  Condition: stable for discharge home Home: take medications as prescribed; routine discharge instructions as discussed; follow up as advised.  Patient presented with an acute illness with associated systemic symptoms and significant discomfort  requiring urgent management. In my opinion, this is a condition that a prudent lay person (someone who possesses an average knowledge of health and medicine) may potentially expect to result in complications if not addressed urgently such as respiratory distress, impairment of bodily function or dysfunction of bodily organs.   Routine symptom specific, illness specific and/or disease specific instructions were discussed with the patient and/or caregiver at length.   As such, the patient has been evaluated and assessed, work-up was performed and treatment was provided in alignment with urgent care protocols and evidence based medicine.  Patient/parent/caregiver has been advised that the patient may require follow up for further testing and treatment if the symptoms continue in spite of treatment, as clinically indicated and appropriate.  If the patient was tested for COVID-19, Influenza and/or RSV, then the patient/parent/guardian was advised to isolate at home pending the results of his/her diagnostic coronavirus test and potentially longer if theyre positive. I have also advised pt that if his/her COVID-19 test returns positive, it's recommended to self-isolate for at least 10 days after symptoms first appeared AND until fever-free for 24 hours without fever reducer AND other symptoms have improved or resolved. Discussed self-isolation recommendations as well as instructions for household member/close contacts as per the [facility] and [place] DHHS, and also gave patient the [place] packet with this information.  Patient/parent/caregiver has been advised to return to the [facility] or PCP in 3-5 days if no better; to PCP or the Emergency Department if new signs and symptoms develop, or if the current signs or symptoms continue to change or worsen for further workup, evaluation and treatment as clinically indicated and appropriate  The patient will follow up with their current PCP if and as advised. If the patient does not  currently have a PCP we will assist them in obtaining one.   The patient may need specialty follow up if the symptoms continue, in spite of conservative treatment and management, for further workup, evaluation, consultation and treatment as clinically indicated and appropriate.   Patient/parent/caregiver verbalized understanding and agreement of plan as discussed.  All questions were addressed during visit.  Please see discharge instructions below for further details of plan.  Discharge Instructions:   Discharge Instructions      I have provided you with a prescription for hydrocodone, 1-2 every 4 hours as needed for pain.  This 30 tablet supply should last you until your next appointment with your primary care provider on Thursday morning.  Once we receive the radiology report of your x-ray, we will contact you by phone to let you know what the radiologist sees.  Per my personal read, I believe that  you have some advancing osteoarthritis in your right knee.  Thank you for visiting urgent care today.  We appreciate the opportunity to participate in your care.    This office note has been dictated using Museum/gallery curator.  Unfortunately, and despite my best efforts, this method of dictation can sometimes lead to occasional typographical or grammatical errors.  I apologize in advance if this occurs.     Lynden Oxford Scales, PA-C 08/15/21 1558

## 2021-08-15 NOTE — Discharge Instructions (Addendum)
I have provided you with a prescription for hydrocodone, 1-2 every 4 hours as needed for pain.  This 30 tablet supply should last you until your next appointment with your primary care provider on Thursday morning.  Once we receive the radiology report of your x-ray, we will contact you by phone to let you know what the radiologist sees.  Per my personal read, I believe that you have some advancing osteoarthritis in your right knee.  Thank you for visiting urgent care today.  We appreciate the opportunity to participate in your care.

## 2021-08-18 ENCOUNTER — Other Ambulatory Visit: Payer: Self-pay

## 2021-08-18 ENCOUNTER — Ambulatory Visit: Payer: BC Managed Care – PPO | Admitting: Emergency Medicine

## 2021-08-18 ENCOUNTER — Encounter: Payer: Self-pay | Admitting: Emergency Medicine

## 2021-08-18 VITALS — BP 130/70 | HR 63 | Ht 65.0 in | Wt 255.0 lb

## 2021-08-18 DIAGNOSIS — M25561 Pain in right knee: Secondary | ICD-10-CM | POA: Diagnosis not present

## 2021-08-18 DIAGNOSIS — J22 Unspecified acute lower respiratory infection: Secondary | ICD-10-CM | POA: Diagnosis not present

## 2021-08-18 DIAGNOSIS — R051 Acute cough: Secondary | ICD-10-CM | POA: Diagnosis not present

## 2021-08-18 DIAGNOSIS — I10 Essential (primary) hypertension: Secondary | ICD-10-CM

## 2021-08-18 DIAGNOSIS — M059 Rheumatoid arthritis with rheumatoid factor, unspecified: Secondary | ICD-10-CM

## 2021-08-18 MED ORDER — AZITHROMYCIN 250 MG PO TABS: ORAL_TABLET | ORAL | 0 refills | Status: DC

## 2021-08-18 MED ORDER — METHYLPREDNISOLONE ACETATE 80 MG/ML IJ SUSP
80.0000 mg | Freq: Once | INTRAMUSCULAR | Status: AC
  Administered 2021-08-18: 80 mg via INTRAMUSCULAR

## 2021-08-18 NOTE — Assessment & Plan Note (Signed)
Elevated blood pressure reading in the office but normal readings at home. ?Continue lisinopril 40 and hydrochlorothiazide 12.5 mg daily. ?Dietary approaches to stop hypertension discussed. ?

## 2021-08-18 NOTE — Patient Instructions (Signed)

## 2021-08-18 NOTE — Progress Notes (Signed)
Laurie Hodge 61 y.o.   Chief Complaint  Patient presents with   Knee Pain    Right knee, x 2 weeks   Cough    X 2 months    HISTORY OF PRESENT ILLNESS: This is a 61 y.o. female complaining of 2 problems today: 1.  Pain to right knee for 2 weeks.  Patient has history of rheumatoid arthritis.  Requesting steroid injection. 2.  Cough for 2 months, lately turning productive.  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 Denies difficulty breathing, fever or chills, or any other associated symptoms.  Knee Pain   Cough Pertinent negatives include no chest pain, chills, fever, headaches, hemoptysis, rash, sore throat or shortness of breath.    Prior to Admission medications   Medication Sig Start Date End Date Taking? Authorizing Provider  atorvastatin (LIPITOR) 40 MG tablet Take 1 tablet by mouth once daily 07/17/21  Yes Ivelis Norgard, Eilleen Kempf, MD  azithromycin [facility]) 250 MG tablet Sig as indicated 08/18/21  Yes Krisandra Bueno, Eilleen Kempf, MD  Biotin 5 MG CAPS Take by mouth.   Yes [provider]  cetirizine (ZYRTEC) 10 MG tablet Take 10 mg by mouth daily as needed for allergies.    Yes [provider]  cholecalciferol (VITAMIN D) 1000 UNITS tablet Take 2,000 Units by mouth daily.    Yes [provider]  Cyanocobalamin (VITAMIN B-12) 2500 MCG SUBL Place 2,500 mcg under the tongue daily.   Yes [provider]  Fish Oil-Krill Oil (KRILL OIL PLUS) CAPS Take 750 mg by mouth daily.   Yes [provider]  hydrochlorothiazide (HYDRODIURIL) 12.5 MG tablet Take 1 tablet (12.5 mg total) by mouth daily. 07/14/20  Yes Brenden Rudman, Eilleen Kempf, MD  HYDROcodone-acetaminophen (NORCO/VICODIN) 5-325 MG tablet Take 1-2 tablets by mouth every 4 (four) hours as needed for up to 3 days. 08/15/21 08/18/21 Yes Theadora Rama Scales, PA-C  iron polysaccharides (FERREX 150) 150 MG capsule Take 1 capsule by mouth twice daily 07/29/21  Yes Charlton Boule, Eilleen Kempf, MD   lisinopril (ZESTRIL) 40 MG tablet Take 1 tablet by mouth once daily 07/17/21  Yes Vinita Prentiss, Eilleen Kempf, MD  meloxicam (MOBIC) 7.5 MG tablet Take 1 tablet (7.5 mg total) by mouth as needed for pain. 09/25/17  Yes Porfirio Oar, PA    Allergies  Allergen Reactions   Adhesive [Tape] Rash    Skin Peels    Patient Active Problem List   Diagnosis Date Noted   Tinea versicolor 01/07/2020   Body mass index (BMI) of 40.1-44.9 in adult [facility]) 01/07/2020   Left hand weakness 07/15/2019   Osteoarthritis 02/05/2015   Hyperlipidemia 11/06/2013   Rheumatoid arthritis (HCC) 06/10/2013   BMI 36.0-36.9,adult    GERD (gastroesophageal reflux disease)    OSA (obstructive sleep apnea)     Class: Minor   HTN (hypertension)    Vitamin D insufficiency    Iron (Fe) deficiency anemia     Past Medical History:  Diagnosis Date   Allergy    Anemia    Arthritis    RA   Depression    External hemorrhoids    GERD (gastroesophageal reflux disease)    H. pylori infection    many years ago   Hemorrhoid    Hiatal hernia    HSV-2 infection    IgG   HTN (hypertension)    Hyperlipidemia    Hyperplastic colon polyp    Iron (Fe) deficiency anemia    Obesity, Class III, BMI 40-49.9 (morbid obesity) (  HCC)    OSA on CPAP    cpap broke this week   Sleep apnea    do not use her cipap   Ulcer    gastric - h. pylori positive   Vitamin D insufficiency     Past Surgical History:  Procedure Laterality Date   LAPAROSCOPIC GASTRIC SLEEVE RESECTION N/A 05/31/2015   Procedure: LAPAROSCOPIC GASTRIC SLEEVE RESECTION;  Surgeon: De Blanch Kinsinger, MD;  Location: [facility];  Service: General;  Laterality: N/A;   TUBAL LIGATION      Social History   Socioeconomic History   Marital status: Divorced    Spouse name: n/a   Number of children: 3   Years of education: 12   Highest education level: Not on file  Occupational History   Occupation: TEACHER'S ASSISTANT    Employer: [employer]     Employer: [employer]  Tobacco Use   Smoking status: Never   Smokeless tobacco: Never  Vaping Use   Vaping Use: Never used  Substance and Sexual Activity   Alcohol use: Yes    Alcohol/week: 0.0 standard drinks    Comment: 2-3 times a year at special events   Drug use: No   Sexual activity: Not Currently    Partners: Male    Comment: not active since divorce from husband 2010  Other Topics Concern   Not on file  Social History Narrative   Lives with daughter. Her mother who has dementia and had a stroke lives with her during the week and her siblings take turns staying with their mother on the weekends.      Denies caffeine use    Social Determinants of Corporate investment banker Strain: Not on file  Food Insecurity: Not on file  Transportation Needs: Not on file  Physical Activity: Not on file  Stress: Not on file  Social Connections: Not on file  Intimate Partner Violence: Not on file    Family History  Problem Relation Age of Onset   Diabetes Mother    Hypertension Mother    Dementia Mother    Stroke Mother 80   Hyperlipidemia Mother    Cancer Paternal Aunt        breast ca x 5 maternal aunts   Diabetes Sister    Heart disease Sister    Hyperlipidemia Sister    Hypertension Sister    Heart disease Father    Hyperlipidemia Father    Hypertension Father    Stroke Father    Heart disease Sister    Hyperlipidemia Sister    Hypertension Sister    Diabetes Brother    Hyperlipidemia Brother    Hypertension Brother    Hypertension Son    Colon cancer Neg Hx    Esophageal cancer Neg Hx    Rectal cancer Neg Hx    Stomach cancer Neg Hx      Review of Systems  Constitutional: Negative.  Negative for chills and fever.  HENT: Negative.  Negative for congestion and sore throat.   Respiratory:  Positive for cough. Negative for hemoptysis and shortness of breath.   Cardiovascular: Negative.  Negative for chest pain and palpitations.  Gastrointestinal:   Negative for abdominal pain, diarrhea, nausea and vomiting.  Genitourinary: Negative.   Skin: Negative.  Negative for rash.  Neurological: Negative.  Negative for dizziness and headaches.  All other systems reviewed and are negative.  Today's Vitals   08/18/21 1054  BP: (!) 156/80  Pulse: 63  SpO2: 95%  Weight: 255 lb (115.7 kg)  Height: 5\' 5"  (1.651 m)   Body mass index is 42.43 kg/m.  Physical Exam Vitals reviewed.  Constitutional:      Appearance: Normal appearance.  HENT:     Head: Normocephalic.  Eyes:     Extraocular Movements: Extraocular movements intact.     Pupils: Pupils are equal, round, and reactive to light.  Cardiovascular:     Rate and Rhythm: Normal rate and regular rhythm.     Pulses: Normal pulses.     Heart sounds: Normal heart sounds.  Pulmonary:     Effort: Pulmonary effort is normal.     Breath sounds: Normal breath sounds.  Musculoskeletal:     Cervical back: No tenderness.     Comments: Right knee: No erythema or ecchymosis.  No significant swelling.  Mild medial tenderness.  Full range of motion.  Lymphadenopathy:     Cervical: No cervical adenopathy.  Skin:    General: Skin is warm and dry.  Neurological:     General: No focal deficit present.     Mental Status: She is alert and oriented to person, place, and time.  Psychiatric:        Mood and Affect: Mood normal.        Behavior: Behavior normal.     ASSESSMENT & PLAN: Problem List Items Addressed This Visit       Cardiovascular and Mediastinum   Essential hypertension    Elevated blood pressure reading in the office but normal readings at home. Continue lisinopril 40 and hydrochlorothiazide 12.5 mg daily. Dietary approaches to stop hypertension discussed.        Respiratory   Lower respiratory infection - Primary    Viral respiratory infection progressively getting worse and starting to develop secondary bacterial infection.  May benefit from antibiotic.  We will start  Zithromax today.      Relevant Medications   azithromycin (ZITHROMAX) 250 MG tablet     Musculoskeletal and Integument   Rheumatoid arthritis (HCC)     Other   Acute pain of right knee    Patient thinks this is a rheumatoid arthritis flareup.  Has appointment to see her rheumatologist next week.  Thinks corticosteroid injection may help.  Depo-Medrol 80 mg IM given today.      Other Visit Diagnoses     Acute cough          Patient Instructions  Health Maintenance, Female Adopting a healthy lifestyle and getting preventive care are important in promoting health and wellness. Ask your health care provider about: The right schedule for you to have regular tests and exams. Things you can do on your own to prevent diseases and keep yourself healthy. What should I know about diet, weight, and exercise? Eat a healthy diet  Eat a diet that includes plenty of vegetables, fruits, low-fat dairy products, and lean protein. Do not eat a lot of foods that are high in solid fats, added sugars, or sodium. Maintain a healthy weight Body mass index (BMI) is used to identify weight problems. It estimates body fat based on height and weight. Your health care provider can help determine your BMI and help you achieve or maintain a healthy weight. Get regular exercise Get regular exercise. This is one of the most important things you can do for your health. Most adults should: Exercise for at least 150 minutes each week. The exercise should increase your heart rate and make you sweat (moderate-intensity exercise).  Do strengthening exercises at least twice a week. This is in addition to the moderate-intensity exercise. Spend less time sitting. Even light physical activity can be beneficial. Watch cholesterol and blood lipids Have your blood tested for lipids and cholesterol at 59 years of age, then have this test every 5 years. Have your cholesterol levels checked more often if: Your lipid or  cholesterol levels are high. You are older than 59 years of age. You are at high risk for heart disease. What should I know about cancer screening? Depending on your health history and family history, you may need to have cancer screening at various ages. This may include screening for: Breast cancer. Cervical cancer. Colorectal cancer. Skin cancer. Lung cancer. What should I know about heart disease, diabetes, and high blood pressure? Blood pressure and heart disease High blood pressure causes heart disease and increases the risk of stroke. This is more likely to develop in people who have high blood pressure readings or are overweight. Have your blood pressure checked: Every 3-5 years if you are 86-36 years of age. Every year if you are 39 years old or older. Diabetes Have regular diabetes screenings. This checks your fasting blood sugar level. Have the screening done: Once every three years after age 50 if you are at a normal weight and have a low risk for diabetes. More often and at a younger age if you are overweight or have a high risk for diabetes. What should I know about preventing infection? Hepatitis B If you have a higher risk for hepatitis B, you should be screened for this virus. Talk with your health care provider to find out if you are at risk for hepatitis B infection. Hepatitis C Testing is recommended for: Everyone born from [birth year] through 1963. Anyone with known risk factors for hepatitis C. Sexually transmitted infections (STIs) Get screened for STIs, including gonorrhea and chlamydia, if: You are sexually active and are younger than 58 years of age. You are older than 59 years of age and your health care provider tells you that you are at risk for this type of infection. Your sexual activity has changed since you were last screened, and you are at increased risk for chlamydia or gonorrhea. Ask your health care provider if you are at risk. Ask your health care  provider about whether you are at high risk for HIV. Your health care provider may recommend a prescription medicine to help prevent HIV infection. If you choose to take medicine to prevent HIV, you should first get tested for HIV. You should then be tested every 3 months for as long as you are taking the medicine. Pregnancy If you are about to stop having your period (premenopausal) and you may become pregnant, seek counseling before you get pregnant. Take 400 to 800 micrograms (mcg) of folic acid every day if you become pregnant. Ask for birth control (contraception) if you want to prevent pregnancy. Osteoporosis and menopause Osteoporosis is a disease in which the bones lose minerals and strength with aging. This can result in bone fractures. If you are 80 years old or older, or if you are at risk for osteoporosis and fractures, ask your health care provider if you should: Be screened for bone loss. Take a calcium or vitamin D supplement to lower your risk of fractures. Be given hormone replacement therapy (HRT) to treat symptoms of menopause. Follow these instructions at home: Alcohol use Do not drink alcohol if: Your health care provider tells you not  to drink. You are pregnant, may be pregnant, or are planning to become pregnant. If you drink alcohol: Limit how much you have to: 0-1 drink a day. Know how much alcohol is in your drink. In the [place] one drink equals one 12 oz bottle of beer (355 mL), one 5 oz glass of wine (148 mL), or one 1 oz glass of hard liquor (44 mL). Lifestyle Do not use any products that contain nicotine or tobacco. These products include cigarettes, chewing tobacco, and vaping devices, such as e-cigarettes. If you need help quitting, ask your health care provider. Do not use street drugs. Do not share needles. Ask your health care provider for help if you need support or information about quitting drugs. General instructions Schedule regular health, dental, and  eye exams. Stay current with your vaccines. Tell your health care provider if: You often feel depressed. You have ever been abused or do not feel safe at home. Summary Adopting a healthy lifestyle and getting preventive care are important in promoting health and wellness. Follow your health care provider's instructions about healthy diet, exercising, and getting tested or screened for diseases. Follow your health care provider's instructions on monitoring your cholesterol and blood pressure. This information is not intended to replace advice given to you by your health care provider. Make sure you discuss any questions you have with your health care provider. Document Revised: 10/25/2020 Document Reviewed: 10/25/2020 Elsevier Patient Education  2022 Elsevier Inc.     Edwina Barth, MD [facility]

## 2021-08-18 NOTE — Assessment & Plan Note (Signed)
Viral respiratory infection progressively getting worse and starting to develop secondary bacterial infection.  May benefit from antibiotic.  We will start Zithromax today. ?

## 2021-08-18 NOTE — Assessment & Plan Note (Addendum)
Patient thinks this is a rheumatoid arthritis flareup.  Has appointment to see her rheumatologist next week.  Thinks corticosteroid injection may help.  Depo-Medrol 80 mg IM given today. ?

## 2021-10-23 ENCOUNTER — Other Ambulatory Visit: Payer: Self-pay | Admitting: Emergency Medicine

## 2021-10-23 DIAGNOSIS — I10 Essential (primary) hypertension: Secondary | ICD-10-CM

## 2021-10-28 ENCOUNTER — Other Ambulatory Visit: Payer: Self-pay | Admitting: Emergency Medicine

## 2021-10-28 DIAGNOSIS — E785 Hyperlipidemia, unspecified: Secondary | ICD-10-CM

## 2021-12-15 ENCOUNTER — Other Ambulatory Visit: Payer: Self-pay | Admitting: Emergency Medicine

## 2021-12-15 DIAGNOSIS — D500 Iron deficiency anemia secondary to blood loss (chronic): Secondary | ICD-10-CM

## 2022-01-31 ENCOUNTER — Other Ambulatory Visit: Payer: Self-pay | Admitting: Emergency Medicine

## 2022-01-31 DIAGNOSIS — I10 Essential (primary) hypertension: Secondary | ICD-10-CM

## 2022-01-31 DIAGNOSIS — E785 Hyperlipidemia, unspecified: Secondary | ICD-10-CM

## 2022-02-08 ENCOUNTER — Ambulatory Visit (INDEPENDENT_AMBULATORY_CARE_PROVIDER_SITE_OTHER): Payer: BC Managed Care – PPO | Admitting: Emergency Medicine

## 2022-02-08 ENCOUNTER — Encounter: Payer: Self-pay | Admitting: Emergency Medicine

## 2022-02-08 VITALS — BP 142/94 | HR 56 | Temp 97.9°F | Ht 65.0 in | Wt 258.1 lb

## 2022-02-08 DIAGNOSIS — I10 Essential (primary) hypertension: Secondary | ICD-10-CM | POA: Diagnosis not present

## 2022-02-08 MED ORDER — VALSARTAN-HYDROCHLOROTHIAZIDE 160-12.5 MG PO TABS: 1.0000 | ORAL_TABLET | Freq: Every day | ORAL | 3 refills | Status: DC

## 2022-02-08 NOTE — Assessment & Plan Note (Signed)
Elevated blood pressure readings at home and in the office. Recommend to stop lisinopril and hydrochlorothiazide Start valsartan HCTZ 160-12.5 mg daily. Continue monitoring blood pressure readings at home daily for the next several weeks and keep a log. Cardiovascular risks associated with hypertension discussed. Dietary approach is to stop hypertension discussed. Blood work done today. Follow-up in 3 months.

## 2022-02-08 NOTE — Patient Instructions (Signed)
Stop lisinopril and hydrochlorothiazide. Start valsartan HCTZ 160-12.5 mg daily. Continue monitoring blood pressure readings at home daily for the next several weeks and keep a log. Follow-up in 3 months. Hypertension, Adult High blood pressure (hypertension) is when the force of blood pumping through the arteries is too strong. The arteries are the blood vessels that carry blood from the heart throughout the body. Hypertension forces the heart to work harder to pump blood and may cause arteries to become narrow or stiff. Untreated or uncontrolled hypertension can lead to a heart attack, heart failure, a stroke, kidney disease, and other problems. A blood pressure reading consists of a higher number over a lower number. Ideally, your blood pressure should be below 120/80. The first ("top") number is called the systolic pressure. It is a measure of the pressure in your arteries as your heart beats. The second ("bottom") number is called the diastolic pressure. It is a measure of the pressure in your arteries as the heart relaxes. What are the causes? The exact cause of this condition is not known. There are some conditions that result in high blood pressure. What increases the risk? Certain factors may make you more likely to develop high blood pressure. Some of these risk factors are under your control, including: Smoking. Not getting enough exercise or physical activity. Being overweight. Having too much fat, sugar, calories, or salt (sodium) in your diet. Drinking too much alcohol. Other risk factors include: Having a personal history of heart disease, diabetes, high cholesterol, or kidney disease. Stress. Having a family history of high blood pressure and high cholesterol. Having obstructive sleep apnea. Age. The risk increases with age. What are the signs or symptoms? High blood pressure may not cause symptoms. Very high blood pressure (hypertensive crisis) may cause: Headache. Fast or  irregular heartbeats (palpitations). Shortness of breath. Nosebleed. Nausea and vomiting. Vision changes. Severe chest pain, dizziness, and seizures. How is this diagnosed? This condition is diagnosed by measuring your blood pressure while you are seated, with your arm resting on a flat surface, your legs uncrossed, and your feet flat on the floor. The cuff of the blood pressure monitor will be placed directly against the skin of your upper arm at the level of your heart. Blood pressure should be measured at least twice using the same arm. Certain conditions can cause a difference in blood pressure between your right and left arms. If you have a high blood pressure reading during one visit or you have normal blood pressure with other risk factors, you may be asked to: Return on a different day to have your blood pressure checked again. Monitor your blood pressure at home for 1 week or longer. If you are diagnosed with hypertension, you may have other blood or imaging tests to help your health care provider understand your overall risk for other conditions. How is this treated? This condition is treated by making healthy lifestyle changes, such as eating healthy foods, exercising more, and reducing your alcohol intake. You may be referred for counseling on a healthy diet and physical activity. Your health care provider may prescribe medicine if lifestyle changes are not enough to get your blood pressure under control and if: Your systolic blood pressure is above 130. Your diastolic blood pressure is above 80. Your personal target blood pressure may vary depending on your medical conditions, your age, and other factors. Follow these instructions at home: Eating and drinking  Eat a diet that is high in fiber and potassium, and  low in sodium, added sugar, and fat. An example of this eating plan is called the DASH diet. DASH stands for Dietary Approaches to Stop Hypertension. To eat this way: Eat  plenty of fresh fruits and vegetables. Try to fill one half of your plate at each meal with fruits and vegetables. Eat whole grains, such as whole-wheat pasta, [place] rice, or whole-grain bread. Fill about one fourth of your plate with whole grains. Eat or drink low-fat dairy products, such as skim milk or low-fat yogurt. Avoid fatty cuts of meat, processed or cured meats, and poultry with skin. Fill about one fourth of your plate with lean proteins, such as fish, chicken without skin, beans, eggs, or tofu. Avoid pre-made and processed foods. These tend to be higher in sodium, added sugar, and fat. Reduce your daily sodium intake. Many people with hypertension should eat less than 1,500 mg of sodium a day. Do not drink alcohol if: Your health care provider tells you not to drink. You are pregnant, may be pregnant, or are planning to become pregnant. If you drink alcohol: Limit how much you have to: 0-1 drink a day for women. 0-2 drinks a day for men. Know how much alcohol is in your drink. In the [place] one drink equals one 12 oz bottle of beer (355 mL), one 5 oz glass of wine (148 mL), or one 1 oz glass of hard liquor (44 mL). Lifestyle  Work with your health care provider to maintain a healthy body weight or to lose weight. Ask what an ideal weight is for you. Get at least 30 minutes of exercise that causes your heart to beat faster (aerobic exercise) most days of the week. Activities may include walking, swimming, or biking. Include exercise to strengthen your muscles (resistance exercise), such as Pilates or lifting weights, as part of your weekly exercise routine. Try to do these types of exercises for 30 minutes at least 3 days a week. Do not use any products that contain nicotine or tobacco. These products include cigarettes, chewing tobacco, and vaping devices, such as e-cigarettes. If you need help quitting, ask your health care provider. Monitor your blood pressure at home as told by  your health care provider. Keep all follow-up visits. This is important. Medicines Take over-the-counter and prescription medicines only as told by your health care provider. Follow directions carefully. Blood pressure medicines must be taken as prescribed. Do not skip doses of blood pressure medicine. Doing this puts you at risk for problems and can make the medicine less effective. Ask your health care provider about side effects or reactions to medicines that you should watch for. Contact a health care provider if you: Think you are having a reaction to a medicine you are taking. Have headaches that keep coming back (recurring). Feel dizzy. Have swelling in your ankles. Have trouble with your vision. Get help right away if you: Develop a severe headache or confusion. Have unusual weakness or numbness. Feel faint. Have severe pain in your chest or abdomen. Vomit repeatedly. Have trouble breathing. These symptoms may be an emergency. Get help right away. Call 911. Do not wait to see if the symptoms will go away. Do not drive yourself to the hospital. Summary Hypertension is when the force of blood pumping through your arteries is too strong. If this condition is not controlled, it may put you at risk for serious complications. Your personal target blood pressure may vary depending on your medical conditions, your age, and other factors.  For most people, a normal blood pressure is less than 120/80. Hypertension is treated with lifestyle changes, medicines, or a combination of both. Lifestyle changes include losing weight, eating a healthy, low-sodium diet, exercising more, and limiting alcohol. This information is not intended to replace advice given to you by your health care provider. Make sure you discuss any questions you have with your health care provider. Document Revised: 04/12/2021 Document Reviewed: 04/12/2021 Elsevier Patient Education  [place].

## 2022-02-08 NOTE — Progress Notes (Signed)
Laurie Hodge 61 y.o.   Chief Complaint  Patient presents with   Hypertension     Pt concerns about elevated BP  Pt requesting lab work     HISTORY OF PRESENT ILLNESS: This is a 61 y.o. female with history hypertension complaining of elevated blood pressure readings at home. Has been on this pill 40 and hydrochlorothiazide 12.5 mg daily for years. No other complaint or medical concerns today. BP Readings from Last 3 Encounters:  02/08/22 (!) 144/90  08/18/21 130/70  08/15/21 (!) 168/94   Wt Readings from Last 3 Encounters:  02/08/22 258 lb 2 oz (117.1 kg)  08/18/21 255 lb (115.7 kg)  12/29/20 255 lb (115.7 kg)     Hypertension Pertinent negatives include no blurred vision, chest pain, headaches, palpitations or shortness of breath.     Prior to Admission medications   Medication Sig Start Date End Date Taking? Authorizing Provider  atorvastatin (LIPITOR) 40 MG tablet Take 1 tablet (40 mg total) by mouth daily. Overdue for Annual appt must see provider for future refills 01/31/22  Yes Armond Cuthrell, [name], MD  azithromycin [facility]) 250 MG tablet Sig as indicated 08/18/21  Yes Edmar Blankenburg, [name], MD  Biotin 5 MG CAPS Take by mouth.   Yes [provider]  cetirizine (ZYRTEC) 10 MG tablet Take 10 mg by mouth daily as needed for allergies.    Yes [provider]  cholecalciferol (VITAMIN D) 1000 UNITS tablet Take 2,000 Units by mouth daily.    Yes [provider]  Cyanocobalamin (VITAMIN B-12) 2500 MCG SUBL Place 2,500 mcg under the tongue daily.   Yes [provider]  Fish Oil-Krill Oil (KRILL OIL PLUS) CAPS Take 750 mg by mouth daily.   Yes [provider]  hydrochlorothiazide (HYDRODIURIL) 12.5 MG tablet Take 1 tablet (12.5 mg total) by mouth daily. 07/14/20  Yes Horald Pollen, MD  iron polysaccharides (FERREX 150) 150 MG capsule Take 1 capsule by mouth twice daily 12/15/21  Yes Nesreen Albano, [name], MD  lisinopril  (ZESTRIL) 40 MG tablet Take 1 tablet (40 mg total) by mouth daily. Overdue for Annual appt must see provider for future refills 01/31/22  Yes Baneen Wieseler, [name], MD  meloxicam (MOBIC) 7.5 MG tablet Take 1 tablet (7.5 mg total) by mouth as needed for pain. 09/25/17  Yes Harrison Mons, PA    Allergies  Allergen Reactions   Adhesive [Tape] Rash    Skin Peels    Patient Active Problem List   Diagnosis Date Noted   Acute pain of right knee 08/18/2021   Lower respiratory infection 08/18/2021   Tinea versicolor 01/07/2020   Body mass index (BMI) of 40.1-44.9 in adult [facility]) 01/07/2020   Left hand weakness 07/15/2019   Osteoarthritis 02/05/2015   Hyperlipidemia 11/06/2013   Rheumatoid arthritis ([place]) 06/10/2013   BMI 36.0-36.9,adult    GERD (gastroesophageal reflux disease)    OSA (obstructive sleep apnea)     Class: Minor   Essential hypertension    Vitamin D insufficiency    Iron (Fe) deficiency anemia     Past Medical History:  Diagnosis Date   Allergy    Anemia    Arthritis    RA   Depression    External hemorrhoids    GERD (gastroesophageal reflux disease)    H. pylori infection    many years ago   Hemorrhoid    Hiatal hernia    HSV-2 infection    IgG   HTN (hypertension)  Hyperlipidemia    Hyperplastic colon polyp    Iron (Fe) deficiency anemia    Obesity, Class III, BMI 40-49.9 (morbid obesity) (HCC)    OSA on CPAP    cpap broke this week   Sleep apnea    do not use her cipap   Ulcer    gastric - h. pylori positive   Vitamin D insufficiency     Past Surgical History:  Procedure Laterality Date   LAPAROSCOPIC GASTRIC SLEEVE RESECTION N/A 05/31/2015   Procedure: LAPAROSCOPIC GASTRIC SLEEVE RESECTION;  Surgeon: Arta Bruce Kinsinger, MD;  Location: [facility];  Service: General;  Laterality: N/A;   TUBAL LIGATION      Social History   Socioeconomic History   Marital status: Divorced    Spouse name: n/a   Number of children: 3   Years of education: 12    Highest education level: Not on file  Occupational History   Occupation: TEACHER'S ASSISTANT    Employer: [employer]    Employer: [employer]  Tobacco Use   Smoking status: Never   Smokeless tobacco: Never  Vaping Use   Vaping Use: Never used  Substance and Sexual Activity   Alcohol use: Yes    Alcohol/week: 0.0 standard drinks of alcohol    Comment: 2-3 times a year at special events   Drug use: No   Sexual activity: Not Currently    Partners: Male    Comment: not active since divorce from husband 2010  Other Topics Concern   Not on file  Social History Narrative   Lives with daughter. Her mother who has dementia and had a stroke lives with her during the week and her siblings take turns staying with their mother on the weekends.      Denies caffeine use    Social Determinants of Radio broadcast assistant Strain: Not on file  Food Insecurity: Not on file  Transportation Needs: Not on file  Physical Activity: Not on file  Stress: Not on file  Social Connections: Not on file  Intimate Partner Violence: Not on file    Family History  Problem Relation Age of Onset   Diabetes Mother    Hypertension Mother    Dementia Mother    Stroke Mother 73   Hyperlipidemia Mother    Cancer Paternal Aunt        breast ca x 5 maternal aunts   Diabetes Sister    Heart disease Sister    Hyperlipidemia Sister    Hypertension Sister    Heart disease Father    Hyperlipidemia Father    Hypertension Father    Stroke Father    Heart disease Sister    Hyperlipidemia Sister    Hypertension Sister    Diabetes Brother    Hyperlipidemia Brother    Hypertension Brother    Hypertension Son    Colon cancer Neg Hx    Esophageal cancer Neg Hx    Rectal cancer Neg Hx    Stomach cancer Neg Hx      Review of Systems  Constitutional: Negative.  Negative for chills and fever.  HENT: Negative.    Eyes:  Negative for blurred vision and double vision.   Respiratory: Negative.  Negative for cough and shortness of breath.   Cardiovascular: Negative.  Negative for chest pain and palpitations.  Gastrointestinal:  Negative for abdominal pain, diarrhea, nausea and vomiting.  Genitourinary: Negative.   Skin: Negative.   Neurological:  Negative for dizziness and  headaches.  All other systems reviewed and are negative.  Today's Vitals   02/08/22 1600 02/08/22 1606  BP: (!) 144/90 (!) 142/94  Pulse: (!) 56   Temp: 97.9 F (36.6 C)   TempSrc: Oral   SpO2: 97%   Weight: 258 lb 2 oz (117.1 kg)   Height: '5\' 5"'$  (1.651 m)    Body mass index is 42.95 kg/m. Wt Readings from Last 3 Encounters:  02/08/22 258 lb 2 oz (117.1 kg)  08/18/21 255 lb (115.7 kg)  12/29/20 255 lb (115.7 kg)     Physical Exam Vitals reviewed.  Constitutional:      Appearance: Normal appearance.  HENT:     Head: Normocephalic.  Eyes:     Extraocular Movements: Extraocular movements intact.     Pupils: Pupils are equal, round, and reactive to light.  Cardiovascular:     Rate and Rhythm: Normal rate.  Pulmonary:     Effort: Pulmonary effort is normal.  Skin:    General: Skin is warm and dry.     Capillary Refill: Capillary refill takes less than 2 seconds.  Neurological:     General: No focal deficit present.     Mental Status: She is alert and oriented to person, place, and time.  Psychiatric:        Mood and Affect: Mood normal.        Behavior: Behavior normal.      ASSESSMENT & PLAN: A total of 49 minutes was spent with the patient and counseling/coordination of care regarding preparing for this visit, review of most recent office visit notes, review of most recent blood work results, diagnosis of hypertension and cardiovascular risk associated with this condition, review of all medications and changes made, education on nutrition, prognosis, documentation, need for follow-up.  Problem List Items Addressed This Visit       Cardiovascular and  Mediastinum   Essential hypertension - Primary    Elevated blood pressure readings at home and in the office. Recommend to stop lisinopril and hydrochlorothiazide Start valsartan HCTZ 160-12.5 mg daily. Continue monitoring blood pressure readings at home daily for the next several weeks and keep a log. Cardiovascular risks associated with hypertension discussed. Dietary approach is to stop hypertension discussed. Blood work done today. Follow-up in 3 months.      Relevant Medications   valsartan-hydrochlorothiazide (DIOVAN-HCT) 160-12.5 MG tablet   Other Relevant Orders   CBC with Differential/Platelet   Comprehensive metabolic panel   Hemoglobin A1c   Lipid panel   Patient Instructions  Stop lisinopril and hydrochlorothiazide. Start valsartan HCTZ 160-12.5 mg daily. Continue monitoring blood pressure readings at home daily for the next several weeks and keep a log. Follow-up in 3 months. Hypertension, Adult High blood pressure (hypertension) is when the force of blood pumping through the arteries is too strong. The arteries are the blood vessels that carry blood from the heart throughout the body. Hypertension forces the heart to work harder to pump blood and may cause arteries to become narrow or stiff. Untreated or uncontrolled hypertension can lead to a heart attack, heart failure, a stroke, kidney disease, and other problems. A blood pressure reading consists of a higher number over a lower number. Ideally, your blood pressure should be below 120/80. The first ("top") number is called the systolic pressure. It is a measure of the pressure in your arteries as your heart beats. The second ("bottom") number is called the diastolic pressure. It is a measure of the pressure in your  arteries as the heart relaxes. What are the causes? The exact cause of this condition is not known. There are some conditions that result in high blood pressure. What increases the risk? Certain factors may  make you more likely to develop high blood pressure. Some of these risk factors are under your control, including: Smoking. Not getting enough exercise or physical activity. Being overweight. Having too much fat, sugar, calories, or salt (sodium) in your diet. Drinking too much alcohol. Other risk factors include: Having a personal history of heart disease, diabetes, high cholesterol, or kidney disease. Stress. Having a family history of high blood pressure and high cholesterol. Having obstructive sleep apnea. Age. The risk increases with age. What are the signs or symptoms? High blood pressure may not cause symptoms. Very high blood pressure (hypertensive crisis) may cause: Headache. Fast or irregular heartbeats (palpitations). Shortness of breath. Nosebleed. Nausea and vomiting. Vision changes. Severe chest pain, dizziness, and seizures. How is this diagnosed? This condition is diagnosed by measuring your blood pressure while you are seated, with your arm resting on a flat surface, your legs uncrossed, and your feet flat on the floor. The cuff of the blood pressure monitor will be placed directly against the skin of your upper arm at the level of your heart. Blood pressure should be measured at least twice using the same arm. Certain conditions can cause a difference in blood pressure between your right and left arms. If you have a high blood pressure reading during one visit or you have normal blood pressure with other risk factors, you may be asked to: Return on a different day to have your blood pressure checked again. Monitor your blood pressure at home for 1 week or longer. If you are diagnosed with hypertension, you may have other blood or imaging tests to help your health care provider understand your overall risk for other conditions. How is this treated? This condition is treated by making healthy lifestyle changes, such as eating healthy foods, exercising more, and reducing  your alcohol intake. You may be referred for counseling on a healthy diet and physical activity. Your health care provider may prescribe medicine if lifestyle changes are not enough to get your blood pressure under control and if: Your systolic blood pressure is above 130. Your diastolic blood pressure is above 80. Your personal target blood pressure may vary depending on your medical conditions, your age, and other factors. Follow these instructions at home: Eating and drinking  Eat a diet that is high in fiber and potassium, and low in sodium, added sugar, and fat. An example of this eating plan is called the DASH diet. DASH stands for Dietary Approaches to Stop Hypertension. To eat this way: Eat plenty of fresh fruits and vegetables. Try to fill one half of your plate at each meal with fruits and vegetables. Eat whole grains, such as whole-wheat pasta, [place]a rice, or whole-grain bread. Fill about one fourth of your plate with whole grains. Eat or drink low-fat dairy products, such as skim milk or low-fat yogurt. Avoid fatty cuts of meat, processed or cured meats, and poultry with skin. Fill about one fourth of your plate with lean proteins, such as fish, chicken without skin, beans, eggs, or tofu. Avoid pre-made and processed foods. These tend to be higher in sodium, added sugar, and fat. Reduce your daily sodium intake. Many people with hypertension should eat less than 1,500 mg of sodium a day. Do not drink alcohol if: Your health care provider  tells you not to drink. You are pregnant, may be pregnant, or are planning to become pregnant. If you drink alcohol: Limit how much you have to: 0-1 drink a day for women. 0-2 drinks a day for men. Know how much alcohol is in your drink. In the [place] one drink equals one 12 oz bottle of beer (355 mL), one 5 oz glass of wine (148 mL), or one 1 oz glass of hard liquor (44 mL). Lifestyle  Work with your health care provider to maintain a healthy  body weight or to lose weight. Ask what an ideal weight is for you. Get at least 30 minutes of exercise that causes your heart to beat faster (aerobic exercise) most days of the week. Activities may include walking, swimming, or biking. Include exercise to strengthen your muscles (resistance exercise), such as Pilates or lifting weights, as part of your weekly exercise routine. Try to do these types of exercises for 30 minutes at least 3 days a week. Do not use any products that contain nicotine or tobacco. These products include cigarettes, chewing tobacco, and vaping devices, such as e-cigarettes. If you need help quitting, ask your health care provider. Monitor your blood pressure at home as told by your health care provider. Keep all follow-up visits. This is important. Medicines Take over-the-counter and prescription medicines only as told by your health care provider. Follow directions carefully. Blood pressure medicines must be taken as prescribed. Do not skip doses of blood pressure medicine. Doing this puts you at risk for problems and can make the medicine less effective. Ask your health care provider about side effects or reactions to medicines that you should watch for. Contact a health care provider if you: Think you are having a reaction to a medicine you are taking. Have headaches that keep coming back (recurring). Feel dizzy. Have swelling in your ankles. Have trouble with your vision. Get help right away if you: Develop a severe headache or confusion. Have unusual weakness or numbness. Feel faint. Have severe pain in your chest or abdomen. Vomit repeatedly. Have trouble breathing. These symptoms may be an emergency. Get help right away. Call 911. Do not wait to see if the symptoms will go away. Do not drive yourself to the hospital. Summary Hypertension is when the force of blood pumping through your arteries is too strong. If this condition is not controlled, it may put  you at risk for serious complications. Your personal target blood pressure may vary depending on your medical conditions, your age, and other factors. For most people, a normal blood pressure is less than 120/80. Hypertension is treated with lifestyle changes, medicines, or a combination of both. Lifestyle changes include losing weight, eating a healthy, low-sodium diet, exercising more, and limiting alcohol. This information is not intended to replace advice given to you by your health care provider. Make sure you discuss any questions you have with your health care provider. Document Revised: 04/12/2021 Document Reviewed: 04/12/2021 Elsevier Patient Education  [place], MD [facility]

## 2022-02-09 LAB — CBC WITH DIFFERENTIAL/PLATELET
Basophils Absolute: 0.1 10*3/uL (ref 0.0–0.1)
Basophils Relative: 1.1 % (ref 0.0–3.0)
Eosinophils Absolute: 0.2 10*3/uL (ref 0.0–0.7)
Eosinophils Relative: 3.3 % (ref 0.0–5.0)
HCT: 38.2 % (ref 36.0–46.0)
Hemoglobin: 12.7 g/dL (ref 12.0–15.0)
Lymphocytes Relative: 40.8 % (ref 12.0–46.0)
Lymphs Abs: 2.2 10*3/uL (ref 0.7–4.0)
MCHC: 33.2 g/dL (ref 30.0–36.0)
MCV: 85.7 fl (ref 78.0–100.0)
Monocytes Absolute: 0.5 10*3/uL (ref 0.1–1.0)
Monocytes Relative: 9.6 % (ref 3.0–12.0)
Neutro Abs: 2.5 10*3/uL (ref 1.4–7.7)
Neutrophils Relative %: 45.2 % (ref 43.0–77.0)
Platelets: 356 10*3/uL (ref 150.0–400.0)
RBC: 4.46 Mil/uL (ref 3.87–5.11)
RDW: 13.6 % (ref 11.5–15.5)
WBC: 5.5 10*3/uL (ref 4.0–10.5)

## 2022-02-09 LAB — LIPID PANEL
Cholesterol: 167 mg/dL (ref 0–200)
HDL: 45.5 mg/dL (ref >39.00)
LDL Cholesterol: 100 mg/dL — ABNORMAL HIGH (ref 0–99)
NonHDL: 121.84
Total CHOL/HDL Ratio: 4
Triglycerides: 110 mg/dL (ref 0.0–149.0)
VLDL: 22 mg/dL (ref 0.0–40.0)

## 2022-02-09 LAB — COMPREHENSIVE METABOLIC PANEL
ALT: 31 U/L (ref 0–35)
AST: 29 U/L (ref 0–37)
Albumin: 4.3 g/dL (ref 3.5–5.2)
Alkaline Phosphatase: 74 U/L (ref 39–117)
BUN: 13 mg/dL (ref 6–23)
CO2: 28 mEq/L (ref 19–32)
Calcium: 9.4 mg/dL (ref 8.4–10.5)
Chloride: 101 mEq/L (ref 96–112)
Creatinine, Ser: 0.7 mg/dL (ref 0.40–1.20)
GFR: 94.87 mL/min (ref >60.00)
Glucose, Bld: 76 mg/dL (ref 70–99)
Potassium: 3.5 mEq/L (ref 3.5–5.1)
Sodium: 138 mEq/L (ref 135–145)
Total Bilirubin: 0.8 mg/dL (ref 0.2–1.2)
Total Protein: 8.2 g/dL (ref 6.0–8.3)

## 2022-02-09 LAB — HEMOGLOBIN A1C: Hgb A1c MFr Bld: 6.5 % (ref 4.6–6.5)

## 2022-03-13 ENCOUNTER — Other Ambulatory Visit: Payer: Self-pay | Admitting: Emergency Medicine

## 2022-03-13 DIAGNOSIS — E785 Hyperlipidemia, unspecified: Secondary | ICD-10-CM

## 2022-04-14 ENCOUNTER — Other Ambulatory Visit: Payer: Self-pay | Admitting: Emergency Medicine

## 2022-04-14 DIAGNOSIS — D500 Iron deficiency anemia secondary to blood loss (chronic): Secondary | ICD-10-CM

## 2022-04-14 DIAGNOSIS — E785 Hyperlipidemia, unspecified: Secondary | ICD-10-CM

## 2022-04-17 ENCOUNTER — Other Ambulatory Visit: Payer: Self-pay | Admitting: Emergency Medicine

## 2022-04-17 DIAGNOSIS — D500 Iron deficiency anemia secondary to blood loss (chronic): Secondary | ICD-10-CM

## 2022-04-17 DIAGNOSIS — E785 Hyperlipidemia, unspecified: Secondary | ICD-10-CM

## 2022-05-24 ENCOUNTER — Other Ambulatory Visit: Payer: Self-pay | Admitting: Emergency Medicine

## 2022-05-24 DIAGNOSIS — E785 Hyperlipidemia, unspecified: Secondary | ICD-10-CM

## 2022-06-08 LAB — HM MAMMOGRAPHY

## 2022-08-03 ENCOUNTER — Other Ambulatory Visit: Payer: Self-pay | Admitting: Emergency Medicine

## 2022-08-03 DIAGNOSIS — D500 Iron deficiency anemia secondary to blood loss (chronic): Secondary | ICD-10-CM

## 2022-12-19 ENCOUNTER — Encounter: Payer: Self-pay | Admitting: Emergency Medicine

## 2022-12-19 ENCOUNTER — Ambulatory Visit (INDEPENDENT_AMBULATORY_CARE_PROVIDER_SITE_OTHER): Payer: BC Managed Care – PPO | Admitting: Emergency Medicine

## 2022-12-19 VITALS — BP 160/90 | HR 55 | Temp 97.9°F | Ht 65.0 in | Wt 265.5 lb

## 2022-12-19 DIAGNOSIS — M059 Rheumatoid arthritis with rheumatoid factor, unspecified: Secondary | ICD-10-CM

## 2022-12-19 DIAGNOSIS — I10 Essential (primary) hypertension: Secondary | ICD-10-CM | POA: Diagnosis not present

## 2022-12-19 DIAGNOSIS — G4733 Obstructive sleep apnea (adult) (pediatric): Secondary | ICD-10-CM | POA: Diagnosis not present

## 2022-12-19 DIAGNOSIS — E785 Hyperlipidemia, unspecified: Secondary | ICD-10-CM

## 2022-12-19 DIAGNOSIS — K219 Gastro-esophageal reflux disease without esophagitis: Secondary | ICD-10-CM

## 2022-12-19 LAB — COMPREHENSIVE METABOLIC PANEL
ALT: 19 U/L (ref 0–35)
AST: 18 U/L (ref 0–37)
Albumin: 4 g/dL (ref 3.5–5.2)
Alkaline Phosphatase: 79 U/L (ref 39–117)
BUN: 15 mg/dL (ref 6–23)
CO2: 27 mEq/L (ref 19–32)
Calcium: 9.3 mg/dL (ref 8.4–10.5)
Chloride: 102 mEq/L (ref 96–112)
Creatinine, Ser: 0.66 mg/dL (ref 0.40–1.20)
GFR: 95.65 mL/min (ref >60.00)
Glucose, Bld: 118 mg/dL — ABNORMAL HIGH (ref 70–99)
Potassium: 3.7 mEq/L (ref 3.5–5.1)
Sodium: 140 mEq/L (ref 135–145)
Total Bilirubin: 0.7 mg/dL (ref 0.2–1.2)
Total Protein: 7.8 g/dL (ref 6.0–8.3)

## 2022-12-19 LAB — LIPID PANEL
Cholesterol: 175 mg/dL (ref 0–200)
HDL: 63.9 mg/dL (ref >39.00)
LDL Cholesterol: 94 mg/dL (ref 0–99)
NonHDL: 111.4
Total CHOL/HDL Ratio: 3
Triglycerides: 87 mg/dL (ref 0.0–149.0)
VLDL: 17.4 mg/dL (ref 0.0–40.0)

## 2022-12-19 LAB — CBC WITH DIFFERENTIAL/PLATELET
Basophils Absolute: 0 10*3/uL (ref 0.0–0.1)
Basophils Relative: 0.6 % (ref 0.0–3.0)
Eosinophils Absolute: 0.2 10*3/uL (ref 0.0–0.7)
Eosinophils Relative: 2.9 % (ref 0.0–5.0)
HCT: 39.6 % (ref 36.0–46.0)
Hemoglobin: 12.8 g/dL (ref 12.0–15.0)
Lymphocytes Relative: 33.6 % (ref 12.0–46.0)
Lymphs Abs: 2 10*3/uL (ref 0.7–4.0)
MCHC: 32.3 g/dL (ref 30.0–36.0)
MCV: 86.1 fl (ref 78.0–100.0)
Monocytes Absolute: 0.4 10*3/uL (ref 0.1–1.0)
Monocytes Relative: 7.3 % (ref 3.0–12.0)
Neutro Abs: 3.3 10*3/uL (ref 1.4–7.7)
Neutrophils Relative %: 55.6 % (ref 43.0–77.0)
Platelets: 346 10*3/uL (ref 150.0–400.0)
RBC: 4.59 Mil/uL (ref 3.87–5.11)
RDW: 14 % (ref 11.5–15.5)
WBC: 6 10*3/uL (ref 4.0–10.5)

## 2022-12-19 LAB — MICROALBUMIN / CREATININE URINE RATIO
Creatinine,U: 108.4 mg/dL
Microalb Creat Ratio: 0.8 mg/g (ref 0.0–30.0)
Microalb, Ur: 0.8 mg/dL (ref 0.0–1.9)

## 2022-12-19 LAB — HEMOGLOBIN A1C: Hgb A1c MFr Bld: 6.6 % — ABNORMAL HIGH (ref 4.6–6.5)

## 2022-12-19 MED ORDER — AMLODIPINE BESYLATE 5 MG PO TABS: 5.0000 mg | ORAL_TABLET | Freq: Every day | ORAL | 3 refills | Status: DC

## 2022-12-19 NOTE — Assessment & Plan Note (Signed)
Uncontrolled hypertension Cardiovascular risks associated with hypertension discussed Diet and nutrition discussed Continue valsartan HCT 160-12.5 mg Start amlodipine 5 mg daily Advised to continue monitoring blood pressure readings at home daily for the next several weeks and keep a log.  Advised to contact the office if numbers persistently abnormal

## 2022-12-19 NOTE — Assessment & Plan Note (Signed)
Stable.  Pain management discussed 

## 2022-12-19 NOTE — Assessment & Plan Note (Signed)
Cardiovascular risks associated with dyslipidemia discussed Diet and nutrition discussed Lipid profile done today Continue atorvastatin 40 mg daily The 10-year ASCVD risk score (Arnett DK, et al., 2019) is: 11.8%   Values used to calculate the score:     Age: 62 years     Sex: Female     Is Non-Hispanic African American: Yes     Diabetic: No     Tobacco smoker: No     Systolic Blood Pressure: 160 mmHg     Is BP treated: Yes     HDL Cholesterol: 45.5 mg/dL     Total Cholesterol: 167 mg/dL

## 2022-12-19 NOTE — Assessment & Plan Note (Signed)
Stable.  On CPAP treatment. 

## 2022-12-19 NOTE — Assessment & Plan Note (Signed)
Diet and nutrition discussed Benefits of exercise discussed Advised to decrease amount of daily carbohydrate intake and daily calories and increase amount of plant based protein in her diet. 

## 2022-12-19 NOTE — Progress Notes (Signed)
Laurie Hodge 62 y.o.   Chief Complaint  Patient presents with   Annual Exam    Patient states she doesn't think her BP is controlled well, pt has been running high at home     HISTORY OF PRESENT ILLNESS: This is a 62 y.o. female A1A with history of hypertension here for follow-up Elevated blood pressure readings at home No other complaints or medical concerns today. BP Readings from Last 3 Encounters:  02/08/22 (!) 142/94  08/18/21 130/70  08/15/21 (!) 168/94     HPI   Prior to Admission medications   Medication Sig Start Date End Date Taking? Authorizing Provider  atorvastatin (LIPITOR) 40 MG tablet Take 1 tablet by mouth once daily 05/25/22  Yes Amer Alcindor, Eilleen Kempf, MD  Biotin 5 MG CAPS Take by mouth.   Yes [provider]  cetirizine (ZYRTEC) 10 MG tablet Take 10 mg by mouth daily as needed for allergies.    Yes [provider]  cholecalciferol (VITAMIN D) 1000 UNITS tablet Take 2,000 Units by mouth daily.    Yes [provider]  Cyanocobalamin (VITAMIN B-12) 2500 MCG SUBL Place 2,500 mcg under the tongue daily.   Yes [provider]  Fish Oil-Krill Oil (KRILL OIL PLUS) CAPS Take 750 mg by mouth daily.   Yes [provider]  iron polysaccharides (FERREX 150) 150 MG capsule Take 1 capsule by mouth twice daily 08/04/22  Yes Arbie Blankley, Eilleen Kempf, MD  meloxicam (MOBIC) 7.5 MG tablet Take 1 tablet (7.5 mg total) by mouth as needed for pain. 09/25/17  Yes Jeffery, Chelle, PA  valsartan-hydrochlorothiazide (DIOVAN-HCT) 160-12.5 MG tablet Take 1 tablet by mouth daily. 02/08/22  Yes Georgina Quint, MD    Allergies  Allergen Reactions   Adhesive [Tape] Rash    Skin Peels    Patient Active Problem List   Diagnosis Date Noted   Tinea versicolor 01/07/2020   Body mass index (BMI) of 40.1-44.9 in adult [facility]) 01/07/2020   Left hand weakness 07/15/2019   Osteoarthritis 02/05/2015   Hyperlipidemia 11/06/2013   Rheumatoid  arthritis (HCC) 06/10/2013   BMI 36.0-36.9,adult    GERD (gastroesophageal reflux disease)    OSA (obstructive sleep apnea)     Class: Minor   Essential hypertension    Vitamin D insufficiency    Iron (Fe) deficiency anemia     Past Medical History:  Diagnosis Date   Allergy    Anemia    Arthritis    RA   Depression    External hemorrhoids    GERD (gastroesophageal reflux disease)    H. pylori infection    many years ago   Hemorrhoid    Hiatal hernia    HSV-2 infection    IgG   HTN (hypertension)    Hyperlipidemia    Hyperplastic colon polyp    Iron (Fe) deficiency anemia    Obesity, Class III, BMI 40-49.9 (morbid obesity) (HCC)    OSA on CPAP    cpap broke this week   Sleep apnea    do not use her cipap   Ulcer    gastric - h. pylori positive   Vitamin D insufficiency     Past Surgical History:  Procedure Laterality Date   LAPAROSCOPIC GASTRIC SLEEVE RESECTION N/A 05/31/2015   Procedure: LAPAROSCOPIC GASTRIC SLEEVE RESECTION;  Surgeon: Rodman Pickle, MD;  Location: [facility];  Service: General;  Laterality: N/A;   TUBAL LIGATION      Social History   Socioeconomic History  Marital status: Divorced    Spouse name: n/a   Number of children: 3   Years of education: 12   Highest education level: Not on file  Occupational History   Occupation: TEACHER'S ASSISTANT    Employer: [employer]    Employer: [employer]  Tobacco Use   Smoking status: Never   Smokeless tobacco: Never  Vaping Use   Vaping Use: Never used  Substance and Sexual Activity   Alcohol use: Yes    Alcohol/week: 0.0 standard drinks of alcohol    Comment: 2-3 times a year at special events   Drug use: No   Sexual activity: Not Currently    Partners: Male    Comment: not active since divorce from husband 2010  Other Topics Concern   Not on file  Social History Narrative   Lives with daughter. Her mother who has dementia and had a stroke lives with her  during the week and her siblings take turns staying with their mother on the weekends.      Denies caffeine use    Social Determinants of Corporate investment banker Strain: Not on file  Food Insecurity: Not on file  Transportation Needs: Not on file  Physical Activity: Not on file  Stress: Not on file  Social Connections: Not on file  Intimate Partner Violence: Not on file    Family History  Problem Relation Age of Onset   Diabetes Mother    Hypertension Mother    Dementia Mother    Stroke Mother 4   Hyperlipidemia Mother    Cancer Paternal Aunt        breast ca x 5 maternal aunts   Diabetes Sister    Heart disease Sister    Hyperlipidemia Sister    Hypertension Sister    Heart disease Father    Hyperlipidemia Father    Hypertension Father    Stroke Father    Heart disease Sister    Hyperlipidemia Sister    Hypertension Sister    Diabetes Brother    Hyperlipidemia Brother    Hypertension Brother    Hypertension Son    Colon cancer Neg Hx    Esophageal cancer Neg Hx    Rectal cancer Neg Hx    Stomach cancer Neg Hx      Review of Systems  Constitutional: Negative.  Negative for chills and fever.  HENT: Negative.  Negative for congestion and sore throat.   Respiratory: Negative.  Negative for cough and shortness of breath.   Cardiovascular: Negative.  Negative for chest pain and palpitations.  Gastrointestinal:  Negative for abdominal pain, nausea and vomiting.  Genitourinary: Negative.   Skin: Negative.  Negative for rash.  Neurological: Negative.  Negative for dizziness and headaches.  All other systems reviewed and are negative.   Today's Vitals   12/19/22 0856  BP: (!) 160/90  Pulse: (!) 55  Temp: 97.9 F (36.6 C)  TempSrc: Oral  SpO2: 96%  Weight: 265 lb 8 oz (120.4 kg)  Height: 5\' 5"  (1.651 m)   Body mass index is 44.18 kg/m.   Physical Exam Vitals reviewed.  Constitutional:      Appearance: Normal appearance. She is obese.  HENT:      Head: Normocephalic.     Mouth/Throat:     Mouth: Mucous membranes are moist.     Pharynx: Oropharynx is clear.  Eyes:     Extraocular Movements: Extraocular movements intact.     Pupils: Pupils are  equal, round, and reactive to light.  Cardiovascular:     Rate and Rhythm: Normal rate and regular rhythm.     Pulses: Normal pulses.     Heart sounds: Normal heart sounds.  Pulmonary:     Effort: Pulmonary effort is normal.     Breath sounds: Normal breath sounds.  Musculoskeletal:     Cervical back: No tenderness.  Lymphadenopathy:     Cervical: No cervical adenopathy.  Skin:    General: Skin is warm and dry.  Neurological:     General: No focal deficit present.     Mental Status: She is alert and oriented to person, place, and time.  Psychiatric:        Mood and Affect: Mood normal.        Behavior: Behavior normal.      ASSESSMENT & PLAN: A total of 44 minutes was spent with the patient and counseling/coordination of care regarding preparing for this visit, review of most recent office visit notes, review of multiple chronic medical conditions and their treatment, review of all medications, cardiovascular risks associated with hypertension and dyslipidemia, education on nutrition, review of most recent blood work results, prognosis, documentation, and need for follow-up.  Problem List Items Addressed This Visit       Cardiovascular and Mediastinum   Essential hypertension - Primary    Uncontrolled hypertension Cardiovascular risks associated with hypertension discussed Diet and nutrition discussed Continue valsartan HCT 160-12.5 mg Start amlodipine 5 mg daily Advised to continue monitoring blood pressure readings at home daily for the next several weeks and keep a log.  Advised to contact the office if numbers persistently abnormal      Relevant Medications   amLODipine (NORVASC) 5 MG tablet   Other Relevant Orders   CBC with Differential/Platelet   Comprehensive  metabolic panel   Hemoglobin A1c   Lipid panel   Urine Microalbumin w/creat. ratio     Respiratory   OSA (obstructive sleep apnea)     Digestive   GERD (gastroesophageal reflux disease)    Stable and well-controlled        Musculoskeletal and Integument   Rheumatoid arthritis (HCC)    Stable.  Pain management discussed.        Other   Dyslipidemia    Cardiovascular risks associated with dyslipidemia discussed Diet and nutrition discussed Lipid profile done today Continue atorvastatin 40 mg daily The 10-year ASCVD risk score (Arnett DK, et al., 2019) is: 11.8%   Values used to calculate the score:     Age: 88 years     Sex: Female     Is Non-Hispanic African American: Yes     Diabetic: No     Tobacco smoker: No     Systolic Blood Pressure: 160 mmHg     Is BP treated: Yes     HDL Cholesterol: 45.5 mg/dL     Total Cholesterol: 167 mg/dL       Relevant Orders   CBC with Differential/Platelet   Comprehensive metabolic panel   Hemoglobin A1c   Lipid panel   Morbid obesity (HCC)    Diet and nutrition discussed Benefits of exercise discussed Advised to decrease amount of daily carbohydrate intake and daily calories and increase amount of plant based protein in her diet      Patient Instructions  Hypertension, Adult High blood pressure (hypertension) is when the force of blood pumping through the arteries is too strong. The arteries are the blood vessels that carry blood from  the heart throughout the body. Hypertension forces the heart to work harder to pump blood and may cause arteries to become narrow or stiff. Untreated or uncontrolled hypertension can lead to a heart attack, heart failure, a stroke, kidney disease, and other problems. A blood pressure reading consists of a higher number over a lower number. Ideally, your blood pressure should be below 120/80. The first ("top") number is called the systolic pressure. It is a measure of the pressure in your arteries as  your heart beats. The second ("bottom") number is called the diastolic pressure. It is a measure of the pressure in your arteries as the heart relaxes. What are the causes? The exact cause of this condition is not known. There are some conditions that result in high blood pressure. What increases the risk? Certain factors may make you more likely to develop high blood pressure. Some of these risk factors are under your control, including: Smoking. Not getting enough exercise or physical activity. Being overweight. Having too much fat, sugar, calories, or salt (sodium) in your diet. Drinking too much alcohol. Other risk factors include: Having a personal history of heart disease, diabetes, high cholesterol, or kidney disease. Stress. Having a family history of high blood pressure and high cholesterol. Having obstructive sleep apnea. Age. The risk increases with age. What are the signs or symptoms? High blood pressure may not cause symptoms. Very high blood pressure (hypertensive crisis) may cause: Headache. Fast or irregular heartbeats (palpitations). Shortness of breath. Nosebleed. Nausea and vomiting. Vision changes. Severe chest pain, dizziness, and seizures. How is this diagnosed? This condition is diagnosed by measuring your blood pressure while you are seated, with your arm resting on a flat surface, your legs uncrossed, and your feet flat on the floor. The cuff of the blood pressure monitor will be placed directly against the skin of your upper arm at the level of your heart. Blood pressure should be measured at least twice using the same arm. Certain conditions can cause a difference in blood pressure between your right and left arms. If you have a high blood pressure reading during one visit or you have normal blood pressure with other risk factors, you may be asked to: Return on a different day to have your blood pressure checked again. Monitor your blood pressure at home for 1  week or longer. If you are diagnosed with hypertension, you may have other blood or imaging tests to help your health care provider understand your overall risk for other conditions. How is this treated? This condition is treated by making healthy lifestyle changes, such as eating healthy foods, exercising more, and reducing your alcohol intake. You may be referred for counseling on a healthy diet and physical activity. Your health care provider may prescribe medicine if lifestyle changes are not enough to get your blood pressure under control and if: Your systolic blood pressure is above 130. Your diastolic blood pressure is above 80. Your personal target blood pressure may vary depending on your medical conditions, your age, and other factors. Follow these instructions at home: Eating and drinking  Eat a diet that is high in fiber and potassium, and low in sodium, added sugar, and fat. An example of this eating plan is called the DASH diet. DASH stands for Dietary Approaches to Stop Hypertension. To eat this way: Eat plenty of fresh fruits and vegetables. Try to fill one half of your plate at each meal with fruits and vegetables. Eat whole grains, such as whole-wheat pasta,  [place] rice, or whole-grain bread. Fill about one fourth of your plate with whole grains. Eat or drink low-fat dairy products, such as skim milk or low-fat yogurt. Avoid fatty cuts of meat, processed or cured meats, and poultry with skin. Fill about one fourth of your plate with lean proteins, such as fish, chicken without skin, beans, eggs, or tofu. Avoid pre-made and processed foods. These tend to be higher in sodium, added sugar, and fat. Reduce your daily sodium intake. Many people with hypertension should eat less than 1,500 mg of sodium a day. Do not drink alcohol if: Your health care provider tells you not to drink. You are pregnant, may be pregnant, or are planning to become pregnant. If you drink alcohol: Limit how  much you have to: 0-1 drink a day for women. 0-2 drinks a day for men. Know how much alcohol is in your drink. In the [place] one drink equals one 12 oz bottle of beer (355 mL), one 5 oz glass of wine (148 mL), or one 1 oz glass of hard liquor (44 mL). Lifestyle  Work with your health care provider to maintain a healthy body weight or to lose weight. Ask what an ideal weight is for you. Get at least 30 minutes of exercise that causes your heart to beat faster (aerobic exercise) most days of the week. Activities may include walking, swimming, or biking. Include exercise to strengthen your muscles (resistance exercise), such as Pilates or lifting weights, as part of your weekly exercise routine. Try to do these types of exercises for 30 minutes at least 3 days a week. Do not use any products that contain nicotine or tobacco. These products include cigarettes, chewing tobacco, and vaping devices, such as e-cigarettes. If you need help quitting, ask your health care provider. Monitor your blood pressure at home as told by your health care provider. Keep all follow-up visits. This is important. Medicines Take over-the-counter and prescription medicines only as told by your health care provider. Follow directions carefully. Blood pressure medicines must be taken as prescribed. Do not skip doses of blood pressure medicine. Doing this puts you at risk for problems and can make the medicine less effective. Ask your health care provider about side effects or reactions to medicines that you should watch for. Contact a health care provider if you: Think you are having a reaction to a medicine you are taking. Have headaches that keep coming back (recurring). Feel dizzy. Have swelling in your ankles. Have trouble with your vision. Get help right away if you: Develop a severe headache or confusion. Have unusual weakness or numbness. Feel faint. Have severe pain in your chest or abdomen. Vomit  repeatedly. Have trouble breathing. These symptoms may be an emergency. Get help right away. Call 911. Do not wait to see if the symptoms will go away. Do not drive yourself to the hospital. Summary Hypertension is when the force of blood pumping through your arteries is too strong. If this condition is not controlled, it may put you at risk for serious complications. Your personal target blood pressure may vary depending on your medical conditions, your age, and other factors. For most people, a normal blood pressure is less than 120/80. Hypertension is treated with lifestyle changes, medicines, or a combination of both. Lifestyle changes include losing weight, eating a healthy, low-sodium diet, exercising more, and limiting alcohol. This information is not intended to replace advice given to you by your health care provider. Make sure you discuss any  questions you have with your health care provider. Document Revised: 04/12/2021 Document Reviewed: 04/12/2021 Elsevier Patient Education  2024 Elsevier Inc.     Edwina Barth, MD [facility]

## 2022-12-19 NOTE — Patient Instructions (Signed)
Hypertension, Adult High blood pressure (hypertension) is when the force of blood pumping through the arteries is too strong. The arteries are the blood vessels that carry blood from the heart throughout the body. Hypertension forces the heart to work harder to pump blood and may cause arteries to become narrow or stiff. Untreated or uncontrolled hypertension can lead to a heart attack, heart failure, a stroke, kidney disease, and other problems. A blood pressure reading consists of a higher number over a lower number. Ideally, your blood pressure should be below 120/80. The first ("top") number is called the systolic pressure. It is a measure of the pressure in your arteries as your heart beats. The second ("bottom") number is called the diastolic pressure. It is a measure of the pressure in your arteries as the heart relaxes. What are the causes? The exact cause of this condition is not known. There are some conditions that result in high blood pressure. What increases the risk? Certain factors may make you more likely to develop high blood pressure. Some of these risk factors are under your control, including: Smoking. Not getting enough exercise or physical activity. Being overweight. Having too much fat, sugar, calories, or salt (sodium) in your diet. Drinking too much alcohol. Other risk factors include: Having a personal history of heart disease, diabetes, high cholesterol, or kidney disease. Stress. Having a family history of high blood pressure and high cholesterol. Having obstructive sleep apnea. Age. The risk increases with age. What are the signs or symptoms? High blood pressure may not cause symptoms. Very high blood pressure (hypertensive crisis) may cause: Headache. Fast or irregular heartbeats (palpitations). Shortness of breath. Nosebleed. Nausea and vomiting. Vision changes. Severe chest pain, dizziness, and seizures. How is this diagnosed? This condition is diagnosed by  measuring your blood pressure while you are seated, with your arm resting on a flat surface, your legs uncrossed, and your feet flat on the floor. The cuff of the blood pressure monitor will be placed directly against the skin of your upper arm at the level of your heart. Blood pressure should be measured at least twice using the same arm. Certain conditions can cause a difference in blood pressure between your right and left arms. If you have a high blood pressure reading during one visit or you have normal blood pressure with other risk factors, you may be asked to: Return on a different day to have your blood pressure checked again. Monitor your blood pressure at home for 1 week or longer. If you are diagnosed with hypertension, you may have other blood or imaging tests to help your health care provider understand your overall risk for other conditions. How is this treated? This condition is treated by making healthy lifestyle changes, such as eating healthy foods, exercising more, and reducing your alcohol intake. You may be referred for counseling on a healthy diet and physical activity. Your health care provider may prescribe medicine if lifestyle changes are not enough to get your blood pressure under control and if: Your systolic blood pressure is above 130. Your diastolic blood pressure is above 80. Your personal target blood pressure may vary depending on your medical conditions, your age, and other factors. Follow these instructions at home: Eating and drinking  Eat a diet that is high in fiber and potassium, and low in sodium, added sugar, and fat. An example of this eating plan is called the DASH diet. DASH stands for Dietary Approaches to Stop Hypertension. To eat this way: Eat   plenty of fresh fruits and vegetables. Try to fill one half of your plate at each meal with fruits and vegetables. Eat whole grains, such as whole-wheat pasta, [place] rice, or whole-grain bread. Fill about one  fourth of your plate with whole grains. Eat or drink low-fat dairy products, such as skim milk or low-fat yogurt. Avoid fatty cuts of meat, processed or cured meats, and poultry with skin. Fill about one fourth of your plate with lean proteins, such as fish, chicken without skin, beans, eggs, or tofu. Avoid pre-made and processed foods. These tend to be higher in sodium, added sugar, and fat. Reduce your daily sodium intake. Many people with hypertension should eat less than 1,500 mg of sodium a day. Do not drink alcohol if: Your health care provider tells you not to drink. You are pregnant, may be pregnant, or are planning to become pregnant. If you drink alcohol: Limit how much you have to: 0-1 drink a day for women. 0-2 drinks a day for men. Know how much alcohol is in your drink. In the [place] one drink equals one 12 oz bottle of beer (355 mL), one 5 oz glass of wine (148 mL), or one 1 oz glass of hard liquor (44 mL). Lifestyle  Work with your health care provider to maintain a healthy body weight or to lose weight. Ask what an ideal weight is for you. Get at least 30 minutes of exercise that causes your heart to beat faster (aerobic exercise) most days of the week. Activities may include walking, swimming, or biking. Include exercise to strengthen your muscles (resistance exercise), such as Pilates or lifting weights, as part of your weekly exercise routine. Try to do these types of exercises for 30 minutes at least 3 days a week. Do not use any products that contain nicotine or tobacco. These products include cigarettes, chewing tobacco, and vaping devices, such as e-cigarettes. If you need help quitting, ask your health care provider. Monitor your blood pressure at home as told by your health care provider. Keep all follow-up visits. This is important. Medicines Take over-the-counter and prescription medicines only as told by your health care provider. Follow directions carefully. Blood  pressure medicines must be taken as prescribed. Do not skip doses of blood pressure medicine. Doing this puts you at risk for problems and can make the medicine less effective. Ask your health care provider about side effects or reactions to medicines that you should watch for. Contact a health care provider if you: Think you are having a reaction to a medicine you are taking. Have headaches that keep coming back (recurring). Feel dizzy. Have swelling in your ankles. Have trouble with your vision. Get help right away if you: Develop a severe headache or confusion. Have unusual weakness or numbness. Feel faint. Have severe pain in your chest or abdomen. Vomit repeatedly. Have trouble breathing. These symptoms may be an emergency. Get help right away. Call 911. Do not wait to see if the symptoms will go away. Do not drive yourself to the hospital. Summary Hypertension is when the force of blood pumping through your arteries is too strong. If this condition is not controlled, it may put you at risk for serious complications. Your personal target blood pressure may vary depending on your medical conditions, your age, and other factors. For most people, a normal blood pressure is less than 120/80. Hypertension is treated with lifestyle changes, medicines, or a combination of both. Lifestyle changes include losing weight, eating a healthy,   low-sodium diet, exercising more, and limiting alcohol. This information is not intended to replace advice given to you by your health care provider. Make sure you discuss any questions you have with your health care provider. Document Revised: 04/12/2021 Document Reviewed: 04/12/2021 Elsevier Patient Education  2024 Elsevier Inc.  

## 2022-12-19 NOTE — Assessment & Plan Note (Signed)
Stable and well controlled. 

## 2022-12-26 ENCOUNTER — Other Ambulatory Visit: Payer: Self-pay | Admitting: Emergency Medicine

## 2022-12-26 DIAGNOSIS — D500 Iron deficiency anemia secondary to blood loss (chronic): Secondary | ICD-10-CM

## 2023-03-04 ENCOUNTER — Encounter: Payer: Self-pay | Admitting: Emergency Medicine

## 2023-04-11 ENCOUNTER — Other Ambulatory Visit: Payer: Self-pay | Admitting: Emergency Medicine

## 2023-04-11 DIAGNOSIS — D500 Iron deficiency anemia secondary to blood loss (chronic): Secondary | ICD-10-CM

## 2023-04-11 DIAGNOSIS — I10 Essential (primary) hypertension: Secondary | ICD-10-CM

## 2023-04-17 ENCOUNTER — Encounter (INDEPENDENT_AMBULATORY_CARE_PROVIDER_SITE_OTHER): Payer: Self-pay | Admitting: Family Medicine

## 2023-04-17 ENCOUNTER — Ambulatory Visit (INDEPENDENT_AMBULATORY_CARE_PROVIDER_SITE_OTHER): Payer: BC Managed Care – PPO | Admitting: Family Medicine

## 2023-04-17 VITALS — BP 140/85 | HR 63 | Temp 98.7°F | Ht 65.0 in | Wt 268.0 lb

## 2023-04-17 DIAGNOSIS — G4733 Obstructive sleep apnea (adult) (pediatric): Secondary | ICD-10-CM | POA: Diagnosis not present

## 2023-04-17 DIAGNOSIS — Z0289 Encounter for other administrative examinations: Secondary | ICD-10-CM

## 2023-04-17 DIAGNOSIS — Z9884 Bariatric surgery status: Secondary | ICD-10-CM

## 2023-04-17 DIAGNOSIS — Z6841 Body Mass Index (BMI) 40.0 and over, adult: Secondary | ICD-10-CM | POA: Diagnosis not present

## 2023-04-17 NOTE — Progress Notes (Unsigned)
Laurie Grippe, DO, ABFM, ABOM Bariatric physician [address] [place], [place], [place] [address] Office: [phone]  /  Fax: [phone]      Initial Evaluation:  Laurie Hodge was seen in clinic today to evaluate for obesity. She is interested in losing weight to improve overall health and reduce the risk of weight related complications. She presents today to review program treatment options, initial physical assessment, and evaluation.      She was referred by: PCP  When asked how has your weight affected you? She states: Has affected self-esteem, Relationships, Contributed to medical problems, and Has affected mood (depression and/or anxiety).  Contributing factors to her weight change: Family history, Stress, Reduced physical activity, and Eating patterns. Pt reports hx of pre-diabetes per her latest blood work with PCP.  Has strong family history of diabetes and obesity.   Some associated conditions: Hypertension, Arthritis: rheumatoid, Hyperlipidemia, OSA, Prediabetes, and GERD  Current nutrition plan: None  Current level of physical activity: None  Current or previous pharmacotherapy: None  Response to medication: Never tried medications   Past Medical History:  Diagnosis Date   Allergy    Anemia    Arthritis    RA   Depression    External hemorrhoids    GERD (gastroesophageal reflux disease)    H. pylori infection    many years ago   Hemorrhoid    Hiatal hernia    HSV-2 infection    IgG   HTN (hypertension)    Hyperlipidemia    Hyperplastic colon polyp    Iron (Fe) deficiency anemia    Obesity, Class III, BMI 40-49.9 (morbid obesity) (HCC)    OSA on CPAP    cpap broke this week   Sleep apnea    do not use her cipap   Ulcer    gastric - h. pylori positive   Vitamin D insufficiency      Objective:  BP (!) 140/85   Pulse 63   Temp 98.7 F (37.1 C)   Ht 5\' 5"  (1.651 m)   Wt 268 lb (121.6 kg)   LMP 12/26/2018   SpO2 99%   BMI 44.60 kg/m   She was weighed on the bioimpedance scale: Body mass index is 44.6 kg/m.  Visceral Fat %: 18 , Body Fat %: 51.5    Vitals Temp: 98.7 F (37.1 C) BP: (!) 140/85 Pulse Rate: 63 SpO2: 99 %   Anthropometric Measurements Height: 5\' 5"  (1.651 m) Weight: 268 lb (121.6 kg) BMI (Calculated): 44.6   Body Composition  Body Fat %: 51.5 % Fat Mass (lbs): 138.2 lbs Muscle Mass (lbs): 123.6 lbs Total Body Water (lbs): 95.4 lbs Visceral Fat Rating : 18   Other Clinical Data Comments: info session     General: Well Developed, well nourished, and in no acute distress.  HEENT: Normocephalic, atraumatic Skin: Warm and dry, good turgor Chest:  Normal excursion, shape, no gross ABN Respiratory: no conversational dyspnea; speaking in full sentences NeuroM-Sk:  normal gross ROM * 4 extremities  Psych: A and O *3, insight adequate, mood- full    Assessment and Plan:   OSA (obstructive sleep apnea) Assessment & Plan: First diagnosed about 10 years ago. Started CPAP around that time, but not currently using  ( past 5 yrs) due to the CPAP manufacturing company going out of business.   Educated patient on how poorly controlled obstructive sleep apnea (OSA) can increase the risk of many diseases, including: - An increased risk of coronary  artery disease, heart attack, heart failure, and arrhythmias as well as increased risk of stroke - An increased the risk of HBP, T2DM, and dementia.  - As well as an increased risk of CA Other complications of OSA include depression and sleeplessness-related accidents. The worse the OSA, the greater the risk of these complications.   - I recommend she contact her PCP or sleep medicine doctor to discuss a referral for sleep study to reevaluate her sleep apnea and possible restart CPAP/ BiPap.    History of weight loss surgery - gastric sleeve 2012 Assessment & Plan: Pt reports having a gastric sleeve resection at [facility] in 2012. We  briefly discussed how her procedure has effected her health and weight. Was able to lose weight at the time but has gained some weight back. Pt unaware of her post-op nadir weight.  Education performed on weight regain, natural progression to weight plateaus etc. Will keep this in consideration as it pertains to her weight loss journey should she decide to enroll in the program.    BMI 40.0-44.9, adult (HCC) Morbid obesity (HCC) Assessment & Plan: - We discussed obesity as a disease and the importance of a more detailed evaluation of all the factors contributing to the disease.  - We reviewed weight, biometrics, associated medical conditions and contributing factors with patient. she would benefit from weight loss therapy via a modified calorie, low-carb, high-protein nutritional plan tailored to their REE (resting energy expenditure) which will be determined by indirect calorimetry at their next office visit.     Multifaceted obesity treatment plan: Action Plan: - she was weighed on the bioimpedance scale and results were discussed and documented in the synopsis.   Marliss Coots will complete provided nutritional and psychosocial assessment questionnaire before the next appointment.  - she will be scheduled for indirect calorimetry to determine resting energy expenditure in a fasting state.  This will allow [place] to create a reduced calorie, high-protein meal plan to promote loss of fat mass while preserving muscle mass.  - We will also assess for cardiometabolic risk and nutritional derangements via an ECG and fasting serologies at her next appointment.  - she was encouraged to work on amassing support from family and friends to begin their weight loss journey.   - Work on eliminating or reducing the presence of highly processed, poorly nutritious, calorie-dense foods in the home.  Obesity Education Performed Today: - Patient was counseled on nutritional approaches to weight loss and  benefits of reducing processed foods and consuming plant-based foods and high quality protein as part of nutritional weight management program.   - We discussed the importance of long term lifestyle changes which include nutrition, exercise and behavioral modifications as well as the importance of customizing this to her specific health and social needs.   - We discussed the benefits of reaching a healthier weight to alleviate the symptoms of existing conditions and reduce the risks of the biomechanical, metabolic and psychological effects of obesity.  - Was counseled on the health benefits of losing 5%-10% of total body weight.  - Was counseled on our cognitive behavorial therapy program, lead by our bariatric psychologist, who focuses on emotional eating and creating positive behavorial change.  - Was counseled on bariatric pharmacotherapy and how this may be used as an adjunct in their weight management   Jaslen MAITLAND MUHLBAUER appears to be in the action stage of change and states they are ready to start intensive lifestyle modifications and  behavioral modifications.  It was recommended that she follow up in the next 1-2 weeks to review the above steps, and to continue with treatment of their chronic disease state of obesity if desired   Attestations:  Reviewed by clinician on day of visit: allergies, medications, problem list, medical history, surgical history, family history, social history, and previous encounter notes pertinent to patient's obesity diagnosis.   I have spent 52 minutes in the care of the patient today including: preparing to see patient (e.g. review and interpretation of tests, old notes ), obtaining and/or reviewing separately obtained history, performing a medically appropriate examination or evaluation, counseling and educating the patient, ordering medications, test or procedures, documenting clinical information in the electronic or other health care record, and  independently interpreting results and communicating results to the patient, family, or caregiver   I, Isabelle Course , acting as a medical scribe for Thomasene Lot, DO., have compiled all relevant documentation for today's office visit on behalf of Thomasene Lot, DO, while in the presence of [employer], DO.  I have reviewed the above documentation for accuracy and completeness, and I agree with the above. Laurie Hodge, D.O.  The 21st Century Cures Act was signed into law in 2016 which includes the topic of electronic health records.  This provides immediate access to information in MyChart.  This includes consultation notes, operative notes, office notes, lab results and pathology reports.  If you have any questions about what you read please let us know at your next visit so we can discuss your concerns and take corrective action if need be.  We are right here with you!

## 2023-05-16 ENCOUNTER — Ambulatory Visit: Payer: BC Managed Care – PPO | Admitting: Emergency Medicine

## 2023-05-16 ENCOUNTER — Encounter: Payer: Self-pay | Admitting: Emergency Medicine

## 2023-05-16 ENCOUNTER — Encounter (INDEPENDENT_AMBULATORY_CARE_PROVIDER_SITE_OTHER): Payer: Self-pay | Admitting: Family Medicine

## 2023-05-16 ENCOUNTER — Ambulatory Visit (INDEPENDENT_AMBULATORY_CARE_PROVIDER_SITE_OTHER): Payer: BC Managed Care – PPO | Admitting: Family Medicine

## 2023-05-16 VITALS — BP 132/78 | HR 67 | Temp 98.3°F | Ht 65.0 in | Wt 268.2 lb

## 2023-05-16 VITALS — BP 140/87 | HR 55 | Temp 98.0°F | Ht 65.0 in | Wt 265.0 lb

## 2023-05-16 DIAGNOSIS — G4733 Obstructive sleep apnea (adult) (pediatric): Secondary | ICD-10-CM

## 2023-05-16 DIAGNOSIS — M25571 Pain in right ankle and joints of right foot: Secondary | ICD-10-CM

## 2023-05-16 DIAGNOSIS — Z6841 Body Mass Index (BMI) 40.0 and over, adult: Secondary | ICD-10-CM

## 2023-05-16 DIAGNOSIS — Z1331 Encounter for screening for depression: Secondary | ICD-10-CM

## 2023-05-16 DIAGNOSIS — M059 Rheumatoid arthritis with rheumatoid factor, unspecified: Secondary | ICD-10-CM

## 2023-05-16 DIAGNOSIS — I10 Essential (primary) hypertension: Secondary | ICD-10-CM

## 2023-05-16 DIAGNOSIS — F39 Unspecified mood [affective] disorder: Secondary | ICD-10-CM | POA: Diagnosis not present

## 2023-05-16 DIAGNOSIS — E785 Hyperlipidemia, unspecified: Secondary | ICD-10-CM | POA: Diagnosis not present

## 2023-05-16 DIAGNOSIS — R7303 Prediabetes: Secondary | ICD-10-CM

## 2023-05-16 DIAGNOSIS — R0602 Shortness of breath: Secondary | ICD-10-CM

## 2023-05-16 DIAGNOSIS — R5383 Other fatigue: Secondary | ICD-10-CM

## 2023-05-16 DIAGNOSIS — K219 Gastro-esophageal reflux disease without esophagitis: Secondary | ICD-10-CM

## 2023-05-16 NOTE — Assessment & Plan Note (Signed)
Benign physical examination.  No tenderness on exam.  Full range of motion. Pain management discussed. Probably related to rheumatoid arthritis Continue meloxicam 15 mg daily

## 2023-05-16 NOTE — Progress Notes (Signed)
[name], D.O.  ABFM, ABOM Specializing in Clinical Bariatric Medicine Office located at: [address] [place], [place]  [address]     Bariatric Medicine Visit  Dear Laurie Hodge [facility], *   Thank you for referring Laurie Hodge to our clinic today for evaluation.  We performed a consultation to discuss her options for treatment and educate the patient on her disease state.  The following note includes my evaluation and treatment recommendations.   Please do not hesitate to reach out to me directly if you have any further concerns.    Assessment and Plan:   FOR THE DISEASE OF OBESITY: Morbid obesity (HCC)-starting bmi 05/16/23-44.1 BMI 40.0-44.9, adult (HCC) Since last office visit on 04/17/2023 patient's  Muscle mass has increased by 2.8lb. Fat mass has decreased by 6.2lb. Total body water has decreased by 4.8lb.  Counseling done on how various foods will affect these numbers and how to maximize success  Total lbs lost to date: 3 Total weight loss percentage to date: 1.12%   Recommended Dietary Goals [name] is currently in the action stage of change. As such, her goal is to continue weight management plan. She has agreed to: follow the Category 2 plan - 1200 kcal per day and continue current plan with 6oz at lunch   Behavioral Intervention We discussed the following Behavioral Modification Strategies today: increasing lean protein intake to established goals, increasing vegetables, decreasing eating out or consumption of processed foods, and making healthy choices when eating convenient foods, avoiding temptations and identifying enticing environmental cues, continue to practice mindfulness when eating, better snacking choices, and focusing on food with a 10:1 ratio of calories: grams of protein  Additional resources provided today: Handout on traveling and holiday eating strategies and Thanksgiving strategies  Evidence-based interventions for health behavior  change were utilized today including the discussion of self monitoring techniques, problem-solving barriers and SMART goal setting techniques.   Regarding patient's less desirable eating habits and patterns, we employed the technique of small changes.   Pt will specifically work on: Beginning the meal plan for next visit.   Recommended Physical Activity Goals [name] has been advised to work up to 150 minutes of moderate intensity aerobic activity a week and strengthening exercises 2-3 times per week for cardiovascular health, weight loss maintenance and preservation of muscle mass.   She has agreed to :  Think about enjoyable ways to increase daily physical activity and overcoming barriers to exercise and Increase physical activity in their day and reduce sedentary time (increase NEAT).   Pharmacotherapy We discussed various medication options to help Saki with her weight loss efforts and we both agreed to : continue with nutritional and behavioral strategies   FOR ASSOCIATED CONDITIONS ADDRESSED TODAY: Fatigue Assessment:  [name] does feel that her weight is causing her energy to be lower than it should be. Fatigue may be related to obesity, depression or many other causes. she does not appear to have any red flag symptoms and this appears to most likely be related to her current lifestyle habits and dietary intake.  Plan:  Labs will be ordered and reviewed with her at their next office visit in two weeks. Other fatigue -     EKG 12-Lead -     CBC with Differential/Platelet -     VITAMIN D 25 Hydroxy (Vit-D Deficiency, Fractures) -     Comprehensive metabolic panel -     Folate -     Hemoglobin A1c -  Insulin, random -     T4, free -     TSH -     Vitamin B12  Epworth sleepiness scale score appears to be within normal limits.  Her ESS score is 13.   Aayat admits to daytime somnolence and admits to waking up still tired. Patient has a history of symptoms of morning fatigue.  [name] generally gets 5 hours of sleep per night, and states that she has difficulty sleeping. Snoring is present. Apneic episodes are present.   ECG: Performed and reviewed/ interpreted independently.  sinus bradycardia, rate 58bpm; reassuring without any acute abnormalities, will continue to monitor for symptoms  Other fatigue -     EKG 12-Lead  Modified PHQ-9 Depression Screen: Her Food and Mood (modified PHQ-9) score was 23.   Shortness of breath on exertion Assessment:  [name] does feel that she gets out of breath more easily than she used to when she exercises and seems to be worsening over time with weight gain.  This has gotten worse recently. [name] denies shortness of breath at rest or orthopnea. [name]'s shortness of breath appears to be obesity related and exercise induced, as they do not appear to have any "red flag" symptoms/ concerns today.  Also, this condition appears to be related to a state of poor cardiovascular conditioning   Plan:  Obtain labs today and will be reviewed with her at their next office visit in two weeks.  Indirect Calorimeter completed today to help guide our dietary regimen. It shows a VO2 of 282 and a REE of 1944.  Her calculated basal metabolic rate is 0981 thus her measured basal metabolic rate is better than expected.  Patient agreed to work on weight loss at this time.  As Lovene progresses through our weight loss program, we will gradually increase exercise as tolerated to treat her current condition.   If [name] follows our recommendations and loses 5-10% of their weight without improvement of her shortness of breath or if at any time, symptoms become more concerning, they agree to urgently follow up with their PCP/ specialist for further consideration/ evaluation.   [name] verbalizes agreement with this plan.    Prediabetes Assessment:  Condition is Not at goal.. Pt has a hx of prediabetes that was diagnosed in July, 2024 with a elevated A1c  of  6.6. This is diet/exercise controlled at this moment. Pt endorses having both excessive hunger and cravings and is ready to start her healthy weight loss journey to improve her diagnosis.  Lab Results  Component Value Date   HGBA1C 6.6 (H) 12/19/2022   HGBA1C 6.5 02/08/2022   HGBA1C 6.0 12/29/2020    Plan:  Marykay will continue to work on weight loss, exercise, via their meal plan we devised to help decrease the risk of progressing to diabetes. I explained role of simple carbs and insulin levels on hunger and cravings. Anticipatory guidance given.   We will recheck A1c and fasting insulin level today.  Prediabetes -     Hemoglobin A1c -     Insulin, random   Primary hypertension Assessment:  Condition is Not at goal.. Pt BP is elevated today at 140/87. She informed me that she does monitor her BP at home and this averages 130/80-85. This is being treated with Norvasc and Diovan-HCT which she tolerates without difficulty. Pt is asymptomatic at this time.  Last 3 blood pressure readings in our office are as follows: BP Readings from Last 3 Encounters:  05/16/23 132/78  05/16/23 Marland Kitchen)  140/87  04/17/23 (!) 140/85   Plan:  Continue with Norvasc 5mg  and Diovan-HCT 160-12.5mg  as directed. Counseled Marliss Coots on pathophysiology of disease and discussed treatment plan, which always includes dietary and lifestyle modification as first line. Avoid buying foods that are: processed, frozen, or prepackaged to avoid excess salt. Ambulatory blood pressure monitoring encouraged.  We will continue to monitor closely alongside PCP/ specialists.  Pt reminded to also f/up with those individuals as instructed by them.  Primary hypertension -     Comprehensive metabolic panel -     T4, free -     TSH   Mood disorder (HCC)- emotional eating Assessment:  Condition is Improving, but not optimized.. Denies any SI/HI. Mood is stable currently. She informed me that she was on mood medication yrs ago. Pt  tends to eat when stressed, bored, sad, angry, guilty, for comfort, as an reward, or to help her stay awake. She tends to hide/sneak foods when eats and feels guilt when over-eating as she she feels as she "knows better". She has been judged in the past for her weight and eating choices. She often over eats and has trouble with portion control. She feels as her weight decreases her interest in things she use to enjoy, makes her feels depressed/hopeless, effects her mood and sleep, and increases her fatigue. She feels as her inability to lose weight makes her feel like a failure or like she let herself down.   Plan:  Discussed how thoughts affect eating habits, modeling of thoughts, feelings, and behaviors, and strategies for change.  Importance of not skipping meals and getting all her proteins and fiber in on a daily basis discussed and also discussed cognitive distortions, coping thoughts, and how to change our thoughts/ self talk regarding foods/ eating patterns. No SI/ HI.  Mood stable currently. Reminded patient of the importance of following their prudent nutrition plan and how food can affect mood as well to support emotional wellbeing.  Mood disorder (HCC)- emotional eating -     CBC with Differential/Platelet -     VITAMIN D 25 Hydroxy (Vit-D Deficiency, Fractures) -     Folate -     T4, free -     TSH -     Vitamin B12   OSA (obstructive sleep apnea) Assessment:  Condition is Not optimized.. Pt informed me that she already has an upcoming appt with her primary care provider to further evaluate her sleep apnea and discuss potential solutions.    Hyperlipidemia, unspecified hyperlipidemia type Assessment & Plan:  Condition is Not at goal.. Pt informed me that she has a hx of HLD. This is being treated with Lipitor 40mg  once daily. Marliss Coots agrees to continue with meds and/or our treatment plan of a heart-heathy, low cholesterol meal plan. We extensively discussed several lifestyle  modifications today and she will continue to work on diet, exercise and weight loss efforts. We will continue routine screening as patient continues to achieve health goals along their weight loss journey.  Lab Results  Component Value Date   CHOL 175 12/19/2022   HDL 63.90 12/19/2022   LDLCALC 94 12/19/2022   TRIG 87.0 12/19/2022   CHOLHDL 3 12/19/2022   Hyperlipidemia, unspecified hyperlipidemia type -     Comprehensive metabolic panel -     Lipid Panel With LDL/HDL Ratio   FOLLOW UP:   Follow up in 2 weeks. She was informed of the importance of frequent follow up visits to maximize her  success with intensive lifestyle modifications for her multiple health conditions.  Chancy CHARLIANNE MELICK is aware that we will review all of her lab results at our next visit.  She is aware that if anything is critical/ life threatening with the results, we will be contacting her via MyChart prior to the office visit to discuss management.     Chief Complaint:   OBESITY AUBRIEGH CELEDON (MR# [id number]) is a 62 y.o. female who presents for evaluation and treatment of obesity and related comorbidities. Current BMI is Body mass index is 44.1 kg/m. Theola ANNALYSA BILLINGSLY has been struggling with her weight for many years and has been unsuccessful in either losing weight, maintaining weight loss, or reaching her healthy weight goal.  QUADIRAH HOLDEN is currently in the action stage of change and ready to dedicate time achieving and maintaining a healthier weight. Johnanna SIMRAH BURDSALL is interested in becoming our patient and working on intensive lifestyle modifications including (but not limited to) diet and exercise for weight loss.  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 Patient is single and has 3 children. She lives with her daughter and son-in-law.  Rainey is ready to start her weight loss journey to decrease muscle and joint pain & the number of medications she takes, stabilize her blood sugar,  enjoy new activities, playing with her kids/grand kids, and enjoy life more overall. She states that she has been overweight most of her life and started gaining excessive weight after high school. Her reasons for gaining weight were poor nutrition, lack of exercise, low energy, depression, and self soothing are all reasons. Her heaviest was 76 and she has tried a Systems analyst, no/low fat no/low sugar diet, and high protein. She feels a as the low/no cab and sugar diet worked best. She does not currently exercise. Pt eats take out and fast food about 4x a wk. She grocery shops and occasionally likes to cook. Her cooking obstacles are it not being convenient or if she is too tired. She typically craves sugary desserts and pasta. She does consider herself a picky eater and does not like veggies. Pt does snack frequently and tends to snack on cakes and sweets. She will wake up in the middle of the night due to hunger and often skips breakfast about 3-4x a wk. She does not drink sodas but does drink juice. She feels as her worst food habit is getting cakes like little Debbie's in bulk. She feels as she struggles with poor foods choices, excessive hunger, and issues with portion control. Her and her family tend to eat take out together. She also frequently eat while doing other things.   Subjective:   This is the patient's first visit at [facility].  The patient's NEW PATIENT PACKET that they filled out prior to today's office visit was reviewed at length and information from that paperwork was included within the following office visit note.    Included in the packet: current and past health history, medications, allergies, ROS, gynecologic history (women only), surgical history, family history, social history, weight history, weight loss surgery history (for those that have had weight loss surgery), nutritional evaluation, mood and food questionnaire along with a depression screening  (PHQ9) on all patients, an Epworth questionnaire, sleep habits questionnaire, patient life and health improvement goals questionnaire. These will all be scanned into the patient's chart under the "media" tab.   Review of Systems: Please refer to  new patient packet scanned into media. Pertinent positives were addressed with patient today.  Reviewed by clinician on day of visit: allergies, medications, problem list, medical history, surgical history, family history, social history, and previous encounter notes.  During the visit, I independently reviewed the patient's EKG, bioimpedance scale results, and indirect calorimeter results. I used this information to tailor a meal plan for the patient that will help JAHZARAH LEFEVER to lose weight and will improve her obesity-related conditions going forward.  I performed a medically necessary appropriate examination and/or evaluation. I discussed the assessment and treatment plan with the patient. The patient was provided an opportunity to ask questions and all were answered. The patient agreed with the plan and demonstrated an understanding of the instructions. Labs were ordered today (unless patient declined them) and will be reviewed with the patient at our next visit unless more critical results need to be addressed immediately. Clinical information was updated and documented in the EMR.    Objective:   PHYSICAL EXAM: Blood pressure (!) 140/87, pulse (!) 55, temperature 98 F (36.7 C), height 5\' 5"  (1.651 m), weight 265 lb (120.2 kg), last menstrual period 12/26/2018, SpO2 98%. Body mass index is 44.1 kg/m.  General: Well Developed, well nourished, and in no acute distress.  HEENT: Normocephalic, atraumatic; EOMI, sclerae are anicteric. Skin: Warm and dry, good turgor Chest:  Normal excursion, shape, no gross ABN Respiratory: No conversational dyspnea; speaking in full sentences NeuroM-Sk:  Normal gross ROM * 4 extremities  Psych: A and O *3, insight  adequate, mood- full   Anthropometric Measurements Height: 5\' 5"  (1.651 m) Weight: 268 lb 3.2 oz (121.7 kg) BMI (Calculated): 44.63 Weight at Last Visit: na Weight Lost Since Last Visit: na Weight Gained Since Last Visit: na Starting Weight: 265lb Total Weight Loss (lbs): 0 lb (0 kg) Peak Weight: 281lb Waist Measurement : 51 inches   Body Composition  Body Fat %: 49.8 % Fat Mass (lbs): 132 lbs Muscle Mass (lbs): 126.4 lbs Total Body Water (lbs): 90.6 lbs Visceral Fat Rating : 17   Other Clinical Data RMR: 1942 Fasting: yes Labs: yes Today's Visit #: 1 Starting Date: 05/16/23 Comments: first visit    DIAGNOSTIC DATA REVIEWED:  BMET    Component Value Date/Time   NA 140 12/19/2022 0941   NA 140 01/08/2019 1446   K 3.7 12/19/2022 0941   CL 102 12/19/2022 0941   CO2 27 12/19/2022 0941   GLUCOSE 118 (H) 12/19/2022 0941   BUN 15 12/19/2022 0941   BUN 9 01/08/2019 1446   CREATININE 0.66 12/19/2022 0941   CREATININE 0.60 02/29/2016 1133   CALCIUM 9.3 12/19/2022 0941   GFRNONAA 102 01/08/2019 1446   GFRNONAA >89 10/02/2013 1400   GFRAA 117 01/08/2019 1446   GFRAA >89 10/02/2013 1400   Lab Results  Component Value Date   HGBA1C 6.6 (H) 12/19/2022   HGBA1C 5.2 10/24/2011   No results found for: "INSULIN" Lab Results  Component Value Date   TSH 3.04 12/29/2020   CBC    Component Value Date/Time   WBC 6.0 12/19/2022 0941   RBC 4.59 12/19/2022 0941   HGB 12.8 12/19/2022 0941   HGB 11.2 01/08/2019 1446   HCT 39.6 12/19/2022 0941   HCT 34.2 01/08/2019 1446   PLT 346.0 12/19/2022 0941   PLT 404 01/08/2019 1446   MCV 86.1 12/19/2022 0941   MCV 85 01/08/2019 1446   MCH 27.9 01/08/2019 1446   MCH 20.7 (L) 02/29/2016 1133  MCHC 32.3 12/19/2022 0941   RDW 14.0 12/19/2022 0941   RDW 13.1 01/08/2019 1446   Iron Studies    Component Value Date/Time   IRON 116 01/22/2013 1534   FERRITIN 9.6 (L) 01/22/2013 1534   IRONPCTSAT 28.1 01/22/2013 1534   Lipid  Panel     Component Value Date/Time   CHOL 175 12/19/2022 0941   CHOL 186 01/08/2019 1446   TRIG 87.0 12/19/2022 0941   HDL 63.90 12/19/2022 0941   HDL 59 01/08/2019 1446   CHOLHDL 3 12/19/2022 0941   VLDL 17.4 12/19/2022 0941   LDLCALC 94 12/19/2022 0941   LDLCALC 104 (H) 01/08/2019 1446   Hepatic Function Panel     Component Value Date/Time   PROT 7.8 12/19/2022 0941   PROT 7.5 01/08/2019 1446   ALBUMIN 4.0 12/19/2022 0941   ALBUMIN 4.2 01/08/2019 1446   AST 18 12/19/2022 0941   ALT 19 12/19/2022 0941   ALKPHOS 79 12/19/2022 0941   BILITOT 0.7 12/19/2022 0941   BILITOT 0.6 01/08/2019 1446      Component Value Date/Time   TSH 3.04 12/29/2020 0911   Nutritional Lab Results  Component Value Date   VD25OH 47.4 12/22/2016   VD25OH 42 02/29/2016   VD25OH 59 07/06/2015    Attestation Statements:   I, Clinical biochemist, acting as a Stage manager for Thomasene Lot, DO., have compiled all relevant documentation for today's office visit on behalf of Thomasene Lot, DO, while in the presence of [employer], DO.  Reviewed by clinician on day of visit: allergies, medications, problem list, medical history, surgical history, family history, social history, and previous encounter notes pertinent to patient's obesity diagnosis. I have spent 60 minutes in the care of the patient today including: preparing to see patient (e.g. review and interpretation of tests, old notes ), obtaining and/or reviewing separately obtained history, performing a medically appropriate examination or evaluation, counseling and educating the patient, ordering medications, test or procedures, documenting clinical information in the electronic or other health care record, and independently interpreting results and communicating results to the patient, family, or caregiver   I have reviewed the above documentation for accuracy and completeness, and I agree with the above. [name], D.O.  The  21st Century Cures Act was signed into law in 2016 which includes the topic of electronic health records.  This provides immediate access to information in MyChart.  This includes consultation notes, operative notes, office notes, lab results and pathology reports.  If you have any questions about what you read please let us know at your next visit so we can discuss your concerns and take corrective action if need be.  We are right here with you.

## 2023-05-16 NOTE — Progress Notes (Signed)
Laurie Hodge 62 y.o.   Chief Complaint  Patient presents with   Sleep Apnea    Patient states the last time she did a sleep study was 2015 and needs new supplies for her machine. She just join the weight wellness and they need her to get her sleep apnea under control    Foot Injury    Patient states she started feeling the pain in her right foot 3 weeks ago. Not sure if its her heel, foot, or knee.     HISTORY OF PRESENT ILLNESS: This is a 62 y.o. female with history of sleep apnea but has not had follow-up in a while.  Presently not using CPAP machine.  Needs referral. Also has history of rheumatoid arthritis.  Complaining of pain to right ankle area which responds to meloxicam No other complaints or medical concerns today.  Foot Injury      Prior to Admission medications   Medication Sig Start Date End Date Taking? Authorizing Provider  amLODipine (NORVASC) 5 MG tablet Take 1 tablet (5 mg total) by mouth daily. 12/19/22  Yes Yazir Koerber, Eilleen Kempf, MD  atorvastatin (LIPITOR) 40 MG tablet Take 1 tablet by mouth once daily 05/25/22  Yes Eshani Maestre, Eilleen Kempf, MD  Biotin 5 MG CAPS Take by mouth.   Yes [provider]  Cyanocobalamin (VITAMIN B-12) 500 MCG SUBL Place 2,500 mcg under the tongue 3 (three) times a week.   Yes [provider]  Fish Oil-Krill Oil (KRILL OIL PLUS) CAPS Take 750 mg by mouth daily.   Yes [provider]  iron polysaccharides (FERREX 150) 150 MG capsule Take 1 capsule by mouth twice daily 04/12/23  Yes Tiffny Gemmer, Eilleen Kempf, MD  meloxicam (MOBIC) 7.5 MG tablet Take 1 tablet (7.5 mg total) by mouth as needed for pain. Patient taking differently: Take 15 mg by mouth as needed for pain. 09/25/17  Yes Porfirio Oar, PA  valsartan-hydrochlorothiazide (DIOVAN-HCT) 160-12.5 MG tablet Take 1 tablet by mouth once daily 04/11/23  Yes Ramla Hase, Eilleen Kempf, MD  VITAMIN D PO Take 50 mcg by mouth daily.   Yes [provider]    Allergies   Allergen Reactions   Adhesive [Tape] Rash    Skin Peels    Patient Active Problem List   Diagnosis Date Noted   Morbid obesity (HCC) 12/19/2022   Tinea versicolor 01/07/2020   Body mass index (BMI) of 40.1-44.9 in adult [facility]) 01/07/2020   Osteoarthritis 02/05/2015   Dyslipidemia 11/06/2013   Rheumatoid arthritis (HCC) 06/10/2013   BMI 36.0-36.9,adult    GERD (gastroesophageal reflux disease)    OSA (obstructive sleep apnea)     Class: Minor   Essential hypertension    Vitamin D insufficiency    Iron (Fe) deficiency anemia     Past Medical History:  Diagnosis Date   Allergy    Anemia    Arthritis    RA   Depression    External hemorrhoids    GERD (gastroesophageal reflux disease)    H. pylori infection    many years ago   Hemorrhoid    Hiatal hernia    HSV-2 infection    IgG   HTN (hypertension)    Hyperlipidemia    Hyperplastic colon polyp    Iron (Fe) deficiency anemia    Obesity, Class III, BMI 40-49.9 (morbid obesity) (HCC)    OSA on CPAP    cpap broke this week   Rheumatoid arthritis (HCC)    Sleep apnea  do not use her cipap   Ulcer    gastric - h. pylori positive   Vitamin D insufficiency     Past Surgical History:  Procedure Laterality Date   LAPAROSCOPIC GASTRIC SLEEVE RESECTION N/A 05/31/2015   Procedure: LAPAROSCOPIC GASTRIC SLEEVE RESECTION;  Surgeon: De Blanch Kinsinger, MD;  Location: [facility];  Service: General;  Laterality: N/A;   TUBAL LIGATION      Social History   Socioeconomic History   Marital status: Divorced    Spouse name: n/a   Number of children: 3   Years of education: 12   Highest education level: Not on file  Occupational History   Occupation: TEACHER'S ASSISTANT    Employer: [employer]    Employer: [employer]  Tobacco Use   Smoking status: Never   Smokeless tobacco: Never  Vaping Use   Vaping status: Never Used  Substance and Sexual Activity   Alcohol use: Yes    Alcohol/week: 0.0  standard drinks of alcohol    Comment: 2-3 times a year at special events   Drug use: No   Sexual activity: Not Currently    Partners: Male    Comment: not active since divorce from husband 2010  Other Topics Concern   Not on file  Social History Narrative   Lives with daughter. Her mother who has dementia and had a stroke lives with her during the week and her siblings take turns staying with their mother on the weekends.      Denies caffeine use    Social Determinants of Corporate investment banker Strain: Not on file  Food Insecurity: Not on file  Transportation Needs: Not on file  Physical Activity: Not on file  Stress: Not on file  Social Connections: Not on file  Intimate Partner Violence: Not on file    Family History  Problem Relation Age of Onset   Diabetes Mother    Hypertension Mother    Dementia Mother    Stroke Mother 29   Hyperlipidemia Mother    Depression Mother    Anxiety disorder Mother    Obesity Mother    Heart disease Father    Hyperlipidemia Father    Hypertension Father    Stroke Father    Depression Father    Sleep apnea Father    Obesity Father    Diabetes Sister    Heart disease Sister    Hyperlipidemia Sister    Hypertension Sister    Heart disease Sister    Hyperlipidemia Sister    Hypertension Sister    Diabetes Brother    Hyperlipidemia Brother    Hypertension Brother    Hypertension Son    Cancer Paternal Aunt        breast ca x 5 maternal aunts   Colon cancer Neg Hx    Esophageal cancer Neg Hx    Rectal cancer Neg Hx    Stomach cancer Neg Hx      Review of Systems  Constitutional: Negative.  Negative for chills and fever.  HENT: Negative.  Negative for congestion and sore throat.   Respiratory: Negative.  Negative for cough and shortness of breath.   Cardiovascular: Negative.  Negative for chest pain and palpitations.  Gastrointestinal:  Negative for abdominal pain, diarrhea, nausea and vomiting.  Genitourinary:  Negative.  Negative for dysuria and hematuria.  Musculoskeletal:  Positive for joint pain.  Skin: Negative.  Negative for rash.  Neurological: Negative.  Negative for dizziness and headaches.  All other systems reviewed and are negative.   Vitals:   05/16/23 1324  BP: 132/78  Pulse: 67  Temp: 98.3 F (36.8 C)  SpO2: 95%    Physical Exam Vitals reviewed.  Constitutional:      Appearance: Normal appearance.  HENT:     Head: Normocephalic.     Mouth/Throat:     Mouth: Mucous membranes are moist.     Pharynx: Oropharynx is clear.  Eyes:     Extraocular Movements: Extraocular movements intact.     Pupils: Pupils are equal, round, and reactive to light.  Cardiovascular:     Rate and Rhythm: Normal rate and regular rhythm.     Pulses: Normal pulses.     Heart sounds: Normal heart sounds.  Pulmonary:     Effort: Pulmonary effort is normal.     Breath sounds: Normal breath sounds.  Musculoskeletal:     Cervical back: No tenderness.     Comments: Right ankle: Full range of motion.  No tenderness or swelling.  No abnormal findings.  Lymphadenopathy:     Cervical: No cervical adenopathy.  Skin:    General: Skin is warm and dry.     Capillary Refill: Capillary refill takes less than 2 seconds.  Neurological:     Mental Status: She is alert and oriented to person, place, and time.  Psychiatric:        Mood and Affect: Mood normal.        Behavior: Behavior normal.      ASSESSMENT & PLAN: A total of 46 minutes was spent with the patient and counseling/coordination of care regarding preparing for this visit, review of most recent office visit notes, review of multiple chronic medical conditions and their management, review of all medications, review of most recent bloodwork results, review of health maintenance items, education on nutrition, prognosis, documentation, and need for follow up. .  Problem List Items Addressed This Visit       Cardiovascular and Mediastinum    Essential hypertension - Primary    Well-controlled hypertension Continue amlodipine 5 mg and valsartan HCTZ 160-12.5 mg daily Cardiovascular risks associated with hypertension discussed Diet and nutrition discussed        Respiratory   OSA (obstructive sleep apnea)    Not presently using CPAP Needs reevaluation.  Referral placed today      Relevant Orders   Ambulatory referral to Sleep Studies     Digestive   GERD (gastroesophageal reflux disease)    Stable and well-controlled        Musculoskeletal and Integument   Rheumatoid arthritis (HCC)    Stable.  Pain management discussed. Presently on Enbrel No recent flareups        Other   Body mass index (BMI) of 40.1-44.9 in adult [facility])    Diet and nutrition discussed Advised to decrease amount of daily carbohydrate intake and daily calories and increase amount of plant-based protein in her diet.      Acute right ankle pain    Benign physical examination.  No tenderness on exam.  Full range of motion. Pain management discussed. Probably related to rheumatoid arthritis Continue meloxicam 15 mg daily      Patient Instructions  Health Maintenance, Female Adopting a healthy lifestyle and getting preventive care are important in promoting health and wellness. Ask your health care provider about: The right schedule for you to have regular tests and exams. Things you can do on your own to prevent diseases and keep yourself  healthy. What should I know about diet, weight, and exercise? Eat a healthy diet  Eat a diet that includes plenty of vegetables, fruits, low-fat dairy products, and lean protein. Do not eat a lot of foods that are high in solid fats, added sugars, or sodium. Maintain a healthy weight Body mass index (BMI) is used to identify weight problems. It estimates body fat based on height and weight. Your health care provider can help determine your BMI and help you achieve or maintain a healthy weight. Get  regular exercise Get regular exercise. This is one of the most important things you can do for your health. Most adults should: Exercise for at least 150 minutes each week. The exercise should increase your heart rate and make you sweat (moderate-intensity exercise). Do strengthening exercises at least twice a week. This is in addition to the moderate-intensity exercise. Spend less time sitting. Even light physical activity can be beneficial. Watch cholesterol and blood lipids Have your blood tested for lipids and cholesterol at 60 years of age, then have this test every 5 years. Have your cholesterol levels checked more often if: Your lipid or cholesterol levels are high. You are older than 60 years of age. You are at high risk for heart disease. What should I know about cancer screening? Depending on your health history and family history, you may need to have cancer screening at various ages. This may include screening for: Breast cancer. Cervical cancer. Colorectal cancer. Skin cancer. Lung cancer. What should I know about heart disease, diabetes, and high blood pressure? Blood pressure and heart disease High blood pressure causes heart disease and increases the risk of stroke. This is more likely to develop in people who have high blood pressure readings or are overweight. Have your blood pressure checked: Every 3-5 years if you are 67-67 years of age. Every year if you are 36 years old or older. Diabetes Have regular diabetes screenings. This checks your fasting blood sugar level. Have the screening done: Once every three years after age 59 if you are at a normal weight and have a low risk for diabetes. More often and at a younger age if you are overweight or have a high risk for diabetes. What should I know about preventing infection? Hepatitis B If you have a higher risk for hepatitis B, you should be screened for this virus. Talk with your health care provider to find out if  you are at risk for hepatitis B infection. Hepatitis C Testing is recommended for: Everyone born from [birth year] through 1963. Anyone with known risk factors for hepatitis C. Sexually transmitted infections (STIs) Get screened for STIs, including gonorrhea and chlamydia, if: You are sexually active and are younger than 60 years of age. You are older than 60 years of age and your health care provider tells you that you are at risk for this type of infection. Your sexual activity has changed since you were last screened, and you are at increased risk for chlamydia or gonorrhea. Ask your health care provider if you are at risk. Ask your health care provider about whether you are at high risk for HIV. Your health care provider may recommend a prescription medicine to help prevent HIV infection. If you choose to take medicine to prevent HIV, you should first get tested for HIV. You should then be tested every 3 months for as long as you are taking the medicine. Pregnancy If you are about to stop having your period (premenopausal) and you  may become pregnant, seek counseling before you get pregnant. Take 400 to 800 micrograms (mcg) of folic acid every day if you become pregnant. Ask for birth control (contraception) if you want to prevent pregnancy. Osteoporosis and menopause Osteoporosis is a disease in which the bones lose minerals and strength with aging. This can result in bone fractures. If you are 23 years old or older, or if you are at risk for osteoporosis and fractures, ask your health care provider if you should: Be screened for bone loss. Take a calcium or vitamin D supplement to lower your risk of fractures. Be given hormone replacement therapy (HRT) to treat symptoms of menopause. Follow these instructions at home: Alcohol use Do not drink alcohol if: Your health care provider tells you not to drink. You are pregnant, may be pregnant, or are planning to become pregnant. If you drink  alcohol: Limit how much you have to: 0-1 drink a day. Know how much alcohol is in your drink. In the [place] one drink equals one 12 oz bottle of beer (355 mL), one 5 oz glass of wine (148 mL), or one 1 oz glass of hard liquor (44 mL). Lifestyle Do not use any products that contain nicotine or tobacco. These products include cigarettes, chewing tobacco, and vaping devices, such as e-cigarettes. If you need help quitting, ask your health care provider. Do not use street drugs. Do not share needles. Ask your health care provider for help if you need support or information about quitting drugs. General instructions Schedule regular health, dental, and eye exams. Stay current with your vaccines. Tell your health care provider if: You often feel depressed. You have ever been abused or do not feel safe at home. Summary Adopting a healthy lifestyle and getting preventive care are important in promoting health and wellness. Follow your health care provider's instructions about healthy diet, exercising, and getting tested or screened for diseases. Follow your health care provider's instructions on monitoring your cholesterol and blood pressure. This information is not intended to replace advice given to you by your health care provider. Make sure you discuss any questions you have with your health care provider. Document Revised: 10/25/2020 Document Reviewed: 10/25/2020 Elsevier Patient Education  2024 Elsevier Inc.     Edwina Barth, MD [facility]

## 2023-05-16 NOTE — Patient Instructions (Signed)

## 2023-05-16 NOTE — Assessment & Plan Note (Signed)
Stable.  Pain management discussed. Presently on Enbrel No recent flareups

## 2023-05-16 NOTE — Assessment & Plan Note (Signed)
Diet and nutrition discussed.  Advised to decrease amount of daily carbohydrate intake and daily calories and increase amount of plant-based protein in her diet.

## 2023-05-16 NOTE — Assessment & Plan Note (Signed)
Stable and well controlled.

## 2023-05-16 NOTE — Assessment & Plan Note (Signed)
Well-controlled hypertension Continue amlodipine 5 mg and valsartan HCTZ 160-12.5 mg daily Cardiovascular risks associated with hypertension discussed Diet and nutrition discussed

## 2023-05-16 NOTE — Assessment & Plan Note (Signed)
Not presently using CPAP Needs reevaluation.  Referral placed today

## 2023-05-17 LAB — VITAMIN D 25 HYDROXY (VIT D DEFICIENCY, FRACTURES): Vit D, 25-Hydroxy: 35.2 ng/mL (ref 30.0–100.0)

## 2023-05-17 LAB — CBC WITH DIFFERENTIAL/PLATELET
Basophils Absolute: 0.1 10*3/uL (ref 0.0–0.2)
Basos: 1 %
EOS (ABSOLUTE): 0.1 10*3/uL (ref 0.0–0.4)
Eos: 3 %
Hematocrit: 41.8 % (ref 34.0–46.6)
Hemoglobin: 13.3 g/dL (ref 11.1–15.9)
Immature Grans (Abs): 0 10*3/uL (ref 0.0–0.1)
Immature Granulocytes: 0 %
Lymphocytes Absolute: 1.6 10*3/uL (ref 0.7–3.1)
Lymphs: 37 %
MCH: 28 pg (ref 26.6–33.0)
MCHC: 31.8 g/dL (ref 31.5–35.7)
MCV: 88 fL (ref 79–97)
Monocytes Absolute: 0.5 10*3/uL (ref 0.1–0.9)
Monocytes: 10 %
Neutrophils Absolute: 2.2 10*3/uL (ref 1.4–7.0)
Neutrophils: 49 %
Platelets: 320 10*3/uL (ref 150–450)
RBC: 4.75 x10E6/uL (ref 3.77–5.28)
RDW: 12.9 % (ref 11.7–15.4)
WBC: 4.5 10*3/uL (ref 3.4–10.8)

## 2023-05-17 LAB — T4, FREE: Free T4: 1.06 ng/dL (ref 0.82–1.77)

## 2023-05-17 LAB — COMPREHENSIVE METABOLIC PANEL
ALT: 35 [IU]/L — ABNORMAL HIGH (ref 0–32)
AST: 26 [IU]/L (ref 0–40)
Albumin: 4.5 g/dL (ref 3.8–4.9)
Alkaline Phosphatase: 101 [IU]/L (ref 44–121)
BUN/Creatinine Ratio: 12 (ref 12–28)
BUN: 9 mg/dL (ref 8–27)
Bilirubin Total: 0.9 mg/dL (ref 0.0–1.2)
CO2: 24 mmol/L (ref 20–29)
Calcium: 9.4 mg/dL (ref 8.7–10.3)
Chloride: 102 mmol/L (ref 96–106)
Creatinine, Ser: 0.73 mg/dL (ref 0.57–1.00)
Globulin, Total: 3 g/dL (ref 1.5–4.5)
Glucose: 123 mg/dL — ABNORMAL HIGH (ref 70–99)
Potassium: 4.2 mmol/L (ref 3.5–5.2)
Sodium: 142 mmol/L (ref 134–144)
Total Protein: 7.5 g/dL (ref 6.0–8.5)
eGFR: 94 mL/min/{1.73_m2} (ref >59)

## 2023-05-17 LAB — LIPID PANEL WITH LDL/HDL RATIO
Cholesterol, Total: 175 mg/dL (ref 100–199)
HDL: 49 mg/dL (ref >39)
LDL Chol Calc (NIH): 104 mg/dL — ABNORMAL HIGH (ref 0–99)
LDL/HDL Ratio: 2.1 {ratio} (ref 0.0–3.2)
Triglycerides: 123 mg/dL (ref 0–149)
VLDL Cholesterol Cal: 22 mg/dL (ref 5–40)

## 2023-05-17 LAB — VITAMIN B12: Vitamin B-12: >2000 pg/mL — ABNORMAL HIGH (ref 232–1245)

## 2023-05-17 LAB — INSULIN, RANDOM: INSULIN: 19.4 u[IU]/mL (ref 2.6–24.9)

## 2023-05-17 LAB — TSH: TSH: 1.48 u[IU]/mL (ref 0.450–4.500)

## 2023-05-17 LAB — HEMOGLOBIN A1C
Est. average glucose Bld gHb Est-mCnc: 140 mg/dL
Hgb A1c MFr Bld: 6.5 % — ABNORMAL HIGH (ref 4.8–5.6)

## 2023-05-17 LAB — FOLATE: Folate: 8.7 ng/mL (ref >3.0)

## 2023-05-30 ENCOUNTER — Ambulatory Visit (INDEPENDENT_AMBULATORY_CARE_PROVIDER_SITE_OTHER): Payer: BC Managed Care – PPO | Admitting: Family Medicine

## 2023-05-30 ENCOUNTER — Encounter (INDEPENDENT_AMBULATORY_CARE_PROVIDER_SITE_OTHER): Payer: Self-pay | Admitting: Family Medicine

## 2023-05-30 DIAGNOSIS — E782 Mixed hyperlipidemia: Secondary | ICD-10-CM | POA: Diagnosis not present

## 2023-05-30 DIAGNOSIS — E1159 Type 2 diabetes mellitus with other circulatory complications: Secondary | ICD-10-CM | POA: Diagnosis not present

## 2023-05-30 DIAGNOSIS — Z6841 Body Mass Index (BMI) 40.0 and over, adult: Secondary | ICD-10-CM

## 2023-05-30 DIAGNOSIS — E66813 Obesity, class 3: Secondary | ICD-10-CM

## 2023-05-30 DIAGNOSIS — I152 Hypertension secondary to endocrine disorders: Secondary | ICD-10-CM | POA: Diagnosis not present

## 2023-05-30 DIAGNOSIS — E669 Obesity, unspecified: Secondary | ICD-10-CM

## 2023-05-30 DIAGNOSIS — R7989 Other specified abnormal findings of blood chemistry: Secondary | ICD-10-CM

## 2023-05-30 DIAGNOSIS — F5089 Other specified eating disorder: Secondary | ICD-10-CM

## 2023-05-30 DIAGNOSIS — E1169 Type 2 diabetes mellitus with other specified complication: Secondary | ICD-10-CM | POA: Diagnosis not present

## 2023-05-30 DIAGNOSIS — G4733 Obstructive sleep apnea (adult) (pediatric): Secondary | ICD-10-CM

## 2023-05-30 DIAGNOSIS — E559 Vitamin D deficiency, unspecified: Secondary | ICD-10-CM

## 2023-05-30 DIAGNOSIS — F39 Unspecified mood [affective] disorder: Secondary | ICD-10-CM

## 2023-05-30 MED ORDER — VITAMIN D 50 MCG (2000 UT) PO TABS: ORAL_TABLET | ORAL | Status: AC

## 2023-05-30 NOTE — Progress Notes (Incomplete)
Laurie Hodge, D.O.  ABFM, ABOM Clinical Bariatric Medicine Physician  Office located at: [address] [place], [place]  [address]     Assessment and Plan:   FOR THE DISEASE OF OBESITY: BMI 40.0-44.9, adult (HCC) Obesity, current BMI 44.2 Since last office visit on 05/16/23 patient's  Muscle mass has decreased by 3.8lb. Fat mass has increased by 4.2lb. Total body water has increased by 4.4lb.  Counseling done on how various foods will affect these numbers and how to maximize success  Total lbs lost to date: 0 lbs Total weight loss percentage to date: 0%   Recommended Dietary Goals Laurie Hodge is currently in the action stage of change. As such, her goal is to continue weight management plan. She has agreed to: follow the Category 2 meal plan with lunch options  Pt reports difficulty staying on plan due to limited time to prepare meals. She has been celebrating her daughter's wedding and hosting relatives during this time. Reviewed meal prep/planning strategies and encouraged pt to find ways to make time to cook these meals. Avoid skipping meals. I advise she start to measure cooked protein and vegetable portions.   Behavioral Intervention We discussed the following Behavioral Modification Strategies today: increasing lean protein intake to established goals, avoiding skipping meals, increasing water intake , work on meal planning and preparation, practice mindfulness eating and understand the difference between hunger signals and cravings, and celebration eating strategies  Additional resources provided today: Handout on FDA approved anti-obesity medications and adverse effects, Handout on traveling and holiday eating strategies, and Handout on CAT 1-2 lunch options  Evidence-based interventions for health behavior change were utilized today including the discussion of self monitoring techniques, problem-solving barriers and SMART goal setting techniques.   Regarding patient's  less desirable eating habits and patterns, we employed the technique of small changes.   Pt will specifically work on: Following her meal plan for next visit.   Recommended Physical Activity Goals Laurie Hodge has been advised to work up to 150 minutes of moderate intensity aerobic activity a week and strengthening exercises 2-3 times per week for cardiovascular health, weight loss maintenance and preservation of muscle mass.   She has agreed to :  Increase physical activity in their day and reduce sedentary time (increase NEAT).   Pharmacotherapy We discussed various medication options to help Laurie Hodge with her weight loss efforts and we both agreed to : continue with nutritional and behavioral strategies and will revisit option of starting Metformin after she has consistently been eating on-plan.    FOR ASSOCIATED CONDITIONS ADDRESSED TODAY: Type 2 diabetes mellitus with morbid obesity (HCC) Assessment & Plan: Lab Results  Component Value Date   HGBA1C 6.5 (H) 05/16/2023   HGBA1C 6.6 (H) 12/19/2022   HGBA1C 6.5 02/08/2022   INSULIN 19.4 05/16/2023    New onset of DM, pt reports this is the first she has been told of this diagnosis despite having diabetes for over a year per A1C labs. Last A1C of 6.5 is consistent with diabetes diagnosis.   Reviewed ideal A1C and insulin levels with patient. We discussed the benefits of Metformin such as better control with hunger/cravings. Reviewed possible side effects of not eating on plan while taking this medication recommended she consistently follow her meal plan prior to starting Metformin. Encouraged pt to make mindful eating habits, avoid skipping meals, and follow her prudent nutritional meal plan in an effort to lose weight and improve the management of her current condition. Discussed benefits  of increasing protein intake in an effort to stabilize sugars.  Extensive education on diabetes was provided to patient and I highly recommend she review the  American Diabetes Association website for more information. Follow up with her PCP for diabetic foot exam and eye exam.    Hypertension associated with diabetes (HCC) BP Readings from Last 3 Encounters:  05/30/23 138/77  05/16/23 132/78  05/16/23 (!) 140/87   Pt is on amlodipine 5 mg once daily and valsartan-hydrochlorothiazide 160-12.5 mg once daily. Monitoring BP at home, readings range from 120-130s/70s.   Reviewed ideal BP readings of 130/80 or less. Continue on current antihypertensive regimen per PCP/specialist and follow up with these individuals as instructed by them. I encouraged her to follow healthy eating habits and increase exercise to help improve her condition.    Mixed diabetic hyperlipidemia associated with type 2 diabetes mellitus (HCC) Lab Results  Component Value Date   CHOL 175 05/16/2023   HDL 49 05/16/2023   LDLCALC 104 (H) 05/16/2023   TRIG 123 05/16/2023   CHOLHDL 3 12/19/2022   LDL above goal at 104. Laurie Hodge is currently compliant with Lipitor 40 mg once daily. Tolerating well with no reported side effects.   Reviewed ideal goal of LDL of below 70s given her diabetic status. Discuss with PCP possibility of increasing her dose of Lipitor now that she is diagnosed with diabetes. Continue with current regimen until able to follow up with PCP.    OSA (obstructive sleep apnea) Assessment & Plan: Per our recommendation at her last visit, she has followed up with her PCP, Laurie Hodge, and was referred for a sleepy study. Currently waiting to hear back from sleep study to schedule a date. We will continue to monitor her condition as it relates to weight loss. I recommend she continue to follow up with PCP as instructed by them.    Vitamin D deficiency Assessment & Plan: Lab Results  Component Value Date   VD25OH 35.2 05/16/2023   VD25OH 47.4 12/22/2016   VD25OH 42 02/29/2016   Vitamin D levels have been consistently below goal. She has previously been  informed of her vitamin D deficiency and has been on vitamin D supplementation for about 10 years or more. Pt reports taking about 2000 IUs daily, though she is uncertain of exact dosing. Laurie Hodge has not yet had a bone density scan.   Reviewed ideal levels of 50+, especially in wintertime. Reviewed benefits of sufficient levels of vitamin D including increased energy, improved immunity, and weight loss.  After discussion, we mutually agreed to increase daily vitamin D supplementation as instructed. We will continue to monitor her condition alongside PCP.    Orders: -     Vitamin D; Pt will increase to 4000 one day and 6,000 the next. (  change to 5,000 IU daily)   Elevated LFTs Assessment & Plan: Lab Results  Component Value Date   ALT 35 (H) 05/16/2023   AST 26 05/16/2023   ALKPHOS 101 05/16/2023   BILITOT 0.9 05/16/2023    Pt visceral rating is above goal at 18. Pt reports she has not been told of elevated liver enzymes prior to today's visit. Alcohol consumption is very occasional, about 2 times a year. Education pt on most common causes of elevated LFTs and methods to improve her levels. Reviewed ideal visceral fat rating of 12. Discussed importance of decreasing visceral fat rating by losing 5-7% of body fat.    Mood disorder (HCC)- emotional eating Assessment & Plan:  Moods overall stable. Denies SI/HI. Pt denies depression but reports she "doesn't feel like doing things" due to her weight. She doesn't want people to see her and notes this is more due to body insecurity, not depression. She endorses emotional eating.   I recommend she limit celebration eating to 1 day rather than eating off-plan for multiple days or a week. Discussed the importance of prioritizing her health and general wellbeing with healthy efforts of stress management. We discussed options of seeing Dr. Dewaine Conger, our Bariatric Psychologist, for evaluation of struggles with emotional eating. Reminded patient of the  importance of following their prudent nutrition plan and how food can affect mood as well to support emotional wellbeing. We will continue to monitor closely alongside PCP / other specialists.  FOLLOW UP:   Return in about 2 weeks (around 06/13/2023). She was informed of the importance of frequent follow up visits to maximize her success with intensive lifestyle modifications for her multiple health conditions.  Subjective:   Chief complaint: Obesity Laurie Hodge is here to discuss her progress with her obesity treatment plan. She is on the Category 2 Plan and states she is following her eating plan approximately 0% of the time. She states she is not exercising.  Interval History:  Laurie Hodge is here today for her first follow-up office visit since starting the program with [place]. Since last office visit she has not been following her meal plan due to her daughter getting married with the wedding celebration lasted 3 days. She was in charge of hosting relatives from out of town at her house and ate out more often.   All blood work/ lab tests that were recently ordered by myself or an outside provider were reviewed with patient today per their request. Extended time was spent counseling her on all new disease processes that were discovered or preexisting ones that are affected by BMI.  she understands that many of these abnormalities will need to monitored regularly along with the current treatment plan of prudent dietary changes, in which we are making each and every office visit, to improve these health parameters.  Pharmacotherapy for weight loss: She is not currently taking medications  for medical weight loss.  Denies side effects.    Review of Systems:  Pertinent positives were addressed with patient today.  Reviewed by clinician on day of visit: allergies, medications, problem list, medical history, surgical history, family history, social history, and previous encounter notes.   Weight  Summary and Biometrics   Weight Lost Since Last Visit: 0  Weight Gained Since Last Visit: 0   Vitals Temp: (!) 97.5 F (36.4 C) BP: 138/77 Pulse Rate: (!) 58 SpO2: 100 %   Anthropometric Measurements Height: 5\' 5"  (1.651 m) Weight: 265 lb (120.2 kg) BMI (Calculated): 44.1 Weight at Last Visit: 265 lb Weight Lost Since Last Visit: 0 Weight Gained Since Last Visit: 0 Starting Weight: 265 LB Total Weight Loss (lbs): 0 lb (0 kg) Peak Weight: 281 lb Waist Measurement : 51 inches   Body Composition  Body Fat %: 51.3 % Fat Mass (lbs): 136.2 lbs Muscle Mass (lbs): 122.6 lbs Total Body Water (lbs): 95 lbs Visceral Fat Rating : 18   Other Clinical Data RMR: [id number] Fasting: NO Labs: NO Today's Visit #: 2 Starting Date: 05/16/23   Objective:   PHYSICAL EXAM:  Blood pressure 138/77, pulse (!) 58, temperature (!) 97.5 F (36.4 C), height 5\' 5"  (1.651 m), weight 265 lb (120.2 kg), last menstrual period 12/26/2018,  SpO2 100%. Body mass index is 44.1 kg/m.  General: she is overweight, cooperative and in no acute distress.   HEENT: EOMI, sclerae are anicteric. Lungs: Normal breathing effort, no conversational dyspnea. M-Sk:  Normal gross ROM * 4 extremities  PSYCH: Has normal mood, affect and thought process. Neurologic: No gross sensory or motor deficits. Well developed, A and O * 3  DIAGNOSTIC DATA REVIEWED:  BMET    Component Value Date/Time   NA 142 05/16/2023 0851   K 4.2 05/16/2023 0851   CL 102 05/16/2023 0851   CO2 24 05/16/2023 0851   GLUCOSE 123 (H) 05/16/2023 0851   GLUCOSE 118 (H) 12/19/2022 0941   BUN 9 05/16/2023 0851   CREATININE 0.73 05/16/2023 0851   CREATININE 0.60 02/29/2016 1133   CALCIUM 9.4 05/16/2023 0851   GFRNONAA 102 01/08/2019 1446   GFRNONAA >89 10/02/2013 1400   GFRAA 117 01/08/2019 1446   GFRAA >89 10/02/2013 1400   Lab Results  Component Value Date   HGBA1C 6.5 (H) 05/16/2023   HGBA1C 5.2 10/24/2011   Lab Results   Component Value Date   INSULIN 19.4 05/16/2023   Lab Results  Component Value Date   TSH 1.480 05/16/2023   CBC    Component Value Date/Time   WBC 4.5 05/16/2023 0851   WBC 6.0 12/19/2022 0941   RBC 4.75 05/16/2023 0851   RBC 4.59 12/19/2022 0941   HGB 13.3 05/16/2023 0851   HCT 41.8 05/16/2023 0851   PLT 320 05/16/2023 0851   MCV 88 05/16/2023 0851   MCH 28.0 05/16/2023 0851   MCH 20.7 (L) 02/29/2016 1133   MCHC 31.8 05/16/2023 0851   MCHC 32.3 12/19/2022 0941   RDW 12.9 05/16/2023 0851   Iron Studies    Component Value Date/Time   IRON 116 01/22/2013 1534   FERRITIN 9.6 (L) 01/22/2013 1534   IRONPCTSAT 28.1 01/22/2013 1534   Lipid Panel     Component Value Date/Time   CHOL 175 05/16/2023 0851   TRIG 123 05/16/2023 0851   HDL 49 05/16/2023 0851   CHOLHDL 3 12/19/2022 0941   VLDL 17.4 12/19/2022 0941   LDLCALC 104 (H) 05/16/2023 0851   Hepatic Function Panel     Component Value Date/Time   PROT 7.5 05/16/2023 0851   ALBUMIN 4.5 05/16/2023 0851   AST 26 05/16/2023 0851   ALT 35 (H) 05/16/2023 0851   ALKPHOS 101 05/16/2023 0851   BILITOT 0.9 05/16/2023 0851      Component Value Date/Time   TSH 1.480 05/16/2023 0851   Nutritional Lab Results  Component Value Date   VD25OH 35.2 05/16/2023   VD25OH 47.4 12/22/2016   VD25OH 42 02/29/2016    Attestations:   Reviewed by clinician on day of visit: allergies, medications, problem list, medical history, surgical history, family history, social history, and previous encounter notes pertinent to patient's obesity diagnosis.   I have spent 48 minutes in the care of the patient today including: preparing to see patient (e.g. review and interpretation of tests, old notes ), obtaining and/or reviewing separately obtained history, performing a medically appropriate examination or evaluation, counseling and educating the patient, ordering medications, test or procedures, documenting clinical information in the  electronic or other health care record, and independently interpreting results and communicating results to the patient, family, or caregiver   I, Isabelle Course, acting as a medical scribe for Thomasene Lot, DO., have compiled all relevant documentation for today's office visit on behalf of Thomasene Lot, DO, while  in the presence of Thomasene Lot, DO.  I have reviewed the above documentation for accuracy and completeness, and I agree with the above. Laurie Hodge, D.O.  The 21st Century Cures Act was signed into law in 2016 which includes the topic of electronic health records.  This provides immediate access to information in MyChart.  This includes consultation notes, operative notes, office notes, lab results and pathology reports.  If you have any questions about what you read please let us know at your next visit so we can discuss your concerns and take corrective action if need be.  We are right here with you.

## 2023-06-14 LAB — HM MAMMOGRAPHY

## 2023-06-15 ENCOUNTER — Encounter: Payer: Self-pay | Admitting: Emergency Medicine

## 2023-06-27 ENCOUNTER — Ambulatory Visit (INDEPENDENT_AMBULATORY_CARE_PROVIDER_SITE_OTHER): Payer: Self-pay | Admitting: Physician Assistant

## 2023-07-04 ENCOUNTER — Ambulatory Visit: Payer: 59 | Admitting: Neurology

## 2023-07-04 ENCOUNTER — Encounter: Payer: Self-pay | Admitting: Neurology

## 2023-07-04 ENCOUNTER — Telehealth: Payer: Self-pay | Admitting: *Deleted

## 2023-07-04 ENCOUNTER — Ambulatory Visit (INDEPENDENT_AMBULATORY_CARE_PROVIDER_SITE_OTHER): Payer: BC Managed Care – PPO | Admitting: Family Medicine

## 2023-07-04 VITALS — BP 135/80 | HR 56 | Ht 65.5 in | Wt 260.0 lb

## 2023-07-04 DIAGNOSIS — Z9884 Bariatric surgery status: Secondary | ICD-10-CM | POA: Diagnosis not present

## 2023-07-04 DIAGNOSIS — R001 Bradycardia, unspecified: Secondary | ICD-10-CM

## 2023-07-04 DIAGNOSIS — G4733 Obstructive sleep apnea (adult) (pediatric): Secondary | ICD-10-CM

## 2023-07-04 DIAGNOSIS — R351 Nocturia: Secondary | ICD-10-CM

## 2023-07-04 DIAGNOSIS — R635 Abnormal weight gain: Secondary | ICD-10-CM | POA: Diagnosis not present

## 2023-07-04 NOTE — Progress Notes (Signed)
 Subjective:    Patient ID: Laurie Hodge is a 63 y.o. female.  HPI    Laurie Fairy, MD, PhD [facility] [address] [address] [place], [place] [address]  Dear Dr. Vedia Geralds,  I saw your patient, Laurie Hodge, upon your kind request in my sleep clinic today for initial consultation of her sleep disorder, in particular, evaluation of her prior diagnosis of obstructive sleep apnea.  The patient is unaccompanied today.  As you know, Laurie Hodge is a 63 year old female with an underlying medical history of rheumatoid arthritis, hypertension, hyperlipidemia, vitamin D  deficiency, reflux disease, allergies, anemia, depression, and severe obesity with a BMI of over 40, status post gastric sleeve in 2016, who was diagnosed with obstructive sleep apnea several years ago.  She was previously followed in our sleep clinic but has not been seen in over 7 years.  I was able to review her PAP compliance data from 2017, she was compliant with her CPAP of 11 cm at the time.  She reports some weight fluctuation and interim increase in her weight.  She reports that her sister has recently commented on her heavy snoring and gurgling sound as well as breathing pauses while asleep.  Her Epworth sleepiness score is 12, fatigue severity score is 63 out of 63.  She is divorced and lives alone, she has 3 grown children, ages 2, 53, and 69.  She has no pets in the household.  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  She goes to bed around 11 and rise time is around 5:45 AM.  She has nocturia about once per average night.  She denies recurrent nocturnal morning headaches.  She previously benefited from PAP therapy and would be willing to consider it again.  She brought her previous CPAP machine which is a ResMed air sense 10 AutoSet machine with a set pressure of 11 cm with EPR of 1.   I reviewed your office note from 05/16/2023. She had a baseline sleep study through  our office on 01/16/2016 for reevaluation of her obstructive sleep apnea as she had lost weight in the context of her weight loss surgery.  At that time her AHI was 9.6/h, O2 nadir 93%.  She did have significant PLM's at the time with mild associated arousals.  Her weight was 196 lb at the time, current weight at 260 lb.  She follows with medical weight management.    She drinks no daily caffeine.  She is a non-smoker.  She drinks alcohol rarely.  Previously:  01/03/2016: 63 year old right-handed woman with an underlying medical history of rheumatoid arthritis, obesity, reflux disease, hypertension, vitamin D  deficiency as well as iron  deficiency anemia and history of HSV-2 infection, who presents for followup consultation of her sleep apnea, treated with CPAP therapy. She is unaccompanied today and presents after a long gap. Of note, she had interim weight loss surgery and has lost a significant amount of weight. She canceled multiple appointments in the interim, including 04/15/2015, 10/14/2015, 11/30/2015. I last saw her on 04/14/2014, at which time she reported doing well overall. She needed new supplies. She had switched from methotrexate to Humira  for her rheumatoid arthritis.  I reviewed her CPAP compliance data from 12/04/2015 through 01/02/2016 which is a total of 30 days during which time she used her machine only 13 days. It looks like she restarted treatment on 12/19/2015. Average usage for all days only 2 hours and 50 minutes, average usage for  days on machine was 6 hours and 33 minutes. Residual AHI 2.2 per hour, leak at times high with the 95th percentile at 38.5 L/m on a pressure of 11 cm with EPR of 1. (She) reports that she had stopped using her CPAP because she felt she did not need it any longer. However, recently, her daughter reported to her that she was still snoring and that she was not breathing normally during sleep. She restarted using her CPAP for that reason. In the interim, she  also had problems with her machine and the power cord had to be replaced. Her DME company is no longer in business. She does note the air leaking from the mask. She is using a nose mask. She had laparoscopic gastric sleeve resection on 05/31/15. She has been exercising. She has lost over 80 pounds since her weight loss surgery. Her split-night sleep study from February 2015 had shown severe obstructive sleep apnea and she had been compliant with CPAP of 11 cm with good results. We reviewed her sleep study results from 07/29/2013 briefly again today. Total AHI was 45.2 per hour, oxyhemoglobin desaturation nadir was 82%, CPAP pressure of 11 cm via medium nasal mask reduced her AHI to 0 per hour, oxyhemoglobin desaturation nadir was 89%, she had mild PLMS with no arousals before and after CPAP treatment.     I saw her on 10/13/2013, at which time she reported sleeping much better, EDS was much better, but she still had some fatigue and lately more SOBOE with some leg swelling noted. She had been placed on low dose HCTZ and pravachol  and was going to see cardiology. She had an echocardiogram on 10/22/2013 which was reported as normal.   I reviewed her compliance data from 03/15/2014 through 04/13/2014 which is a total of 30 days during which time she used her machine every night except for 1 night. Percent used days greater than 4 hours was 90%, indicating excellent compliance with an average usage of 7 hours. Pressure at 11 cm with EPR 1. Residual AHI at 2.3 per hour indicating an adequate pressure setting. Leak was high at times. 95th percentile of leak was 39.8 L/m which is too high.   I first met her on 07/14/2013, at which time she reported that her CPAP machine had broken. She original sleep study was from 2009. I asked her to return for reevaluation and treatment. She had a split-night sleep study on 07/29/2013. Her baseline sleep efficiency was 63.1% with a latency to sleep of 60 minutes and wake after  sleep onset of 18.5 minutes with mild sleep fragmentation noted. She had a normal arousal index. She had an increased percentage of stage I sleep, an increased percentage of slow-wave sleep and a reduced percentage of REM sleep with a prolonged REM latency. She had mild periodic leg movements of sleep with no arousals associated. She had mild to moderate snoring. Her total AHI was high at 45.2 per hour. Baseline oxygen saturation was 94% with a nadir of 82%. She was then titrated on CPAP, pressure of 5-11 cm. On the final pressure her AHI was 0 per hour. Supine REM sleep was achieved. She slept 92.2% of the time during the second part of the study. She had a increased percentage of stage II sleep, near absence of slow-wave sleep, and a normal percentage of REM sleep. Average oxygen saturation was 95% with a nadir of 89%. Snoring was eliminated. Based on the test results I prescribed CPAP for her.  I reviewed her compliance data from 08/25/2013 through 09/15/2013 which is a total of 22 days during which time she uses CPAP every night. Average usage for all days was 7 hours and 21 minutes, percent used days greater than 4 hours was 100%, indicating superb compliance. Residual AHI was acceptable at 1.9 per hour and leak was acceptable at 17.4 L per minute at the 95th percentile.   I reviewed her compliance data from 09/02/2013 through 10/12/2013 which is the last 41 days during which time she was CPAP every night. Percent used days greater than 4 hours was 98%, showing excellent compliance. Average usage was 7 hours and 56 minutes, residual AHI was 1.8 per hour, 95th percentile of leak was 26.4 L per minute, slightly high.   I reviewed her sleep study interpretation report which was performed at the [facility] on 09/17/2007: Her sleep efficiency was 68.6%, REM latency was 247 minutes. Her overall AHI was 7.23 per hour, rising to 28.09 per hour in REM sleep. Her lowest oxygen saturation was  92%. She was found to snore loudly. She was 255 lb at the time of her previous sleep study.   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 She does not smoke and drinks alcohol rarely.   She drinks an energy drink as needed, about once a week and is not much of a caffeine drinker.    Her Past Medical History Is Significant For: Past Medical History:  Diagnosis Date   Allergy    Anemia    Arthritis    RA   Depression    External hemorrhoids    GERD (gastroesophageal reflux disease)    H. pylori infection    many years ago   Hemorrhoid    Hiatal hernia    HSV-2 infection    IgG   HTN (hypertension)    Hyperlipidemia    Hyperplastic colon polyp    Iron  (Fe) deficiency anemia    Obesity, Class III, BMI 40-49.9 (morbid obesity) (HCC)    OSA on CPAP    cpap broke this week   Rheumatoid arthritis (HCC)    Sleep apnea    do not use her cipap   Ulcer    gastric - h. pylori positive   Vitamin D  insufficiency     Her Past Surgical History Is Significant For: Past Surgical History:  Procedure Laterality Date   LAPAROSCOPIC GASTRIC SLEEVE RESECTION N/A 05/31/2015   Procedure: LAPAROSCOPIC GASTRIC SLEEVE RESECTION;  Surgeon: Derral Flick, MD;  Location: [facility];  Service: General;  Laterality: N/A;   TUBAL LIGATION      Her Family History Is Significant For: Family History  Problem Relation Age of Onset   Diabetes Mother    Hypertension Mother    Dementia Mother    Stroke Mother 68   Hyperlipidemia Mother    Depression Mother    Anxiety disorder Mother    Obesity Mother    Heart disease Father    Hyperlipidemia Father    Hypertension Father    Stroke Father    Depression Father    Sleep apnea Father    Obesity Father    Diabetes Sister    Heart disease Sister    Hyperlipidemia Sister    Hypertension Sister    Heart disease Sister    Hyperlipidemia Sister    Hypertension Sister    Diabetes Brother    Hyperlipidemia Brother     Hypertension Brother  Hypertension Son    Cancer Paternal Aunt        breast ca x 5 maternal aunts   Colon cancer Neg Hx    Esophageal cancer Neg Hx    Rectal cancer Neg Hx    Stomach cancer Neg Hx     Her Social History Is Significant For: Social History   Socioeconomic History   Marital status: Divorced    Spouse name: n/a   Number of children: 3   Years of education: 12   Highest education level: Not on file  Occupational History   Occupation: TEACHER'S ASSISTANT    Employer: [employer]    Employer: [employer]  Tobacco Use   Smoking status: Never   Smokeless tobacco: Never  Vaping Use   Vaping status: Never Used  Substance and Sexual Activity   Alcohol use: Yes    Alcohol/week: 0.0 standard drinks of alcohol    Comment: 2-3 times a year at special events   Drug use: No   Sexual activity: Not Currently    Partners: Male    Comment: not active since divorce from husband 2010  Other Topics Concern   Not on file  Social History Narrative   Lives alone     Caffeine: hot chocolate a couple times a week   Social Drivers of Corporate investment banker Strain: Not on file  Food Insecurity: Not on file  Transportation Needs: Not on file  Physical Activity: Not on file  Stress: Not on file  Social Connections: Not on file    Her Allergies Are:  Allergies  Allergen Reactions   Adhesive [Tape] Rash    Skin Peels  :   Her Current Medications Are:  Outpatient Encounter Medications as of 07/04/2023  Medication Sig   amLODipine  (NORVASC ) 5 MG tablet Take 1 tablet (5 mg total) by mouth daily.   atorvastatin  (LIPITOR) 40 MG tablet Take 1 tablet by mouth once daily   Biotin 5 MG CAPS Take by mouth.   Cholecalciferol (VITAMIN D ) 50 MCG (2000 UT) tablet Pt will increase to 4000 one day and 6,000 the next. (  change to 5,000 IU daily)   Cyanocobalamin (VITAMIN B-12) 500 MCG SUBL Place 2,500 mcg under the tongue 3 (three) times a week.   Fish  Oil-Krill Oil (KRILL OIL PLUS) CAPS Take 750 mg by mouth daily.   iron  polysaccharides (FERREX 150) 150 MG capsule Take 1 capsule by mouth twice daily   meloxicam  (MOBIC ) 7.5 MG tablet Take 1 tablet (7.5 mg total) by mouth as needed for pain. (Patient taking differently: Take 15 mg by mouth as needed for pain.)   valsartan -hydrochlorothiazide  (DIOVAN -HCT) 160-12.5 MG tablet Take 1 tablet by mouth once daily   No facility-administered encounter medications on file as of 07/04/2023.  :   Review of Systems:  Out of a complete 14 point review of systems, all are reviewed and negative with the exception of these symptoms as listed below:  Review of Systems  Neurological:        Patient is here alone to get re-established as a patient for sleep apnea. She stopped using her cpap almost 8 years ago. She states recently her family started telling her that she is snoring and stops breathing. She also wants to lose weight and her provider for weight loss told her she needs to treat her sleep apnea in order to be successful.     Objective:  Neurological Exam  Physical Exam Physical  Examination:   Vitals:   07/04/23 1356  BP: 135/80  Pulse: (!) 56    General Examination: The patient is a very pleasant 63 y.o. female in no acute distress. She appears well-developed and well-nourished and well groomed.   HEENT: Normocephalic, atraumatic, pupils are equal, round and reactive to light, extraocular tracking is good without limitation to gaze excursion or nystagmus noted. Hearing is grossly intact. Face is symmetric with normal facial animation. Speech is clear with no dysarthria noted. There is no hypophonia. There is no lip, neck/head, jaw or voice tremor. Neck is supple with full range of passive and active motion. There are no carotid bruits on auscultation. Oropharynx exam reveals: mild mouth dryness, adequate dental hygiene and moderate airway crowding, due to redundant soft palate and larger  tongue.  Mallampati is class II-III.  Tonsillar size about 1+.  Tongue protrudes centrally and palate elevates symmetrically, neck circumference 17-1/2 inches.  She has some malocclusion, as such very little overbite.  Chest: Clear to auscultation without wheezing, rhonchi or crackles noted.  Heart: S1+S2+0, regular but bradycardia noted.    Abdomen: Soft, non-tender and non-distended.  Extremities: There is nonpitting puffiness around the feet and ankles.    Skin: Warm and dry without trophic changes noted.   Musculoskeletal: exam reveals no obvious joint deformities.   Neurologically:  Mental status: The patient is awake, alert and oriented in all 4 spheres. Her immediate and remote memory, attention, language skills and fund of knowledge are appropriate. There is no evidence of aphasia, agnosia, apraxia or anomia. Speech is clear with normal prosody and enunciation. Thought process is linear. Mood is normal and affect is normal.  Cranial nerves II - XII are as described above under HEENT exam.  Motor exam: Normal bulk, strength and tone is noted. There is no obvious action or resting tremor.  Fine motor skills and coordination: grossly intact.  Cerebellar testing: No dysmetria or intention tremor. There is no truncal or gait ataxia.  Sensory exam: intact to light touch in the upper and lower extremities.  Gait, station and balance: She stands easily. No veering to one side is noted. No leaning to one side is noted. Posture is age-appropriate and stance is narrow based. Gait shows normal stride length and normal pace. No problems turning are noted.   Assessment and Plan:  In summary, PAYSLI STANFILL is a very pleasant 63 y.o.-year old female with an underlying medical history of rheumatoid arthritis, hypertension, hyperlipidemia, vitamin D  deficiency, reflux disease, allergies, anemia, depression, and severe obesity with a BMI of over 40, status post gastric sleeve in 2016, who presents for  reevaluation of her obstructive sleep apnea.  She is currently not on PAP therapy but was on positive airway pressure treatment in the past.  She has had significant weight fluctuation.  She is willing to get reevaluated.  While a laboratory attended sleep study is typically considered "gold standard" for evaluation of sleep disordered breathing, we mutually agreed to proceed with a home sleep test at this time.   I had a long chat with the patient about my findings and the diagnosis of sleep apnea, particularly OSA, its prognosis and treatment options. We talked about medical/conservative treatments, surgical interventions and non-pharmacological approaches for symptom control. I explained, in particular, the risks and ramifications of untreated moderate to severe OSA, especially with respect to developing cardiovascular disease down the road, including congestive heart failure (CHF), difficult to treat hypertension, cardiac arrhythmias (particularly A-fib), neurovascular complications including  TIA, stroke and dementia. Even type 2 diabetes has, in part, been linked to untreated OSA. Symptoms of untreated OSA may include (but may not be limited to) daytime sleepiness, nocturia (i.e. frequent nighttime urination), memory problems, mood irritability and suboptimally controlled or worsening mood disorder such as depression and/or anxiety, lack of energy, lack of motivation, physical discomfort, as well as recurrent headaches, especially morning or nocturnal headaches. We talked about the importance of maintaining a healthy lifestyle and striving for healthy weight.  I recommended a sleep study at this time. I outlined the differences between a laboratory attended sleep study which is considered more comprehensive and accurate over the option of a home sleep test (HST); the latter may lead to underestimation of sleep disordered breathing in some instances and does not help with diagnosing upper airway resistance  syndrome and is not accurate enough to diagnose primary central sleep apnea typically. I outlined possible surgical and non-surgical treatment options of OSA, including the use of a positive airway pressure (PAP) device (i.e. CPAP, AutoPAP/APAP or BiPAP in certain circumstances), a custom-made dental device (aka oral appliance, which would require a referral to a specialist dentist or orthodontist typically, and is generally speaking not considered for patients with full dentures or edentulous state), upper airway surgical options, such as traditional UPPP (which is not considered a first-line treatment) or the Inspire device (hypoglossal nerve stimulator, which would involve a referral for consultation with an ENT surgeon, after careful selection, following inclusion criteria - also not first-line treatment). I explained the PAP treatment option to the patient in detail, as this is generally considered first-line treatment.  The patient indicated that she would be willing to try PAP therapy again, if the need arises. I explained the importance of being compliant with PAP treatment, not only for insurance purposes but primarily to improve patient's symptoms symptoms, and for the patient's long term health benefit, including to reduce Her cardiovascular risks longer-term.    We will pick up our discussion about the next steps and treatment options after testing.  We will keep her posted as to the test results by phone call and/or MyChart messaging where possible.  We will plan to follow-up in sleep clinic accordingly as well.  I answered all her questions today and the patient was in agreement.   I encouraged her to call with any interim questions, concerns, problems or updates or email us  through MyChart.  Generally speaking, sleep test authorizations may take up to 2 weeks, sometimes less, sometimes longer, the patient is encouraged to get in touch with us  if they do not hear back from the sleep lab staff  directly within the next 2 weeks.  Thank you very much for allowing me to participate in the care of this nice patient. If I can be of any further assistance to you please do not hesitate to call me at [phone].  Sincerely,   Laurie Fairy, MD, PhD

## 2023-07-04 NOTE — Telephone Encounter (Signed)
 LMVM for pt to bring her cpap and power cord with her to appt for us  to get latest information (even though not using at this time).

## 2023-07-04 NOTE — Patient Instructions (Signed)
It was nice to see you again today!   Here is what we discussed today:    Based on your symptoms and your exam I believe you are still at risk for obstructive sleep apnea (aka OSA). As discussed, we will proceed with a home sleep test to re-assess your sleep apnea and how severe it is. Even, if you have mild OSA, I may want you to consider treatment with CPAP, as treatment of even borderline or mild sleep apnea can result and improvement of symptoms such as sleep disruption, daytime sleepiness, nighttime bathroom breaks, restless leg symptoms, improvement of headache syndromes, even improved mood disorder.   As explained, an attended sleep study (meaning you get to stay overnight in the sleep lab), lets [place] monitor sleep-related behaviors such as sleep talking and leg movements in sleep, in addition to monitoring for sleep apnea.  A home sleep test is a screening tool for sleep apnea diagnosis only, but unfortunately, does not help with any other sleep-related diagnoses.  Please remember, the long-term risks and ramifications of untreated moderate to severe obstructive sleep apnea may include (but are not limited to): increased risk for cardiovascular disease, including congestive heart failure, stroke, difficult to control hypertension, treatment resistant obesity, arrhythmias, especially irregular heartbeat commonly known as A. Fib. (atrial fibrillation); even type 2 diabetes has been linked to untreated OSA.   Other correlations that untreated obstructive sleep apnea include macular edema which is swelling of the retina in the eyes, droopy eyelid syndrome, and elevated hemoglobin and hematocrit levels (often referred to as polycythemia).  Sleep apnea can cause disruption of sleep and sleep deprivation in most cases, which, in turn, can cause recurrent headaches, problems with memory, mood, concentration, focus, and vigilance. Most people with untreated sleep apnea report excessive daytime sleepiness,  which can affect their ability to drive. Please do not drive or use heavy equipment or machinery, if you feel sleepy! Patients with sleep apnea can also develop difficulty initiating and maintaining sleep (aka insomnia).   Having sleep apnea may increase your risk for other sleep disorders, including involuntary behaviors sleep such as sleep terrors, sleep talking, sleepwalking.    Having sleep apnea can also increase your risk for restless leg syndrome and leg movements at night.   Please note that untreated obstructive sleep apnea may carry additional perioperative morbidity. Patients with significant obstructive sleep apnea (typically, in the moderate to severe degree) should receive, if possible, perioperative PAP (positive airway pressure) therapy and the surgeons and particularly the anesthesiologists should be informed of the diagnosis and the severity of the sleep disordered breathing.   We will call you or email you through MyChart with regards to your test results and plan a follow-up in sleep clinic accordingly. Most likely, you will hear from one of our nurses.   Our sleep lab administrative assistant will call you to schedule your sleep study and give you further instructions, regarding the check in process for the sleep study, arrival time, what to bring, when you can expect to leave after the study, etc., and to answer any other logistical questions you may have. If you don't hear back from her by about 2 weeks from now, please feel free to call her direct line at [phone] or you can call our general clinic number, or email [place] through My Chart.

## 2023-07-16 ENCOUNTER — Other Ambulatory Visit: Payer: Self-pay | Admitting: Emergency Medicine

## 2023-07-16 DIAGNOSIS — E785 Hyperlipidemia, unspecified: Secondary | ICD-10-CM

## 2023-07-16 DIAGNOSIS — D500 Iron deficiency anemia secondary to blood loss (chronic): Secondary | ICD-10-CM

## 2023-07-19 ENCOUNTER — Ambulatory Visit: Payer: 59 | Admitting: Neurology

## 2023-07-19 DIAGNOSIS — G4733 Obstructive sleep apnea (adult) (pediatric): Secondary | ICD-10-CM | POA: Diagnosis not present

## 2023-07-19 DIAGNOSIS — R635 Abnormal weight gain: Secondary | ICD-10-CM

## 2023-07-19 DIAGNOSIS — R351 Nocturia: Secondary | ICD-10-CM

## 2023-07-19 DIAGNOSIS — Z9884 Bariatric surgery status: Secondary | ICD-10-CM

## 2023-07-19 DIAGNOSIS — R001 Bradycardia, unspecified: Secondary | ICD-10-CM

## 2023-07-31 NOTE — Progress Notes (Unsigned)
Laurie Hodge

## 2023-08-01 ENCOUNTER — Ambulatory Visit (INDEPENDENT_AMBULATORY_CARE_PROVIDER_SITE_OTHER): Payer: 59

## 2023-08-01 ENCOUNTER — Encounter: Payer: Self-pay | Admitting: Emergency Medicine

## 2023-08-01 ENCOUNTER — Ambulatory Visit: Payer: 59 | Admitting: Emergency Medicine

## 2023-08-01 VITALS — BP 134/90 | HR 59 | Temp 98.3°F | Ht 65.5 in | Wt 254.0 lb

## 2023-08-01 DIAGNOSIS — J988 Other specified respiratory disorders: Secondary | ICD-10-CM | POA: Diagnosis not present

## 2023-08-01 DIAGNOSIS — E785 Hyperlipidemia, unspecified: Secondary | ICD-10-CM

## 2023-08-01 DIAGNOSIS — B9789 Other viral agents as the cause of diseases classified elsewhere: Secondary | ICD-10-CM | POA: Diagnosis not present

## 2023-08-01 DIAGNOSIS — Z6841 Body Mass Index (BMI) 40.0 and over, adult: Secondary | ICD-10-CM

## 2023-08-01 DIAGNOSIS — R5383 Other fatigue: Secondary | ICD-10-CM

## 2023-08-01 DIAGNOSIS — I10 Essential (primary) hypertension: Secondary | ICD-10-CM

## 2023-08-01 LAB — COMPREHENSIVE METABOLIC PANEL
ALT: 29 U/L (ref 0–35)
AST: 21 U/L (ref 0–37)
Albumin: 4.4 g/dL (ref 3.5–5.2)
Alkaline Phosphatase: 72 U/L (ref 39–117)
BUN: 17 mg/dL (ref 6–23)
CO2: 32 meq/L (ref 19–32)
Calcium: 9.6 mg/dL (ref 8.4–10.5)
Chloride: 102 meq/L (ref 96–112)
Creatinine, Ser: 0.61 mg/dL (ref 0.40–1.20)
GFR: 97.06 mL/min (ref >60.00)
Glucose, Bld: 86 mg/dL (ref 70–99)
Potassium: 3.7 meq/L (ref 3.5–5.1)
Sodium: 141 meq/L (ref 135–145)
Total Bilirubin: 0.9 mg/dL (ref 0.2–1.2)
Total Protein: 8.2 g/dL (ref 6.0–8.3)

## 2023-08-01 LAB — LIPID PANEL
Cholesterol: 165 mg/dL (ref 0–200)
HDL: 53.8 mg/dL (ref >39.00)
LDL Cholesterol: 89 mg/dL (ref 0–99)
NonHDL: 111.28
Total CHOL/HDL Ratio: 3
Triglycerides: 112 mg/dL (ref 0.0–149.0)
VLDL: 22.4 mg/dL (ref 0.0–40.0)

## 2023-08-01 LAB — CBC WITH DIFFERENTIAL/PLATELET
Basophils Absolute: 0 10*3/uL (ref 0.0–0.1)
Basophils Relative: 0.9 % (ref 0.0–3.0)
Eosinophils Absolute: 0.1 10*3/uL (ref 0.0–0.7)
Eosinophils Relative: 2.4 % (ref 0.0–5.0)
HCT: 41.8 % (ref 36.0–46.0)
Hemoglobin: 13.6 g/dL (ref 12.0–15.0)
Lymphocytes Relative: 38.7 % (ref 12.0–46.0)
Lymphs Abs: 2.1 10*3/uL (ref 0.7–4.0)
MCHC: 32.6 g/dL (ref 30.0–36.0)
MCV: 87 fL (ref 78.0–100.0)
Monocytes Absolute: 0.6 10*3/uL (ref 0.1–1.0)
Monocytes Relative: 11.1 % (ref 3.0–12.0)
Neutro Abs: 2.5 10*3/uL (ref 1.4–7.7)
Neutrophils Relative %: 46.9 % (ref 43.0–77.0)
Platelets: 337 10*3/uL (ref 150.0–400.0)
RBC: 4.8 Mil/uL (ref 3.87–5.11)
RDW: 13.9 % (ref 11.5–15.5)
WBC: 5.4 10*3/uL (ref 4.0–10.5)

## 2023-08-01 LAB — TSH: TSH: 1.64 u[IU]/mL (ref 0.35–5.50)

## 2023-08-01 MED ORDER — KETOCONAZOLE 2 % EX CREA: 1.0000 | TOPICAL_CREAM | Freq: Every day | CUTANEOUS | 2 refills | Status: AC

## 2023-08-01 NOTE — Progress Notes (Signed)
Laurie Hodge 63 y.o.   Chief Complaint  Patient presents with   Cough    Patient has persistent cough/ SOB for about 3 weeks. Fatigue, no fever, no sore throats, some back pain.     HISTORY OF PRESENT ILLNESS: This is a 63 y.o. 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 No other complaints or medical concerns today. BP Readings from Last 3 Encounters:  07/04/23 135/80  05/30/23 138/77  05/16/23 132/78     Cough Pertinent negatives include no chest pain, chills, fever, headaches, hemoptysis, rash, sore throat or shortness of breath.     Prior to Admission medications   Medication Sig Start Date End Date Taking? Authorizing Provider  amLODipine (NORVASC) 5 MG tablet Take 1 tablet (5 mg total) by mouth daily. 12/19/22  Yes Oziah Vitanza, Eilleen Kempf, MD  atorvastatin (LIPITOR) 40 MG tablet Take 1 tablet by mouth once daily 07/17/23  Yes Ido Wollman, Eilleen Kempf, MD  Biotin 5 MG CAPS Take by mouth.   Yes [provider]  Cholecalciferol (VITAMIN D) 50 MCG (2000 UT) tablet Pt will increase to 4000 one day and 6,000 the next. (  change to 5,000 IU daily) 05/30/23  Yes Opalski, Gavin Pound, DO  Cyanocobalamin (VITAMIN B-12) 500 MCG SUBL Place 2,500 mcg under the tongue 3 (three) times a week.   Yes [provider]  ENBREL SURECLICK 50 MG/ML injection Inject 50 mg into the skin once a week. 06/22/23  Yes [provider]  FERREX 150 150 MG capsule Take 1 capsule by mouth twice daily 07/17/23  Yes Quina Wilbourne, Rush [place], MD  Fish Oil-Krill Oil (KRILL OIL PLUS) CAPS Take 750 mg by mouth daily.   Yes [provider]  meloxicam (MOBIC) 7.5 MG tablet Take 1 tablet (7.5 mg total) by mouth as needed for pain. 09/25/17  Yes Porfirio Oar,  PA  valsartan-hydrochlorothiazide (DIOVAN-HCT) 160-12.5 MG tablet Take 1 tablet by mouth once daily 04/11/23  Yes Betzy Barbier, Eilleen Kempf, MD    Allergies  Allergen Reactions   Adhesive [Tape] Rash    Skin Peels    Patient Active Problem List   Diagnosis Date Noted   Acute right ankle pain 05/16/2023   Morbid obesity (HCC) 12/19/2022   Tinea versicolor 01/07/2020   Body mass index (BMI) of 40.1-44.9 in adult [facility]) 01/07/2020   Osteoarthritis 02/05/2015   Dyslipidemia 11/06/2013   Rheumatoid arthritis (HCC) 06/10/2013   BMI 36.0-36.9,adult    GERD (gastroesophageal reflux disease)    OSA (obstructive sleep apnea)     Class: Minor   Essential hypertension    Vitamin D insufficiency    Iron (Fe) deficiency anemia     Past Medical History:  Diagnosis Date   Allergy    Anemia    Arthritis    RA   Depression    External hemorrhoids    GERD (gastroesophageal reflux disease)    H. pylori infection    many years ago   Hemorrhoid    Hiatal hernia    HSV-2 infection    IgG   HTN (hypertension)    Hyperlipidemia    Hyperplastic colon polyp    Iron (Fe) deficiency anemia    Obesity, Class III, BMI 40-49.9 (morbid obesity) (HCC)    OSA on CPAP  cpap broke this week   Rheumatoid arthritis (HCC)    Sleep apnea    do not use her cipap   Ulcer    gastric - h. pylori positive   Vitamin D insufficiency     Past Surgical History:  Procedure Laterality Date   LAPAROSCOPIC GASTRIC SLEEVE RESECTION N/A 05/31/2015   Procedure: LAPAROSCOPIC GASTRIC SLEEVE RESECTION;  Surgeon: De Blanch Kinsinger, MD;  Location: [facility];  Service: General;  Laterality: N/A;   TUBAL LIGATION      Social History   Socioeconomic History   Marital status: Divorced    Spouse name: n/a   Number of children: 3   Years of education: 12   Highest education level: Not on file  Occupational History   Occupation: TEACHER'S ASSISTANT    Employer: [employer]    Employer: [employer]  Tobacco Use   Smoking status: Never   Smokeless tobacco: Never  Vaping Use   Vaping status: Never Used  Substance and Sexual Activity   Alcohol use: Yes    Alcohol/week: 0.0 standard drinks of alcohol    Comment: 2-3 times a year at special events   Drug use: No   Sexual activity: Not Currently    Partners: Male    Comment: not active since divorce from husband 2010  Other Topics Concern   Not on file  Social History Narrative   Lives alone     Caffeine: hot chocolate a couple times a week   Social Drivers of Corporate investment banker Strain: Not on file  Food Insecurity: Not on file  Transportation Needs: Not on file  Physical Activity: Not on file  Stress: Not on file  Social Connections: Not on file  Intimate Partner Violence: Not on file    Family History  Problem Relation Age of Onset   Diabetes Mother    Hypertension Mother    Dementia Mother    Stroke Mother 33   Hyperlipidemia Mother    Depression Mother    Anxiety disorder Mother    Obesity Mother    Heart disease Father    Hyperlipidemia Father    Hypertension Father    Stroke Father    Depression Father    Sleep apnea Father    Obesity Father    Diabetes Sister    Heart disease Sister    Hyperlipidemia Sister    Hypertension Sister    Heart disease Sister    Hyperlipidemia Sister    Hypertension Sister    Diabetes Brother    Hyperlipidemia Brother    Hypertension Brother    Hypertension Son    Cancer Paternal Aunt        breast ca x 5 maternal aunts   Colon cancer Neg Hx    Esophageal cancer Neg Hx    Rectal cancer Neg Hx    Stomach cancer Neg Hx      Review of Systems  Constitutional:  Positive for malaise/fatigue. Negative for chills and fever.  HENT: Negative.  Negative for congestion and sore throat.   Respiratory:  Positive for cough. Negative for hemoptysis, sputum production and shortness of breath.   Cardiovascular: Negative.  Negative for chest pain and  palpitations.  Gastrointestinal:  Negative for abdominal pain, diarrhea, nausea and vomiting.  Genitourinary: Negative.  Negative for dysuria and hematuria.  Skin: Negative.  Negative for rash.  Neurological: Negative.  Negative for dizziness and headaches.  All other systems reviewed and are negative.  Today's Vitals   08/01/23 1551  BP: (!) 134/90  Pulse: (!) 59  Temp: 98.3 F (36.8 C)  TempSrc: Oral  SpO2: 99%  Weight: 254 lb (115.2 kg)  Height: 5' 5.5" (1.664 m)   Body mass index is 41.62 kg/m.   Physical Exam Vitals reviewed.  Constitutional:      Appearance: Normal appearance. She is obese.  HENT:     Head: Normocephalic.     Right Ear: Tympanic membrane, ear canal and external ear normal.     Left Ear: Tympanic membrane, ear canal and external ear normal.     Mouth/Throat:     Mouth: Mucous membranes are moist.     Pharynx: Oropharynx is clear.  Eyes:     Extraocular Movements: Extraocular movements intact.     Conjunctiva/sclera: Conjunctivae normal.     Pupils: Pupils are equal, round, and reactive to light.  Cardiovascular:     Rate and Rhythm: Normal rate and regular rhythm.     Pulses: Normal pulses.     Heart sounds: Normal heart sounds.  Pulmonary:     Effort: Pulmonary effort is normal.     Breath sounds: Normal breath sounds.  Musculoskeletal:     Cervical back: No tenderness.  Lymphadenopathy:     Cervical: No cervical adenopathy.  Skin:    General: Skin is warm and dry.     Capillary Refill: Capillary refill takes less than 2 seconds.  Neurological:     General: No focal deficit present.     Mental Status: She is alert and oriented to person, place, and time.  Psychiatric:        Mood and Affect: Mood normal.        Behavior: Behavior normal.      ASSESSMENT & PLAN: A total of 42 minutes was spent with the patient and counseling/coordination of care regarding preparing for this visit, review of most recent office visit notes, review of  multiple chronic medical conditions and their management, review of all medications, review of most recent bloodwork results, review of health maintenance items, education on nutrition, prognosis, documentation, and need for follow up.   Problem List Items Addressed This Visit       Cardiovascular and Mediastinum   Essential hypertension   Well-controlled hypertension Continue amlodipine 5 mg and valsartan HCTZ 160-12.5 mg daily Cardiovascular risks associated with hypertension discussed Diet and nutrition discussed      Relevant Orders   Comprehensive metabolic panel   DG Chest 2 View     Respiratory   Viral respiratory illness - Primary   Clinically stable.  Running its course without complications No signs of pneumonia X-ray done today.  Report reviewed. Better today than a couple of weeks ago Symptom management discussed      Relevant Medications   ketoconazole (NIZORAL) 2 % cream   Other Relevant Orders   Comprehensive metabolic panel   CBC with Differential/Platelet   Lipid panel   TSH   DG Chest 2 View     Other   Dyslipidemia   Cardiovascular risks associated with dyslipidemia discussed Diet and nutrition discussed Lipid profile done today Continue atorvastatin 40 mg daily      Relevant Orders   Comprehensive metabolic panel   Lipid panel   Morbid obesity (HCC)   Diet and nutrition discussed Benefits of exercise discussed Advised to decrease amount of daily carbohydrate intake and daily calories and increase amount of plant based protein in her diet  Other fatigue   Most likely secondary to viral infection Differential diagnosis discussed Recommend blood work and chest x-ray today Advised to rest and stay well-hydrated Clinically stable.  No red flag signs or symptoms.      Relevant Orders   Comprehensive metabolic panel   CBC with Differential/Platelet   Lipid panel   TSH   DG Chest 2 View   Patient Instructions  Fatigue If you have  fatigue, you feel tired all the time and have a lack of energy or a lack of motivation. Fatigue may make it difficult to start or complete tasks because of exhaustion. Occasional or mild fatigue is often a normal response to activity or life. However, long-term (chronic) or extreme fatigue may be a symptom of a medical condition such as: Depression. Not having enough red blood cells or hemoglobin in the blood (anemia). A problem with a small gland located in the lower front part of the neck (thyroid disorder). Rheumatologic conditions. These are problems related to the body's defense system (immune system). Infections, especially certain viral infections. Fatigue can also lead to negative health outcomes over time. Follow these instructions at home: Medicines Take over-the-counter and prescription medicines only as told by your health care provider. Take a multivitamin if told by your health care provider. Do not use herbal or dietary supplements unless they are approved by your health care provider. Eating and drinking  Avoid heavy meals in the evening. Eat a well-balanced diet, which includes lean proteins, whole grains, plenty of fruits and vegetables, and low-fat dairy products. Avoid eating or drinking too many products with caffeine in them. Avoid alcohol. Drink enough fluid to keep your urine pale yellow. Activity  Exercise regularly, as told by your health care provider. Use or practice techniques to help you relax, such as yoga, tai chi, meditation, or massage therapy. Lifestyle Change situations that cause you stress. Try to keep your work and personal schedules in balance. Do not use recreational or illegal drugs. General instructions Monitor your fatigue for any changes. Go to bed and get up at the same time every day. Avoid fatigue by pacing yourself during the day and getting enough sleep at night. Maintain a healthy weight. Contact a health care provider if: Your  fatigue does not get better. You have a fever. You suddenly lose or gain weight. You have headaches. You have trouble falling asleep or sleeping through the night. You feel angry, guilty, anxious, or sad. You have swelling in your legs or another part of your body. Get help right away if: You feel confused, feel like you might faint, or faint. Your vision is blurry or you have a severe headache. You have severe pain in your abdomen, your back, or the area between your waist and hips (pelvis). You have chest pain, shortness of breath, or an irregular or fast heartbeat. You are unable to urinate, or you urinate less than normal. You have abnormal bleeding from the rectum, nose, lungs, nipples, or, if you are female, the vagina. You vomit blood. You have thoughts about hurting yourself or others. These symptoms may be an emergency. Get help right away. Call 911. Do not wait to see if the symptoms will go away. Do not drive yourself to the hospital. Get help right away if you feel like you may hurt yourself or others, or have thoughts about taking your own life. Go to your nearest emergency room or: Call 911. Call the National Suicide Prevention Lifeline at 616-208-2768 or 988.  This is open 24 hours a day. Text the Crisis Text Line at 410-062-8821. Summary If you have fatigue, you feel tired all the time and have a lack of energy or a lack of motivation. Fatigue may make it difficult to start or complete tasks because of exhaustion. Long-term (chronic) or extreme fatigue may be a symptom of a medical condition. Exercise regularly, as told by your health care provider. Change situations that cause you stress. Try to keep your work and personal schedules in balance. This information is not intended to replace advice given to you by your health care provider. Make sure you discuss any questions you have with your health care provider. Document Revised: 03/28/2021 Document Reviewed:  03/28/2021 Elsevier Patient Education  2024 Elsevier Inc.   Edwina Barth, MD [facility]

## 2023-08-01 NOTE — Assessment & Plan Note (Signed)
Well-controlled hypertension Continue amlodipine 5 mg and valsartan HCTZ 160-12.5 mg daily Cardiovascular risks associated with hypertension discussed Diet and nutrition discussed

## 2023-08-01 NOTE — Assessment & Plan Note (Signed)
Cardiovascular risks associated with dyslipidemia discussed Diet and nutrition discussed Lipid profile done today Continue atorvastatin 40 mg daily

## 2023-08-01 NOTE — Assessment & Plan Note (Signed)
Most likely secondary to viral infection Differential diagnosis discussed Recommend blood work and chest x-ray today Advised to rest and stay well-hydrated Clinically stable.  No red flag signs or symptoms.

## 2023-08-01 NOTE — Assessment & Plan Note (Signed)
Clinically stable.  Running its course without complications No signs of pneumonia X-ray done today.  Report reviewed. Better today than a couple of weeks ago Symptom management discussed

## 2023-08-01 NOTE — Assessment & Plan Note (Signed)
Diet and nutrition discussed Benefits of exercise discussed Advised to decrease amount of daily carbohydrate intake and daily calories and increase amount of plant based protein in her diet.

## 2023-08-01 NOTE — Patient Instructions (Signed)
Fatigue If you have fatigue, you feel tired all the time and have a lack of energy or a lack of motivation. Fatigue may make it difficult to start or complete tasks because of exhaustion. Occasional or mild fatigue is often a normal response to activity or life. However, long-term (chronic) or extreme fatigue may be a symptom of a medical condition such as: Depression. Not having enough red blood cells or hemoglobin in the blood (anemia). A problem with a small gland located in the lower front part of the neck (thyroid disorder). Rheumatologic conditions. These are problems related to the body's defense system (immune system). Infections, especially certain viral infections. Fatigue can also lead to negative health outcomes over time. Follow these instructions at home: Medicines Take over-the-counter and prescription medicines only as told by your health care provider. Take a multivitamin if told by your health care provider. Do not use herbal or dietary supplements unless they are approved by your health care provider. Eating and drinking  Avoid heavy meals in the evening. Eat a well-balanced diet, which includes lean proteins, whole grains, plenty of fruits and vegetables, and low-fat dairy products. Avoid eating or drinking too many products with caffeine in them. Avoid alcohol. Drink enough fluid to keep your urine pale yellow. Activity  Exercise regularly, as told by your health care provider. Use or practice techniques to help you relax, such as yoga, tai chi, meditation, or massage therapy. Lifestyle Change situations that cause you stress. Try to keep your work and personal schedules in balance. Do not use recreational or illegal drugs. General instructions Monitor your fatigue for any changes. Go to bed and get up at the same time every day. Avoid fatigue by pacing yourself during the day and getting enough sleep at night. Maintain a healthy weight. Contact a health care  provider if: Your fatigue does not get better. You have a fever. You suddenly lose or gain weight. You have headaches. You have trouble falling asleep or sleeping through the night. You feel angry, guilty, anxious, or sad. You have swelling in your legs or another part of your body. Get help right away if: You feel confused, feel like you might faint, or faint. Your vision is blurry or you have a severe headache. You have severe pain in your abdomen, your back, or the area between your waist and hips (pelvis). You have chest pain, shortness of breath, or an irregular or fast heartbeat. You are unable to urinate, or you urinate less than normal. You have abnormal bleeding from the rectum, nose, lungs, nipples, or, if you are female, the vagina. You vomit blood. You have thoughts about hurting yourself or others. These symptoms may be an emergency. Get help right away. Call 911. Do not wait to see if the symptoms will go away. Do not drive yourself to the hospital. Get help right away if you feel like you may hurt yourself or others, or have thoughts about taking your own life. Go to your nearest emergency room or: Call 911. Call the National Suicide Prevention Lifeline at (262)721-8699 or 988. This is open 24 hours a day. Text the Crisis Text Line at 8450584327. Summary If you have fatigue, you feel tired all the time and have a lack of energy or a lack of motivation. Fatigue may make it difficult to start or complete tasks because of exhaustion. Long-term (chronic) or extreme fatigue may be a symptom of a medical condition. Exercise regularly, as told by your health care provider.  Change situations that cause you stress. Try to keep your work and personal schedules in balance. This information is not intended to replace advice given to you by your health care provider. Make sure you discuss any questions you have with your health care provider. Document Revised: 03/28/2021 Document  Reviewed: 03/28/2021 Elsevier Patient Education  2024 [employer].

## 2023-08-02 NOTE — Procedures (Signed)
[facility]  HOME SLEEP TEST (SANSA) REPORT (Mail-Out Device):   STUDY DATE: 07/21/2023  DOB: [birth year]  MRN: [id number]  ORDERING CLINICIAN: Huston Foley, MD, PhD   REFERRING CLINICIAN: Georgina Quint, MD   CLINICAL INFORMATION/HISTORY: 63 year old female with an underlying medical history of rheumatoid arthritis, hypertension, hyperlipidemia, vitamin D deficiency, reflux disease, allergies, anemia, depression, and severe obesity with a BMI of over 40, status post gastric sleeve in 2016, who was diagnosed with obstructive sleep apnea several years ago.  She is currently not using a PAP machine.  She reports snoring and excessive daytime somnolence as well as breathing pauses while asleep.    PATIENT'S LAST REPORTED EPWORTH SLEEPINESS SCORE (ESS): 12/24.  BMI (at the time of sleep clinic visit and/or test date): 42.6 kg/m  FINDINGS:   Study Protocol:    The SANSA single-point-of-skin-contact chest-worn sensor - an FDA cleared and DOT approved type 4 home sleep test device - measures eight physiological channels,  including blood oxygen saturation (measured via PPG [photoplethysmography]), EKG-derived heart rate, respiratory effort, chest movement (measured via accelerometer), snoring, body position, and actigraphy. The device is designed to be worn for up to 10 hours per study.   Sleep Summary:   Total Recording Time (hours, min): 8 hours, 58 min  Total Effective Sleep Time (hours, min):  7 hours, 50 min  Sleep Efficiency (%):    89%   Respiratory Indices:   Calculated sAHI (per hour):  18.9/hour         Oxygen Saturation Statistics:    Oxygen Saturation (%) Mean: 94.1%   Minimum oxygen saturation (%):                 77%   O2 Saturation Range (%): 77- 100%  Time below or at 88% saturation: 6 minutes    Pulse Rate Statistics:   Pulse Mean (bpm):    55/min    Pulse Range (45 - 89/min)   Snoring: Moderate to loud  IMPRESSION/DIAGNOSES:    OSA (obstructive sleep apnea), moderate   RECOMMENDATIONS:   This home sleep test demonstrates moderate obstructive sleep apnea with a total AHI of 18.9/hour and O2 nadir of 77%.  Moderate to loud snoring was detected. Treatment with a positive airway pressure (PAP) device is recommended. The patient will be advised to proceed with an autoPAP titration/trial at home for now. A full night titration study may be considered to optimize treatment settings, monitor proper oxygen saturations and aid with improvement of tolerance and adherence, if needed down the road. Alternative treatment options may include a dental device through dentistry or orthodontics in selected patients or Inspire (hypoglossal nerve stimulator) in carefully selected patients (meeting inclusion criteria).  Concomitant weight loss is recommended (where clinically appropriate). Please note that untreated obstructive sleep apnea may carry additional perioperative morbidity. Patients with significant obstructive sleep apnea should receive perioperative PAP therapy and the surgeons and particularly the anesthesiologist should be informed of the diagnosis and the severity of the sleep disordered breathing. The patient should be cautioned not to drive, work at heights, or operate dangerous or heavy equipment when tired or sleepy. Review and reiteration of good sleep hygiene measures should be pursued with any patient. Other causes of the patient's symptoms, including circadian rhythm disturbances, an underlying mood disorder, medication effect and/or an underlying medical problem cannot be ruled out based on this test. Clinical correlation is recommended.  The patient and her referring provider will be notified of the test results. The  patient will be seen in follow up in sleep clinic at [facility].  I certify that I have reviewed the raw data recording prior to the issuance of this report in accordance with the standards of the American Academy  of Sleep Medicine (AASM).    INTERPRETING PHYSICIAN:   Huston Foley, MD, PhD Medical Director, [facility]) Diplomat, ABPN (Neurology and Sleep)   [facility] [address], [address] [place], [place] [address] [phone]

## 2023-08-02 NOTE — Addendum Note (Signed)
Addended by: Huston Foley on: 08/02/2023 04:14 PM   Modules accepted: Orders

## 2023-08-03 ENCOUNTER — Encounter: Payer: Self-pay | Admitting: Emergency Medicine

## 2023-08-06 ENCOUNTER — Telehealth: Payer: Self-pay | Admitting: *Deleted

## 2023-08-06 NOTE — Telephone Encounter (Signed)
 Zott, Linnell Fulling, Otilio Jefferson, RN; [place], [place] Got It thank you

## 2023-08-06 NOTE — Telephone Encounter (Signed)
-----   Message from Huston Foley sent at 08/02/2023  4:14 PM EST ----- Patient referred by PCP, seen by me on 07/04/2023, patient had a HST on 07/21/2023.    Please call and notify the patient that the recent home sleep test showed obstructive sleep apnea in the moderate range. I recommend treatment in the form of autoPAP, which means, that we don't have to bring her in for a sleep study with CPAP, but will let her start using a so called autoPAP machine at home, which is a CPAP-like machine with self-adjusting pressures. We will send the order to a local DME company (of her choice, or as per insurance requirement). The DME representative will fit her with a mask, educate her on how to use the machine, how to put the mask on, etc. I have placed an order in the chart. Please send the order, talk to patient, send report to referring MD. We will need a FU in sleep clinic for 10 weeks post-PAP set up, please arrange that with me or one of our NPs. Also reinforce the need for compliance with treatment. Thanks,   Huston Foley, MD, PhD [facility])

## 2023-08-06 NOTE — Telephone Encounter (Signed)
 I spoke with the patient and discussed her sleep study results.  The patient verbalized understanding and is amenable to being set up on AutoPap therapy for moderate sleep apnea.  The patient's questions were answered.  She did not have a preference for DME company except that it is in [place].  I referred her to [facility].  The patient will watch for a call from them within 1 week.  We discussed the insurance compliance requirements which includes using the machine for at least 4 hours at night and also being seen by our office between 30 and 90 days after set up.  The patient scheduled an initial follow-up appointment for Wednesday May 14th (preferred Wednesdays) at 9:00 AM.    Autopap order sent to [facility]. Sleep study result sent to Dr Alvy Bimler.

## 2023-08-23 ENCOUNTER — Encounter (INDEPENDENT_AMBULATORY_CARE_PROVIDER_SITE_OTHER): Payer: Self-pay

## 2023-09-06 ENCOUNTER — Ambulatory Visit: Admitting: Podiatry

## 2023-09-12 ENCOUNTER — Ambulatory Visit: Admitting: Podiatry

## 2023-09-12 ENCOUNTER — Ambulatory Visit (INDEPENDENT_AMBULATORY_CARE_PROVIDER_SITE_OTHER)

## 2023-09-12 ENCOUNTER — Encounter: Payer: Self-pay | Admitting: Podiatry

## 2023-09-12 DIAGNOSIS — M7661 Achilles tendinitis, right leg: Secondary | ICD-10-CM

## 2023-09-12 MED ORDER — METHYLPREDNISOLONE 4 MG PO TBPK: ORAL_TABLET | ORAL | 0 refills | Status: DC

## 2023-09-12 NOTE — Progress Notes (Signed)
  Subjective:  Patient ID: Laurie Hodge, female    DOB: [birth year],   MRN: [id number]  No chief complaint on file.   63 y.o. female presents for concern of right heel pain that has been ongoing for a few months. Relates the back of the heel area is the most painful.  She states the first steps in the morning are the most painful. She has tried wearing more tennis shoes and helped. She has been on anti-inflammatories. Denies any other pedal complaints. Denies n/v/f/c.   Past Medical History:  Diagnosis Date   Allergy    Anemia    Arthritis    RA   Depression    External hemorrhoids    GERD (gastroesophageal reflux disease)    H. pylori infection    many years ago   Hemorrhoid    Hiatal hernia    HSV-2 infection    IgG   HTN (hypertension)    Hyperlipidemia    Hyperplastic colon polyp    Iron (Fe) deficiency anemia    Obesity, Class III, BMI 40-49.9 (morbid obesity) (HCC)    OSA on CPAP    cpap broke this week   Rheumatoid arthritis (HCC)    Sleep apnea    do not use her cipap   Ulcer    gastric - h. pylori positive   Vitamin D insufficiency     Objective:  Physical Exam: Vascular: DP/PT pulses 2/4 bilateral. CFT <3 seconds. Normal hair growth on digits. No edema.  Skin. No lacerations or abrasions bilateral feet.  Musculoskeletal: MMT 5/5 bilateral lower extremities in DF, PF, Inversion and Eversion. Deceased ROM in DF of ankle joint. Right achilles tender to insertion site and some pain with DF and PF. No pain along medial calcaneal tubercle PT or peroneal tendons.  Neurological: Sensation intact to light touch.   Assessment:   1. Tendonitis, Achilles, right      Plan:  Patient was evaluated and treated and all questions answered. -Xrays reviewed. No acute fractures or dislocations. Spurring noted to posterior calcaneus with haglund's deformity  -Discussed Achilles insertional tendonitis and treatment options with patient.  -Discussed stretching exercises. -Rx  Meloxicam provided refill. Medrol dose pack prescirbed as well.  -Heel lifts provided and discussed proper shoewear.  -Discussed if no improvement will consider MRI/PT/EPAT/PRP injections.  -Patient to return to office in 6 weeks for recheck.    Louann Sjogren, DPM

## 2023-09-12 NOTE — Patient Instructions (Signed)

## 2023-10-24 ENCOUNTER — Ambulatory Visit: Admitting: Podiatry

## 2023-10-29 NOTE — Progress Notes (Unsigned)
 PATIENT: Laurie Hodge DOB: May 26, 1961  REASON FOR VISIT: follow up HISTORY FROM: patient  No chief complaint on file.    HISTORY OF PRESENT ILLNESS:  10/29/23 ALL:  Laurie Hodge is a 63 y.o. female here today for follow up for OSA on CPAP.  She was last seen by Laurie Laurie Hodge 06/2023 for history of OSA, previously managed by our office. HST performed 06/2023 and showed moderate obstructive sleep apnea with a total AHI of 18.9/hour and O2 nadir of 77%. AutoPAP ordered. Since,     HISTORY: (copied from Laurie Laurie Hodge previous note)  Dear Laurie. Vedia Hodge,   I saw your patient, Laurie Hodge, upon your kind request in my sleep clinic today for initial consultation of her sleep disorder, in particular, evaluation of her prior diagnosis of obstructive sleep apnea.  The patient is unaccompanied today.  As you know, Laurie Hodge is a 63 year old female with an underlying medical history of rheumatoid arthritis, hypertension, hyperlipidemia, vitamin D  deficiency, reflux disease, allergies, anemia, depression, and severe obesity with a BMI of over 40, status post gastric sleeve in 2016, who was diagnosed with obstructive sleep apnea several years ago.  She was previously followed in our sleep clinic but has not been seen in over 7 years.  I was able to review her PAP compliance data from 2017, she was compliant with her CPAP of 11 cm at the time.  She reports some weight fluctuation and interim increase in her weight.  She reports that her sister has recently commented on her heavy snoring and gurgling sound as well as breathing pauses while asleep.  Her Epworth sleepiness score is 12, fatigue severity score is 63 out of 63.  She is divorced and lives alone, she has 3 grown children, ages 16, 102, and 94.  She has no pets in the household.  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  She goes to bed around 11 and rise time is around 5:45 AM.  She has nocturia about once per average night.  She  denies recurrent nocturnal morning headaches.  She previously benefited from PAP therapy and would be willing to consider it again.  She brought her previous CPAP machine which is a ResMed air sense 10 AutoSet machine with a set pressure of 11 cm with EPR of 1.    I reviewed your office note from 05/16/2023. She had a baseline sleep study through our office on 01/16/2016 for reevaluation of her obstructive sleep apnea as she had lost weight in the context of her weight loss surgery.  At that time her AHI was 9.6/h, O2 nadir 93%.  She did have significant PLM's at the time with mild associated arousals.  Her weight was 196 lb at the time, current weight at 260 lb.  She follows with medical weight management.     She drinks no daily caffeine.  She is a non-smoker.  She drinks alcohol rarely.   REVIEW OF SYSTEMS: Out of a complete 14 system review of symptoms, the patient complains only of the following symptoms, and all other reviewed systems are negative.  ESS:  ALLERGIES: Allergies  Allergen Reactions   Adhesive [Tape] Rash    Skin Peels    HOME MEDICATIONS: Outpatient Medications Prior to Visit  Medication Sig Dispense Refill   amLODipine  (NORVASC ) 5 MG tablet Take 1 tablet (5 mg total) by mouth daily. 90 tablet 3   atorvastatin  (LIPITOR) 40 MG tablet Take 1 tablet by mouth  once daily 90 tablet 0   Biotin 5 MG CAPS Take by mouth.     Cholecalciferol (VITAMIN D ) 50 MCG (2000 UT) tablet Pt will increase to 4000 one day and 6,000 the next. (  change to 5,000 IU daily)     Cyanocobalamin (VITAMIN B-12) 500 MCG SUBL Place 2,500 mcg under the tongue 3 (three) times a week.     ENBREL SURECLICK 50 MG/ML injection Inject 50 mg into the skin once a week.     FERREX 150 150 MG capsule Take 1 capsule by mouth twice daily 180 capsule 0   Fish Oil-Krill Oil (KRILL OIL PLUS) CAPS Take 750 mg by mouth daily.     ketoconazole  (NIZORAL ) 2 % cream Apply 1 Application topically daily. 60 g 2   meloxicam   (MOBIC ) 7.5 MG tablet Take 1 tablet (7.5 mg total) by mouth as needed for pain. 30 tablet 1   methylPREDNISolone  (MEDROL  DOSEPAK) 4 MG TBPK tablet Take as directed 21 tablet 0   valsartan -hydrochlorothiazide  (DIOVAN -HCT) 160-12.5 MG tablet Take 1 tablet by mouth once daily 90 tablet 3   No facility-administered medications prior to visit.    PAST MEDICAL HISTORY: Past Medical History:  Diagnosis Date   Allergy    Anemia    Arthritis    RA   Depression    External hemorrhoids    GERD (gastroesophageal reflux disease)    H. pylori infection    many years ago   Hemorrhoid    Hiatal hernia    HSV-2 infection    IgG   HTN (hypertension)    Hyperlipidemia    Hyperplastic colon polyp    Iron  (Fe) deficiency anemia    Obesity, Class III, BMI 40-49.9 (morbid obesity) (HCC)    OSA on CPAP    cpap broke this week   Rheumatoid arthritis (HCC)    Sleep apnea    do not use her cipap   Ulcer    gastric - h. pylori positive   Vitamin D  insufficiency     PAST SURGICAL HISTORY: Past Surgical History:  Procedure Laterality Date   LAPAROSCOPIC GASTRIC SLEEVE RESECTION N/A 05/31/2015   Procedure: LAPAROSCOPIC GASTRIC SLEEVE RESECTION;  Surgeon: Derral Flick, MD;  Location: [facility];  Service: General;  Laterality: N/A;   TUBAL LIGATION      FAMILY HISTORY: Family History  Problem Relation Age of Onset   Diabetes Mother    Hypertension Mother    Dementia Mother    Stroke Mother 64   Hyperlipidemia Mother    Depression Mother    Anxiety disorder Mother    Obesity Mother    Heart disease Father    Hyperlipidemia Father    Hypertension Father    Stroke Father    Depression Father    Sleep apnea Father    Obesity Father    Diabetes Sister    Heart disease Sister    Hyperlipidemia Sister    Hypertension Sister    Heart disease Sister    Hyperlipidemia Sister    Hypertension Sister    Diabetes Brother    Hyperlipidemia Brother    Hypertension Brother     Hypertension Son    Cancer Paternal Aunt        breast ca x 5 maternal aunts   Colon cancer Neg Hx    Esophageal cancer Neg Hx    Rectal cancer Neg Hx    Stomach cancer Neg Hx     SOCIAL HISTORY: Social History  Socioeconomic History   Marital status: Divorced    Spouse name: n/a   Number of children: 3   Years of education: 12   Highest education level: Not on file  Occupational History   Occupation: TEACHER'S ASSISTANT    Employer: [employer]    Employer: [employer]  Tobacco Use   Smoking status: Never   Smokeless tobacco: Never  Vaping Use   Vaping status: Never Used  Substance and Sexual Activity   Alcohol use: Yes    Alcohol/week: 0.0 standard drinks of alcohol    Comment: 2-3 times a year at special events   Drug use: No   Sexual activity: Not Currently    Partners: Male    Comment: not active since divorce from husband 2010  Other Topics Concern   Not on file  Social History Narrative   Lives alone     Caffeine: hot chocolate a couple times a week   Social Drivers of Corporate investment banker Strain: Not on file  Food Insecurity: Not on file  Transportation Needs: Not on file  Physical Activity: Not on file  Stress: Not on file  Social Connections: Not on file  Intimate Partner Violence: Not on file     PHYSICAL EXAM  There were no vitals filed for this visit. There is no height or weight on file to calculate BMI.  Generalized: Well developed, in no acute distress  Cardiology: normal rate and rhythm, no murmur noted Respiratory: clear to auscultation bilaterally  Neurological examination  Mentation: Alert oriented to time, place, history taking. Follows all commands speech and language fluent Cranial nerve II-XII: Pupils were equal round reactive to light. Extraocular movements were full, visual field were full  Motor: The motor testing reveals 5 over 5 strength of all 4 extremities. Good symmetric motor tone is noted  throughout.  Gait and station: Gait is normal.    DIAGNOSTIC DATA (LABS, IMAGING, TESTING) - I reviewed patient records, labs, notes, testing and imaging myself where available.      No data to display           Lab Results  Component Value Date   WBC 5.4 08/01/2023   HGB 13.6 08/01/2023   HCT 41.8 08/01/2023   MCV 87.0 08/01/2023   PLT 337.0 08/01/2023      Component Value Date/Time   NA 141 08/01/2023 1608   NA 142 05/16/2023 0851   K 3.7 08/01/2023 1608   CL 102 08/01/2023 1608   CO2 32 08/01/2023 1608   GLUCOSE 86 08/01/2023 1608   BUN 17 08/01/2023 1608   BUN 9 05/16/2023 0851   CREATININE 0.61 08/01/2023 1608   CREATININE 0.60 02/29/2016 1133   CALCIUM  9.6 08/01/2023 1608   PROT 8.2 08/01/2023 1608   PROT 7.5 05/16/2023 0851   ALBUMIN 4.4 08/01/2023 1608   ALBUMIN 4.5 05/16/2023 0851   AST 21 08/01/2023 1608   ALT 29 08/01/2023 1608   ALKPHOS 72 08/01/2023 1608   BILITOT 0.9 08/01/2023 1608   BILITOT 0.9 05/16/2023 0851   GFRNONAA 102 01/08/2019 1446   GFRNONAA >89 10/02/2013 1400   GFRAA 117 01/08/2019 1446   GFRAA >89 10/02/2013 1400   Lab Results  Component Value Date   CHOL 165 08/01/2023   HDL 53.80 08/01/2023   LDLCALC 89 08/01/2023   TRIG 112.0 08/01/2023   CHOLHDL 3 08/01/2023   Lab Results  Component Value Date   HGBA1C 6.5 (H) 05/16/2023   Lab  Results  Component Value Date   VITAMINB12 >2000 (H) 05/16/2023   Lab Results  Component Value Date   TSH 1.64 08/01/2023     ASSESSMENT AND PLAN 63 y.o. year old female  has a past medical history of Allergy, Anemia, Arthritis, Depression, External hemorrhoids, GERD (gastroesophageal reflux disease), H. pylori infection, Hemorrhoid, Hiatal hernia, HSV-2 infection, HTN (hypertension), Hyperlipidemia, Hyperplastic colon polyp, Iron  (Fe) deficiency anemia, Obesity, Class III, BMI 40-49.9 (morbid obesity) (HCC), OSA on CPAP, Rheumatoid arthritis (HCC), Sleep apnea, Ulcer, and Vitamin D   insufficiency. here with   No diagnosis found.    Rudean SHATIQUA NGUYEN is doing well on CPAP therapy. Compliance report reveals ***. *** was encouraged to continue using CPAP nightly and for greater than 4 hours each night. We will update supply orders as indicated. Risks of untreated sleep apnea review and education materials provided. Healthy lifestyle habits encouraged. *** will follow up in ***, sooner if needed. *** verbalizes understanding and agreement with this plan.    No orders of the defined types were placed in this encounter.    No orders of the defined types were placed in this encounter.     Terrilyn Fick, FNP-C 10/29/2023, 12:06 PM [facility] [address] [place], [place] [address] [phone]

## 2023-10-29 NOTE — Patient Instructions (Incomplete)

## 2023-10-30 NOTE — Progress Notes (Unsigned)
 Laurie Hodge

## 2023-10-31 ENCOUNTER — Encounter: Payer: Self-pay | Admitting: Family Medicine

## 2023-10-31 ENCOUNTER — Ambulatory Visit: Payer: 59 | Admitting: Family Medicine

## 2023-10-31 VITALS — BP 176/77 | HR 55 | Ht 65.5 in | Wt 265.2 lb

## 2023-10-31 DIAGNOSIS — R009 Unspecified abnormalities of heart beat: Secondary | ICD-10-CM

## 2023-10-31 DIAGNOSIS — G4733 Obstructive sleep apnea (adult) (pediatric): Secondary | ICD-10-CM | POA: Diagnosis not present

## 2023-10-31 DIAGNOSIS — R001 Bradycardia, unspecified: Secondary | ICD-10-CM

## 2023-10-31 NOTE — Progress Notes (Signed)
 Community message sent to [facility] that order placed.

## 2023-11-08 ENCOUNTER — Other Ambulatory Visit: Payer: Self-pay | Admitting: Emergency Medicine

## 2023-11-08 DIAGNOSIS — D500 Iron deficiency anemia secondary to blood loss (chronic): Secondary | ICD-10-CM

## 2023-11-16 ENCOUNTER — Other Ambulatory Visit: Payer: Self-pay | Admitting: Emergency Medicine

## 2023-11-16 DIAGNOSIS — E785 Hyperlipidemia, unspecified: Secondary | ICD-10-CM

## 2023-12-24 ENCOUNTER — Telehealth: Payer: Self-pay | Admitting: Family Medicine

## 2023-12-24 DIAGNOSIS — G4733 Obstructive sleep apnea (adult) (pediatric): Secondary | ICD-10-CM

## 2023-12-24 NOTE — Telephone Encounter (Signed)
 Will you please attach 90 day report, please? TY!

## 2023-12-24 NOTE — Telephone Encounter (Signed)
 SABRA

## 2023-12-25 NOTE — Telephone Encounter (Signed)
 Community message sent to [facility] for pressure adjust.

## 2023-12-25 NOTE — Addendum Note (Signed)
 Addended by: CARY NO L on: 12/25/2023 08:11 AM   Modules accepted: Orders

## 2024-01-28 ENCOUNTER — Telehealth: Payer: Self-pay | Admitting: Family Medicine

## 2024-01-28 NOTE — Telephone Encounter (Signed)
 Laurie Hodge

## 2024-01-28 NOTE — Telephone Encounter (Signed)
 Please attach 90 day report for review of AHI following pressure adjustment. TY!

## 2024-02-02 ENCOUNTER — Other Ambulatory Visit: Payer: Self-pay | Admitting: Emergency Medicine

## 2024-02-02 DIAGNOSIS — I10 Essential (primary) hypertension: Secondary | ICD-10-CM

## 2024-02-20 ENCOUNTER — Other Ambulatory Visit: Payer: Self-pay | Admitting: Emergency Medicine

## 2024-02-20 DIAGNOSIS — E785 Hyperlipidemia, unspecified: Secondary | ICD-10-CM

## 2024-02-20 DIAGNOSIS — D500 Iron deficiency anemia secondary to blood loss (chronic): Secondary | ICD-10-CM

## 2024-03-11 ENCOUNTER — Encounter: Payer: Self-pay | Admitting: Emergency Medicine

## 2024-03-11 ENCOUNTER — Ambulatory Visit: Admitting: Emergency Medicine

## 2024-03-11 VITALS — BP 140/78 | HR 64 | Temp 98.0°F | Ht 65.5 in | Wt 257.8 lb

## 2024-03-11 DIAGNOSIS — R300 Dysuria: Secondary | ICD-10-CM | POA: Diagnosis not present

## 2024-03-11 DIAGNOSIS — N3000 Acute cystitis without hematuria: Secondary | ICD-10-CM | POA: Insufficient documentation

## 2024-03-11 LAB — POC URINALSYSI DIPSTICK (AUTOMATED)
Bilirubin, UA: NEGATIVE
Blood, UA: NEGATIVE
Glucose, UA: POSITIVE — AB
Ketones, UA: NEGATIVE
Leukocytes, UA: NEGATIVE
Nitrite, UA: NEGATIVE
Protein, UA: NEGATIVE
Spec Grav, UA: 1.015 (ref 1.010–1.025)
Urobilinogen, UA: 0.2 U/dL
pH, UA: 6 (ref 5.0–8.0)

## 2024-03-11 MED ORDER — CEFUROXIME AXETIL 250 MG PO TABS: 250.0000 mg | ORAL_TABLET | Freq: Two times a day (BID) | ORAL | 0 refills | Status: DC

## 2024-03-11 NOTE — Assessment & Plan Note (Signed)
 Symptom management discussed Advised to stay well-hydrated May use over-the-counter Azo for symptom management Start antibiotic as prescribed Contact the office if no better or worse during the next several days

## 2024-03-11 NOTE — Patient Instructions (Signed)
 Dysuria Dysuria is pain or discomfort when you pee. The pain may be felt in your urethra, which is the part of your body that drains pee (urine) from your bladder. The pain may also be felt near your genitals, groin, or in your lower belly or back. You may have to pee often or have the sudden feeling that you need to pee. This condition can affect anyone, but it's more common in females. It can be caused by: A urinary tract infection (UTI). Kidney stones or bladder stones. Some sexually transmitted infections (STIs). Dehydration. This is when there's not enough water in your body. Irritation and swelling in the vagina. The use of some medicines. The use of some soaps or products with a scent. Follow these instructions at home: Medicines  Take your medicines only as told. Take your antibiotics as told. Do not stop taking them even if you start to feel better. Eating and drinking Drink enough fluid to keep your pee pale yellow. Certain drinks can make the pain worse. Avoid: Drinks with caffeine in them. Tea. Alcohol. In males, alcohol may irritate the prostate. General instructions Watch your condition for any changes, such as color changes in your pee. Pee often. Do not hold your pee for a long time. If you're female, wipe from front to back after you pee or poop. Use each tissue only once when you wipe. Pee after you have sex. If you've had any tests done, it's up to you to get your test results. Ask your health care provider, or the department doing the test, when your results will be ready. Contact a health care provider if: You have a fever or chills. You have pain in your back or sides. You throw up or feel like you may throw up. You have blood in your pee. You're not peeing as often as normal. You feel very weak. Get help right away if: You have very bad pain that doesn't get better with medicine. You're confused. You have a fast heartbeat while resting. This information is  not intended to replace advice given to you by your health care provider. Make sure you discuss any questions you have with your health care provider. Document Revised: 10/10/2022 Document Reviewed: 10/10/2022 Elsevier Patient Education  2024 [employer].

## 2024-03-11 NOTE — Progress Notes (Signed)
 Laurie Hodge 63 y.o.   Chief Complaint  Patient presents with   Dysuria    Patient here for possible UTI and possible vaginal bacteria due to a hemorrhoid flare up . Symptoms started Friday, she is having urgency, lower abd pain and lower back pain. She also mentions having a dry mouth and a funny taste in her mouth that started around the same time as other symptoms    HISTORY OF PRESENT ILLNESS: This is a 63 y.o. female complaining of burning on urination that started about 4 days ago Earlier during the week hide hemorrhoid flareup with rectal pain and some bleeding. Has occasional low back pain Dry mouth with a funny taste that started 1 month ago when she started drinking caffeinated [employer] product Denies fever or chills.  Denies nausea or vomiting.  Able to eat and drink. Denies any other associated symptoms No other complaints or medical concerns today.  Dysuria  Pertinent negatives include no chills, nausea or vomiting.     Prior to Admission medications   Medication Sig Start Date End Date Taking? Authorizing Provider  amLODipine  (NORVASC ) 5 MG tablet Take 1 tablet by mouth once daily 02/03/24  Yes Holy Battenfield Jose, MD  atorvastatin  (LIPITOR) 40 MG tablet Take 1 tablet by mouth once daily 02/21/24  Yes Boyd Buffalo Jose, MD  Biotin 5 MG CAPS Take by mouth.   Yes [provider]  Cholecalciferol (VITAMIN D ) 50 MCG (2000 UT) tablet Pt will increase to 4000 one day and 6,000 the next. (  change to 5,000 IU daily) 05/30/23  Yes Opalski, Barnie, DO  Cyanocobalamin (VITAMIN B-12) 500 MCG SUBL Place 2,500 mcg under the tongue once a week.   Yes [provider]  ENBREL SURECLICK 50 MG/ML injection Inject 50 mg into the skin once a week. 06/22/23  Yes [provider]  FERREX 150 150 MG capsule Take 1 capsule by mouth twice daily 02/21/24  Yes Chaquita Basques, Haskell, MD  Fish Oil-Krill Oil (KRILL OIL PLUS) CAPS Take 750 mg by mouth daily.   Yes  [provider]  hydroxychloroquine (PLAQUENIL) 200 MG tablet Take 200 mg by mouth 2 (two) times daily. 02/27/24  Yes [provider]  ketoconazole  (NIZORAL ) 2 % cream Apply 1 Application topically daily. 08/01/23  Yes Selwyn Reason, Emil Schanz, MD  meloxicam  (MOBIC ) 7.5 MG tablet Take 1 tablet (7.5 mg total) by mouth as needed for pain. 09/25/17  Yes Juliane Che, PA  valsartan -hydrochlorothiazide  (DIOVAN -HCT) 160-12.5 MG tablet Take 1 tablet by mouth once daily 04/11/23  Yes Raynisha Avilla, Emil Schanz, MD    Allergies  Allergen Reactions   Adhesive [Tape] Rash    Skin Peels    Patient Active Problem List   Diagnosis Date Noted   Viral respiratory illness 08/01/2023   Other fatigue 08/01/2023   Morbid obesity (HCC) 12/19/2022   Tinea versicolor 01/07/2020   Body mass index (BMI) of 40.1-44.9 in adult [facility]) 01/07/2020   Osteoarthritis 02/05/2015   Dyslipidemia 11/06/2013   Rheumatoid arthritis (HCC) 06/10/2013   BMI 36.0-36.9,adult    GERD (gastroesophageal reflux disease)    OSA (obstructive sleep apnea)     Class: Minor   Essential hypertension    Vitamin D  insufficiency    Iron  (Fe) deficiency anemia     Past Medical History:  Diagnosis Date   Allergy    Anemia    Arthritis    RA   Depression    External hemorrhoids    GERD (gastroesophageal reflux  disease)    H. pylori infection    many years ago   Hemorrhoid    Hiatal hernia    HSV-2 infection    IgG   HTN (hypertension)    Hyperlipidemia    Hyperplastic colon polyp    Iron  (Fe) deficiency anemia    Obesity, Class III, BMI 40-49.9 (morbid obesity)    OSA on CPAP    cpap broke this week   Rheumatoid arthritis (HCC)    Sleep apnea    do not use her cipap   Ulcer    gastric - h. pylori positive   Vitamin D  insufficiency     Past Surgical History:  Procedure Laterality Date   LAPAROSCOPIC GASTRIC SLEEVE RESECTION N/A 05/31/2015   Procedure: LAPAROSCOPIC GASTRIC SLEEVE RESECTION;  Surgeon: Herlene Righter Kinsinger, MD;  Location: [facility];  Service: General;  Laterality: N/A;   TUBAL LIGATION      Social History   Socioeconomic History   Marital status: Divorced    Spouse name: n/a   Number of children: 3   Years of education: 12   Highest education level: Not on file  Occupational History   Occupation: TEACHER'S ASSISTANT    Employer: [employer]    Employer: [employer]  Tobacco Use   Smoking status: Never   Smokeless tobacco: Never  Vaping Use   Vaping status: Never Used  Substance and Sexual Activity   Alcohol use: Yes    Alcohol/week: 0.0 standard drinks of alcohol    Comment: 2-3 times a year at special events   Drug use: No   Sexual activity: Not Currently    Partners: Male    Comment: not active since divorce from husband 2010  Other Topics Concern   Not on file  Social History Narrative   Lives alone     Caffeine: hot chocolate a couple times a week   Social Drivers of Corporate investment banker Strain: Not on file  Food Insecurity: Not on file  Transportation Needs: Not on file  Physical Activity: Not on file  Stress: Not on file  Social Connections: Not on file  Intimate Partner Violence: Not on file    Family History  Problem Relation Age of Onset   Diabetes Mother    Hypertension Mother    Dementia Mother    Stroke Mother 16   Hyperlipidemia Mother    Depression Mother    Anxiety disorder Mother    Obesity Mother    Heart disease Father    Hyperlipidemia Father    Hypertension Father    Stroke Father    Depression Father    Sleep apnea Father    Obesity Father    Diabetes Sister    Heart disease Sister    Hyperlipidemia Sister    Hypertension Sister    Heart disease Sister    Hyperlipidemia Sister    Hypertension Sister    Diabetes Brother    Hyperlipidemia Brother    Hypertension Brother    Hypertension Son    Cancer Paternal Aunt        breast ca x 5 maternal aunts   Colon cancer Neg Hx     Esophageal cancer Neg Hx    Rectal cancer Neg Hx    Stomach cancer Neg Hx      Review of Systems  Constitutional: Negative.  Negative for chills and fever.  HENT: Negative.  Negative for congestion and sore throat.   Respiratory: Negative.  Negative for cough and shortness of breath.   Cardiovascular: Negative.  Negative for chest pain and palpitations.  Gastrointestinal:  Negative for abdominal pain, diarrhea, nausea and vomiting.  Genitourinary:  Positive for dysuria.  Skin: Negative.  Negative for rash.  Neurological: Negative.  Negative for dizziness and headaches.  All other systems reviewed and are negative.  Today's Vitals   03/11/24 1409  BP: (!) 140/78  Pulse: 64  Temp: 98 F (36.7 C)  TempSrc: Oral  SpO2: 95%  Weight: 257 lb 12.8 oz (116.9 kg)  Height: 5' 5.5 (1.664 m)   Body mass index is 42.25 kg/m.    Physical Exam Vitals reviewed.  Constitutional:      Appearance: Normal appearance.  HENT:     Head: Normocephalic.     Mouth/Throat:     Mouth: Mucous membranes are moist.     Pharynx: Oropharynx is clear.  Eyes:     Extraocular Movements: Extraocular movements intact.     Pupils: Pupils are equal, round, and reactive to light.  Cardiovascular:     Rate and Rhythm: Normal rate and regular rhythm.     Pulses: Normal pulses.     Heart sounds: Normal heart sounds.  Pulmonary:     Effort: Pulmonary effort is normal.     Breath sounds: Normal breath sounds.  Abdominal:     Palpations: Abdomen is soft.     Tenderness: There is no abdominal tenderness.  Musculoskeletal:     Cervical back: No tenderness.  Lymphadenopathy:     Cervical: No cervical adenopathy.  Skin:    General: Skin is warm and dry.     Capillary Refill: Capillary refill takes less than 2 seconds.  Neurological:     General: No focal deficit present.     Mental Status: She is alert and oriented to person, place, and time.  Psychiatric:        Mood and Affect: Mood normal.         Behavior: Behavior normal.    Results for orders placed or performed in visit on 03/11/24 (from the past 24 hours)  POCT Urinalysis Dipstick (Automated)     Status: Abnormal   Collection Time: 03/11/24  2:31 PM  Result Value Ref Range   Color, UA yellow    Clarity, UA clear    Glucose, UA Positive (A) Negative   Bilirubin, UA negative    Ketones, UA negative    Spec Grav, UA 1.015 1.010 - 1.025   Blood, UA negative    pH, UA 6.0 5.0 - 8.0   Protein, UA Negative Negative   Urobilinogen, UA 0.2 0.2 or 1.0 E.U./dL   Nitrite, UA negative    Leukocytes, UA Negative Negative      ASSESSMENT & PLAN: Problem List Items Addressed This Visit       Genitourinary   Acute cystitis without hematuria - Primary   Clinically stable.  No signs or findings of pyelonephritis.   Likely secondary to urinary tract infection Unremarkable urinalysis Urine sent for culture Recommend to start Ceftin  250 mg twice a day for 7 days Symptom management discussed Advised to rest and stay well-hydrated Advised to contact the office if no better or worse during the next several days.      Relevant Medications   cefUROXime  (CEFTIN ) 250 MG tablet   Other Relevant Orders   Urine Culture     Other   Dysuria   Symptom management discussed Advised to stay well-hydrated May use over-the-counter  Azo for symptom management Start antibiotic as prescribed Contact the office if no better or worse during the next several days      Relevant Orders   POCT Urinalysis Dipstick (Automated) (Completed)   Urine Culture   Patient Instructions  Dysuria Dysuria is pain or discomfort when you pee. The pain may be felt in your urethra, which is the part of your body that drains pee (urine) from your bladder. The pain may also be felt near your genitals, groin, or in your lower belly or back. You may have to pee often or have the sudden feeling that you need to pee. This condition can affect anyone, but it's more  common in females. It can be caused by: A urinary tract infection (UTI). Kidney stones or bladder stones. Some sexually transmitted infections (STIs). Dehydration. This is when there's not enough water in your body. Irritation and swelling in the vagina. The use of some medicines. The use of some soaps or products with a scent. Follow these instructions at home: Medicines  Take your medicines only as told. Take your antibiotics as told. Do not stop taking them even if you start to feel better. Eating and drinking Drink enough fluid to keep your pee pale yellow. Certain drinks can make the pain worse. Avoid: Drinks with caffeine in them. Tea. Alcohol. In males, alcohol may irritate the prostate. General instructions Watch your condition for any changes, such as color changes in your pee. Pee often. Do not hold your pee for a long time. If you're female, wipe from front to back after you pee or poop. Use each tissue only once when you wipe. Pee after you have sex. If you've had any tests done, it's up to you to get your test results. Ask your health care provider, or the department doing the test, when your results will be ready. Contact a health care provider if: You have a fever or chills. You have pain in your back or sides. You throw up or feel like you may throw up. You have blood in your pee. You're not peeing as often as normal. You feel very weak. Get help right away if: You have very bad pain that doesn't get better with medicine. You're confused. You have a fast heartbeat while resting. This information is not intended to replace advice given to you by your health care provider. Make sure you discuss any questions you have with your health care provider. Document Revised: 10/10/2022 Document Reviewed: 10/10/2022 Elsevier Patient Education  2024 Elsevier Inc.    Emil Schaumann, MD [facility]

## 2024-03-11 NOTE — Assessment & Plan Note (Signed)
 Clinically stable.  No signs or findings of pyelonephritis.   Likely secondary to urinary tract infection Unremarkable urinalysis Urine sent for culture Recommend to start Ceftin  250 mg twice a day for 7 days Symptom management discussed Advised to rest and stay well-hydrated Advised to contact the office if no better or worse during the next several days.

## 2024-03-12 LAB — URINE CULTURE: Result:: NO GROWTH

## 2024-03-13 ENCOUNTER — Observation Stay (HOSPITAL_COMMUNITY)
Admission: EM | Admit: 2024-03-13 | Discharge: 2024-03-15 | Disposition: A | Discharge status: Home / Self Care | Attending: Internal Medicine | Admitting: Internal Medicine

## 2024-03-13 ENCOUNTER — Encounter (HOSPITAL_COMMUNITY): Payer: Self-pay | Admitting: Emergency Medicine

## 2024-03-13 ENCOUNTER — Ambulatory Visit: Payer: Self-pay | Admitting: Emergency Medicine

## 2024-03-13 ENCOUNTER — Other Ambulatory Visit: Payer: Self-pay

## 2024-03-13 ENCOUNTER — Ambulatory Visit (HOSPITAL_COMMUNITY)
Admission: EM | Admit: 2024-03-13 | Discharge: 2024-03-13 | Disposition: A | Discharge status: Home / Self Care | Attending: Family Medicine | Admitting: Family Medicine

## 2024-03-13 DIAGNOSIS — E119 Type 2 diabetes mellitus without complications: Secondary | ICD-10-CM | POA: Diagnosis not present

## 2024-03-13 DIAGNOSIS — R739 Hyperglycemia, unspecified: Secondary | ICD-10-CM | POA: Diagnosis present

## 2024-03-13 DIAGNOSIS — Z6841 Body Mass Index (BMI) 40.0 and over, adult: Secondary | ICD-10-CM | POA: Insufficient documentation

## 2024-03-13 DIAGNOSIS — E785 Hyperlipidemia, unspecified: Secondary | ICD-10-CM | POA: Diagnosis present

## 2024-03-13 DIAGNOSIS — Z794 Long term (current) use of insulin: Secondary | ICD-10-CM

## 2024-03-13 DIAGNOSIS — F1090 Alcohol use, unspecified, uncomplicated: Secondary | ICD-10-CM | POA: Insufficient documentation

## 2024-03-13 DIAGNOSIS — M069 Rheumatoid arthritis, unspecified: Secondary | ICD-10-CM | POA: Diagnosis not present

## 2024-03-13 DIAGNOSIS — R350 Frequency of micturition: Secondary | ICD-10-CM | POA: Diagnosis not present

## 2024-03-13 DIAGNOSIS — G4733 Obstructive sleep apnea (adult) (pediatric): Secondary | ICD-10-CM | POA: Diagnosis not present

## 2024-03-13 DIAGNOSIS — Z79899 Other long term (current) drug therapy: Secondary | ICD-10-CM | POA: Insufficient documentation

## 2024-03-13 DIAGNOSIS — E66813 Obesity, class 3: Secondary | ICD-10-CM | POA: Diagnosis present

## 2024-03-13 DIAGNOSIS — I10 Essential (primary) hypertension: Secondary | ICD-10-CM | POA: Diagnosis not present

## 2024-03-13 DIAGNOSIS — Z23 Encounter for immunization: Secondary | ICD-10-CM | POA: Diagnosis not present

## 2024-03-13 DIAGNOSIS — E1159 Type 2 diabetes mellitus with other circulatory complications: Secondary | ICD-10-CM | POA: Diagnosis present

## 2024-03-13 LAB — URINALYSIS, ROUTINE W REFLEX MICROSCOPIC
Bilirubin Urine: NEGATIVE
Glucose, UA: >=500 mg/dL — AB
Hgb urine dipstick: NEGATIVE
Ketones, ur: 20 mg/dL — AB
Leukocytes,Ua: NEGATIVE
Nitrite: NEGATIVE
Protein, ur: NEGATIVE mg/dL
Specific Gravity, Urine: 1.029 (ref 1.005–1.030)
pH: 5 (ref 5.0–8.0)

## 2024-03-13 LAB — CBC WITH DIFFERENTIAL/PLATELET
Abs Immature Granulocytes: 0.03 K/uL (ref 0.00–0.07)
Basophils Absolute: 0.1 K/uL (ref 0.0–0.1)
Basophils Relative: 1 %
Eosinophils Absolute: 0.1 K/uL (ref 0.0–0.5)
Eosinophils Relative: 1 %
HCT: 46.1 % — ABNORMAL HIGH (ref 36.0–46.0)
Hemoglobin: 15.1 g/dL — ABNORMAL HIGH (ref 12.0–15.0)
Immature Granulocytes: 0 %
Lymphocytes Relative: 33 %
Lymphs Abs: 3.1 K/uL (ref 0.7–4.0)
MCH: 28.2 pg (ref 26.0–34.0)
MCHC: 32.8 g/dL (ref 30.0–36.0)
MCV: 86.2 fL (ref 80.0–100.0)
Monocytes Absolute: 0.7 K/uL (ref 0.1–1.0)
Monocytes Relative: 7 %
Neutro Abs: 5.3 K/uL (ref 1.7–7.7)
Neutrophils Relative %: 58 %
Platelets: 377 K/uL (ref 150–400)
RBC: 5.35 MIL/uL — ABNORMAL HIGH (ref 3.87–5.11)
RDW: 12.5 % (ref 11.5–15.5)
WBC: 9.3 K/uL (ref 4.0–10.5)
nRBC: 0 % (ref 0.0–0.2)

## 2024-03-13 LAB — BETA-HYDROXYBUTYRIC ACID: Beta-Hydroxybutyric Acid: 1.11 mmol/L — ABNORMAL HIGH (ref 0.05–0.27)

## 2024-03-13 LAB — POCT URINALYSIS DIP (MANUAL ENTRY)
Bilirubin, UA: NEGATIVE
Glucose, UA: 500 mg/dL — AB
Leukocytes, UA: NEGATIVE
Nitrite, UA: NEGATIVE
Spec Grav, UA: 1.01 (ref 1.010–1.025)
Urobilinogen, UA: 0.2 U/dL
pH, UA: 5.5 (ref 5.0–8.0)

## 2024-03-13 LAB — I-STAT VENOUS BLOOD GAS, ED
Acid-Base Excess: 0 mmol/L (ref 0.0–2.0)
Bicarbonate: 24.8 mmol/L (ref 20.0–28.0)
Calcium, Ion: 1.19 mmol/L (ref 1.15–1.40)
HCT: 47 % — ABNORMAL HIGH (ref 36.0–46.0)
Hemoglobin: 16 g/dL — ABNORMAL HIGH (ref 12.0–15.0)
O2 Saturation: 87 %
Potassium: 4.2 mmol/L (ref 3.5–5.1)
Sodium: 138 mmol/L (ref 135–145)
TCO2: 26 mmol/L (ref 22–32)
pCO2, Ven: 39.6 mmHg — ABNORMAL LOW (ref 44–60)
pH, Ven: 7.405 (ref 7.25–7.43)
pO2, Ven: 52 mmHg — ABNORMAL HIGH (ref 32–45)

## 2024-03-13 LAB — BASIC METABOLIC PANEL WITH GFR
Anion gap: 19 — ABNORMAL HIGH (ref 5–15)
BUN: 22 mg/dL (ref 8–23)
CO2: 22 mmol/L (ref 22–32)
Calcium: 9.9 mg/dL (ref 8.9–10.3)
Chloride: 95 mmol/L — ABNORMAL LOW (ref 98–111)
Creatinine, Ser: 1.16 mg/dL — ABNORMAL HIGH (ref 0.44–1.00)
GFR, Estimated: 54 mL/min — ABNORMAL LOW (ref >60)
Glucose, Bld: 559 mg/dL (ref 70–99)
Potassium: 4.1 mmol/L (ref 3.5–5.1)
Sodium: 136 mmol/L (ref 135–145)

## 2024-03-13 LAB — HEMOGLOBIN A1C
Hgb A1c MFr Bld: 10.1 % — ABNORMAL HIGH (ref 4.8–5.6)
Mean Plasma Glucose: 243.17 mg/dL

## 2024-03-13 LAB — POCT FASTING CBG KUC MANUAL ENTRY: POCT Glucose (KUC): 508 mg/dL — AB (ref 70–99)

## 2024-03-13 NOTE — ED Notes (Signed)
 Patient is being discharged from the Urgent Care and sent to the Emergency Department via private vehicle . Per provider, patient is in need of higher level of care due to hyperglycemia. Patient is aware and verbalizes understanding of plan of care.  Vitals:   03/13/24 1934  BP: (!) 148/83  Pulse: 63  Resp: 20  Temp: 98 F (36.7 C)  SpO2: 97%

## 2024-03-13 NOTE — Discharge Instructions (Addendum)
 Your blood sugar was over 500 today in clinic.  Please head to the nearest emergency department for further advanced evaluation.  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

## 2024-03-13 NOTE — ED Provider Triage Note (Signed)
 Emergency Medicine Provider Triage Evaluation Note  Laurie Hodge , a 63 y.o. female  was evaluated in triage.  Pt complains of increased thirst urination dry mouth.  Thought she might have a UTI had her urine checked the other day and there was sugar in her urine.  She had continued symptoms so she saw someone at the urgent care and was noted to have a blood sugar over 500.  She is not previously diagnosed with diabetes..  Review of Systems  Positive: Urinary frequency and urgency Negative: Fever  Physical Exam  BP (!) 160/93 (BP Location: Right Arm)   Pulse 70   Temp 98.5 F (36.9 C)   Resp (!) 22   LMP 12/26/2018   SpO2 92%  Gen:   Awake, no distress   Resp:  Normal effort  MSK:   Moves extremities without difficulty  Other:    Medical Decision Making  Medically screening exam initiated at 9:50 PM.  Appropriate orders placed.  Laurie Hodge was informed that the remainder of the evaluation will be completed by another provider, this initial triage assessment does not replace that evaluation, and the importance of remaining in the ED until their evaluation is complete.     Laurie Chroman, PA-C 03/13/24 2155

## 2024-03-13 NOTE — ED Triage Notes (Signed)
 Frequent urination, fatigue, and dry mouth started last Friday.  Initially thought to be a uti by the patient.  Patient saw pcp and told urine negative.    Patient reports no other testing was done to determine a diagnosis for patient

## 2024-03-13 NOTE — ED Triage Notes (Signed)
 Pt coming in from UC where she was found to have a blood sugar over 500. Pt reports frequent urination, having a bad taste in her mouth , dry mouth.

## 2024-03-13 NOTE — ED Notes (Signed)
 Dr. Emil EDP notified on hyperglycemia.

## 2024-03-14 ENCOUNTER — Encounter (HOSPITAL_COMMUNITY): Payer: Self-pay | Admitting: Internal Medicine

## 2024-03-14 DIAGNOSIS — E119 Type 2 diabetes mellitus without complications: Secondary | ICD-10-CM | POA: Diagnosis not present

## 2024-03-14 DIAGNOSIS — E785 Hyperlipidemia, unspecified: Secondary | ICD-10-CM

## 2024-03-14 DIAGNOSIS — E66813 Obesity, class 3: Secondary | ICD-10-CM

## 2024-03-14 DIAGNOSIS — G4733 Obstructive sleep apnea (adult) (pediatric): Secondary | ICD-10-CM | POA: Diagnosis not present

## 2024-03-14 DIAGNOSIS — I10 Essential (primary) hypertension: Secondary | ICD-10-CM | POA: Diagnosis not present

## 2024-03-14 DIAGNOSIS — M059 Rheumatoid arthritis with rheumatoid factor, unspecified: Secondary | ICD-10-CM

## 2024-03-14 LAB — BETA-HYDROXYBUTYRIC ACID: Beta-Hydroxybutyric Acid: 0.91 mmol/L — ABNORMAL HIGH (ref 0.05–0.27)

## 2024-03-14 LAB — COMPREHENSIVE METABOLIC PANEL WITH GFR
ALT: 29 U/L (ref 0–44)
AST: 20 U/L (ref 15–41)
Albumin: 3.8 g/dL (ref 3.5–5.0)
Alkaline Phosphatase: 92 U/L (ref 38–126)
Anion gap: 17 — ABNORMAL HIGH (ref 5–15)
BUN: 22 mg/dL (ref 8–23)
CO2: 21 mmol/L — ABNORMAL LOW (ref 22–32)
Calcium: 9.1 mg/dL (ref 8.9–10.3)
Chloride: 102 mmol/L (ref 98–111)
Creatinine, Ser: 0.95 mg/dL (ref 0.44–1.00)
GFR, Estimated: >60 mL/min (ref >60)
Glucose, Bld: 409 mg/dL — ABNORMAL HIGH (ref 70–99)
Potassium: 4 mmol/L (ref 3.5–5.1)
Sodium: 140 mmol/L (ref 135–145)
Total Bilirubin: 1.5 mg/dL — ABNORMAL HIGH (ref 0.0–1.2)
Total Protein: 7.8 g/dL (ref 6.5–8.1)

## 2024-03-14 LAB — BASIC METABOLIC PANEL WITH GFR
Anion gap: 11 (ref 5–15)
BUN: 23 mg/dL (ref 8–23)
CO2: 26 mmol/L (ref 22–32)
Calcium: 9.6 mg/dL (ref 8.9–10.3)
Chloride: 104 mmol/L (ref 98–111)
Creatinine, Ser: 0.81 mg/dL (ref 0.44–1.00)
GFR, Estimated: >60 mL/min (ref >60)
Glucose, Bld: 246 mg/dL — ABNORMAL HIGH (ref 70–99)
Potassium: 3.6 mmol/L (ref 3.5–5.1)
Sodium: 141 mmol/L (ref 135–145)

## 2024-03-14 LAB — CBC
HCT: 44.2 % (ref 36.0–46.0)
Hemoglobin: 14.3 g/dL (ref 12.0–15.0)
MCH: 28.1 pg (ref 26.0–34.0)
MCHC: 32.4 g/dL (ref 30.0–36.0)
MCV: 86.8 fL (ref 80.0–100.0)
Platelets: 308 K/uL (ref 150–400)
RBC: 5.09 MIL/uL (ref 3.87–5.11)
RDW: 12.6 % (ref 11.5–15.5)
WBC: 6.8 K/uL (ref 4.0–10.5)
nRBC: 0 % (ref 0.0–0.2)

## 2024-03-14 LAB — CBG MONITORING, ED
Glucose-Capillary: 514 mg/dL (ref 70–99)
Glucose-Capillary: 428 mg/dL — ABNORMAL HIGH (ref 70–99)
Glucose-Capillary: 323 mg/dL — ABNORMAL HIGH (ref 70–99)
Glucose-Capillary: 273 mg/dL — ABNORMAL HIGH (ref 70–99)

## 2024-03-14 LAB — GLUCOSE, CAPILLARY
Glucose-Capillary: 239 mg/dL — ABNORMAL HIGH (ref 70–99)
Glucose-Capillary: 220 mg/dL — ABNORMAL HIGH (ref 70–99)
Glucose-Capillary: 297 mg/dL — ABNORMAL HIGH (ref 70–99)

## 2024-03-14 LAB — HIV ANTIBODY (ROUTINE TESTING W REFLEX): HIV Screen 4th Generation wRfx: NONREACTIVE

## 2024-03-14 MED ORDER — ACETAMINOPHEN 325 MG PO TABS: 650.0000 mg | ORAL_TABLET | Freq: Four times a day (QID) | ORAL | Status: DC | PRN

## 2024-03-14 MED ORDER — POLYETHYLENE GLYCOL 3350 17 G PO PACK: 17.0000 g | PACK | Freq: Every day | ORAL | Status: DC | PRN

## 2024-03-14 MED ORDER — HYDROCHLOROTHIAZIDE 12.5 MG PO TABS
12.5000 mg | ORAL_TABLET | Freq: Every day | ORAL | Status: DC
  Administered 2024-03-14 – 2024-03-15 (×2): 12.5 mg via ORAL
  Filled 2024-03-14 (×2): qty 1

## 2024-03-14 MED ORDER — PNEUMOCOCCAL 20-VAL CONJ VACC 0.5 ML IM SUSY
0.5000 mL | PREFILLED_SYRINGE | INTRAMUSCULAR | Status: DC
  Filled 2024-03-14: qty 0.5

## 2024-03-14 MED ORDER — ENSURE PLUS HIGH PROTEIN PO LIQD
237.0000 mL | Freq: Two times a day (BID) | ORAL | Status: DC
  Administered 2024-03-15: 237 mL via ORAL

## 2024-03-14 MED ORDER — AMLODIPINE BESYLATE 5 MG PO TABS
5.0000 mg | ORAL_TABLET | Freq: Every day | ORAL | Status: DC
  Administered 2024-03-14 – 2024-03-15 (×2): 5 mg via ORAL
  Filled 2024-03-14 (×2): qty 1

## 2024-03-14 MED ORDER — ATORVASTATIN CALCIUM 40 MG PO TABS
40.0000 mg | ORAL_TABLET | Freq: Every day | ORAL | Status: DC
  Administered 2024-03-14 – 2024-03-15 (×2): 40 mg via ORAL
  Filled 2024-03-14 (×2): qty 1

## 2024-03-14 MED ORDER — INFLUENZA VIRUS VACC SPLIT PF (FLUZONE) 0.5 ML IM SUSY
0.5000 mL | PREFILLED_SYRINGE | INTRAMUSCULAR | Status: AC
  Administered 2024-03-15: 0.5 mL via INTRAMUSCULAR
  Filled 2024-03-14: qty 0.5

## 2024-03-14 MED ORDER — INSULIN ASPART 100 UNIT/ML IV SOLN
10.0000 [IU] | Freq: Once | INTRAVENOUS | Status: AC
  Administered 2024-03-14: 10 [IU] via INTRAVENOUS

## 2024-03-14 MED ORDER — LACTATED RINGERS IV BOLUS
1000.0000 mL | Freq: Once | INTRAVENOUS | Status: AC
  Administered 2024-03-14: 1000 mL via INTRAVENOUS

## 2024-03-14 MED ORDER — ONDANSETRON HCL 4 MG/2ML IJ SOLN: 4.0000 mg | Freq: Four times a day (QID) | INTRAMUSCULAR | Status: DC | PRN

## 2024-03-14 MED ORDER — LIVING WELL WITH DIABETES BOOK
Freq: Once | Status: AC
  Filled 2024-03-14: qty 1

## 2024-03-14 MED ORDER — WITCH HAZEL-GLYCERIN EX PADS: MEDICATED_PAD | CUTANEOUS | Status: DC | PRN

## 2024-03-14 MED ORDER — INSULIN STARTER KIT- PEN NEEDLES (ENGLISH)
1.0000 | Freq: Once | Status: AC
  Administered 2024-03-14: 1
  Filled 2024-03-14: qty 1

## 2024-03-14 MED ORDER — IRBESARTAN 75 MG PO TABS
150.0000 mg | ORAL_TABLET | Freq: Every day | ORAL | Status: DC
  Administered 2024-03-14 – 2024-03-15 (×2): 150 mg via ORAL
  Filled 2024-03-14: qty 1
  Filled 2024-03-14: qty 2

## 2024-03-14 MED ORDER — INSULIN ASPART 100 UNIT/ML IJ SOLN
10.0000 [IU] | Freq: Once | INTRAMUSCULAR | Status: AC
  Administered 2024-03-14: 10 [IU] via SUBCUTANEOUS

## 2024-03-14 MED ORDER — INSULIN ASPART 100 UNIT/ML IJ SOLN
0.0000 [IU] | Freq: Three times a day (TID) | INTRAMUSCULAR | Status: DC
  Administered 2024-03-14: 8 [IU] via SUBCUTANEOUS
  Administered 2024-03-14: 5 [IU] via SUBCUTANEOUS
  Administered 2024-03-15: 11 [IU] via SUBCUTANEOUS
  Administered 2024-03-15: 15 [IU] via SUBCUTANEOUS

## 2024-03-14 MED ORDER — ACETAMINOPHEN 650 MG RE SUPP: 650.0000 mg | Freq: Four times a day (QID) | RECTAL | Status: DC | PRN

## 2024-03-14 MED ORDER — INSULIN ASPART 100 UNIT/ML IJ SOLN
15.0000 [IU] | Freq: Once | INTRAMUSCULAR | Status: AC
  Administered 2024-03-14: 15 [IU] via SUBCUTANEOUS

## 2024-03-14 MED ORDER — SODIUM CHLORIDE 0.9 % IV BOLUS
1000.0000 mL | Freq: Once | INTRAVENOUS | Status: AC
Start: 2024-03-14 — End: 2024-03-14
  Administered 2024-03-14: 1000 mL via INTRAVENOUS

## 2024-03-14 MED ORDER — ORAL CARE MOUTH RINSE: 15.0000 mL | OROMUCOSAL | Status: DC | PRN

## 2024-03-14 MED ORDER — ONDANSETRON HCL 4 MG PO TABS: 4.0000 mg | ORAL_TABLET | Freq: Four times a day (QID) | ORAL | Status: DC | PRN

## 2024-03-14 MED ORDER — VALSARTAN-HYDROCHLOROTHIAZIDE 160-12.5 MG PO TABS: 1.0000 | ORAL_TABLET | Freq: Every day | ORAL | Status: DC

## 2024-03-14 MED ORDER — ENOXAPARIN SODIUM 40 MG/0.4ML IJ SOSY
40.0000 mg | PREFILLED_SYRINGE | INTRAMUSCULAR | Status: DC
  Administered 2024-03-14 – 2024-03-15 (×2): 40 mg via SUBCUTANEOUS
  Filled 2024-03-14 (×2): qty 0.4

## 2024-03-14 MED ORDER — INSULIN ASPART 100 UNIT/ML IJ SOLN
0.0000 [IU] | Freq: Every day | INTRAMUSCULAR | Status: DC
  Administered 2024-03-14: 3 [IU] via SUBCUTANEOUS

## 2024-03-14 MED ORDER — CALCIUM CARBONATE ANTACID 500 MG PO CHEW: 1.0000 | CHEWABLE_TABLET | Freq: Every day | ORAL | Status: DC | PRN

## 2024-03-14 MED ORDER — INSULIN GLARGINE 100 UNIT/ML ~~LOC~~ SOLN
10.0000 [IU] | Freq: Two times a day (BID) | SUBCUTANEOUS | Status: DC
  Administered 2024-03-14: 10 [IU] via SUBCUTANEOUS
  Filled 2024-03-14 (×3): qty 0.1

## 2024-03-14 NOTE — ED Notes (Signed)
 Floor notified patient coming up

## 2024-03-14 NOTE — Assessment & Plan Note (Signed)
 Continue home atorvastatin

## 2024-03-14 NOTE — Assessment & Plan Note (Signed)
 Estimated body mass index is 42.25 kg/m as calculated from the following:   Height as of 03/11/24: 5' 5.5 (1.664 m).   Weight as of 03/11/24: 116.9 kg.   - This will complicate overall prognosis -Encouraged weight loss with exercise and healthy diet

## 2024-03-14 NOTE — Assessment & Plan Note (Signed)
-   Continue with CPAP at night 

## 2024-03-14 NOTE — Inpatient Diabetes Management (Addendum)
 Inpatient Diabetes Program Recommendations  AACE/ADA: New Consensus Statement on Inpatient Glycemic Control (2015)  Target Ranges:  Prepandial:   less than 140 mg/dL      Peak postprandial:   less than 180 mg/dL (1-2 hours)      Critically ill patients:  140 - 180 mg/dL    Latest Reference Range & Units 03/13/24 22:16  Beta-Hydroxybutyric Acid 0.05 - 0.27 mmol/L 1.11 (H)  (H): Data is abnormally high  Latest Reference Range & Units 05/16/23 08:51 03/13/24 22:16  Hemoglobin A1C 4.8 - 5.6 % 6.5 (H) 10.1 (H)  243 mg/dl  (H): Data is abnormally high  Latest Reference Range & Units 03/14/24 02:46 03/14/24 05:30  Glucose-Capillary 70 - 99 mg/dL 485 (HH)   571 (H)    Latest Reference Range & Units 03/14/24 02:46 03/14/24 05:30 03/14/24 10:10  Glucose-Capillary 70 - 99 mg/dL 485 (HH)  10 units Novolog  @0348  +   1L NS IVF 428 (H)  10 units Novolog  @0834  323 (H)  15 units Novolog   1L LR IVF    To ED from UC with Hyperglycemia  New Diagnosis Diabetes  Current Orders: Novolog  Moderate Correction Scale/ SSI (0-15 units) TID AC + HS    Current A1c shows new diagnosis Diabetes= 10.1% Likely needs insulin  for home BHB level/ BMET ordered   Met w/ pt down in the ED--Dtr Joya also present at bedside.  Pt endorses severe hyperglycemia symptoms for days (thirst, urination, fatigue).  Family hx of Diabetes.  Pt also has Hx of RA.  Told me she gets labs checked at RA appts.  Has had A1c checked in the past.  Sees Dr. Sagardia for Primary care--has never been told she had high sugar or pre-diabetes.  I reviewed pt's last A1c in Nov 2024 (when it was 6.5%) and told her technically this was a positive diagnosis of diabetes back in 2024.  Spoke with pt about new diagnosis.  Discussed A1C results with pt and her Dtr and explained what an A1C is, basic pathophysiology of DM Type 2, basic home care, basic diabetes diet nutrition principles, importance of checking CBGs and maintaining good  CBG control to prevent long-term and short-term complications.  Reviewed signs and symptoms of hyperglycemia and hypoglycemia and how to treat hypoglycemia at home.  Also reviewed blood sugar goals and A1c goals for home.  Asked pt to check her CBGs TID AC (and occasionally two-hrs after meals) and to keep a record of them.  Discussed CGM for the future.  Explained what CGM is, how it differs from traditional fingerstick CBG, etc.  Asked pt to please talk with PCP after d/c for possible start of CGM at home.  Also discussed DM diet information with patient.  Encouraged patient to avoid beverages with sugar (regular soda, sweet tea, lemonade, fruit juice) and to consume mostly water.  Discussed what foods contain carbohydrates and how carbohydrates affect the body's blood sugar levels.  Encouraged patient to be careful with her portion sizes (especially grains, starchy vegetables, and fruits).   RNs to provide ongoing basic DM education at bedside with this patient.  Have ordered educational booklet, insulin  starter kit.  Have also placed RD consult for DM diet education for this patient.  Educated patient and Dtr on insulin  pen use at home.  Reviewed all steps of insulin  pen including attachment of needle, 2-unit air shot, dialing up dose, giving injection, rotation of injection sites, removing needle, disposal of sharps, storage of unused insulin , disposal of insulin   etc.  Patient able to provide successful return demonstration.  Reviewed troubleshooting with insulin  pen.  Also reviewed Signs/Symptoms of Hypoglycemia with patient and how to treat Hypoglycemia at home.  Have asked RNs caring for patient to please allow patient to give all injections here in hospital as much as possible for practice.        --Will follow patient during hospitalization--  Adina Rudolpho Arrow RN, MSN, CDCES Diabetes Coordinator Inpatient Glycemic Control Team Team Pager: [phone] (8a-5p)

## 2024-03-14 NOTE — Plan of Care (Signed)
 Laurie Hodge arrived to the unit via wheelchair with no issues. She is pleasant and feels well equip to control her new diagnosis. Her family has been bedside, since her arrival to the unit and is active and appropriate with education regarding her care. She is alert and oriented, ambulates independently, is able to feed herself and poses no increased risk for infection. She received the diabetes information sent by pharmacy and states that she will ask any questions that comes to mind. He is overall well appearing and only endorses a decrease in all T2DM symptoms that brought her to the ED: polyuria with urgency and increased thirst.

## 2024-03-14 NOTE — Assessment & Plan Note (Signed)
 Patient with new diagnosis of diabetes mellitus, likely type II, came with significant hyperglycemia and A1c of 10.1. Patient had mild anion gap and ketonuria with mildly elevated betahydroxybutyrate on admission, improved with IV fluid and bolus dose of NovoLog .  Did not required Endo tool. - Consult to diabetes coordinator -Starting on Semglee  10 units twice daily -Moderate SSI -Continue to monitor

## 2024-03-14 NOTE — ED Notes (Signed)
 PT verbalized chest heaviness, indigestion. Amin,MD notified. EKG done and ED provider notified. Awaiting response.

## 2024-03-14 NOTE — Assessment & Plan Note (Signed)
 Blood pressure within upper goal. - Continue home amlodipine  and Diovan  -Continue to monitor

## 2024-03-14 NOTE — ED Notes (Signed)
 CCMD called.

## 2024-03-14 NOTE — Assessment & Plan Note (Signed)
 Patient has an history of rheumatoid arthritis, followed up with rheumatology as outpatient. Currently started on Plaquenil but she has not started yet due to concern of GI side effects. - Continue outpatient follow-up

## 2024-03-14 NOTE — H&P (Signed)
 History and Physical    Patient: Laurie Hodge FMW:[id number] DOB: 02/15/1961 DOA: 03/13/2024 DOS: the patient was seen and examined on 03/14/2024 PCP: Purcell Emil Schanz, MD  Patient coming from: Home  Chief Complaint:  Chief Complaint  Patient presents with   Hyperglycemia   HPI: Laurie STROHMAN is a 63 y.o. female with medical history significant of hypertension, morbid obesity and rheumatoid arthritis came to ED with concern of worsening fatigue, polyuria and polydipsia.  Patient saw PCP few days ago and was initially thought it is UTI so she was given antibiotics, urine cultures were drawn and they came back negative.  Patient denies any other recent illnesses.  No pain.  No other urinary symptoms except increased urinary frequency.  Worsening fatigue and decreased appetite as she was keep drinking fluid which also include juices and soda.  On presentation patient was found to have mildly elevated blood pressure, otherwise stable vitals.  Labs with significant hyperglycemia with blood glucose of 559, creatinine of 1.16, initial anion gap of 19, normal CO2.  Patient was given IV fluid boluses and NovoLog .  Review of Systems: As mentioned in the history of present illness. All other systems reviewed and are negative. Past Medical History:  Diagnosis Date   Allergy    Anemia    Arthritis    RA   Depression    External hemorrhoids    GERD (gastroesophageal reflux disease)    H. pylori infection    many years ago   Hemorrhoid    Hiatal hernia    HSV-2 infection    IgG   HTN (hypertension)    Hyperlipidemia    Hyperplastic colon polyp    Iron  (Fe) deficiency anemia    Obesity, Class III, BMI 40-49.9 (morbid obesity)    OSA on CPAP    cpap broke this week   Rheumatoid arthritis (HCC)    Sleep apnea    do not use her cipap   Ulcer    gastric - h. pylori positive   Vitamin D  insufficiency    Past Surgical History:  Procedure Laterality Date   LAPAROSCOPIC GASTRIC  SLEEVE RESECTION N/A 05/31/2015   Procedure: LAPAROSCOPIC GASTRIC SLEEVE RESECTION;  Surgeon: Herlene Beverley Bureau, MD;  Location: [facility];  Service: General;  Laterality: N/A;   TUBAL LIGATION     Social History:  reports that she has never smoked. She has never used smokeless tobacco. She reports current alcohol use. She reports that she does not use drugs.  Allergies  Allergen Reactions   Adhesive [Tape] Rash    Skin Peels    Family History  Problem Relation Age of Onset   Diabetes Mother    Hypertension Mother    Dementia Mother    Stroke Mother 78   Hyperlipidemia Mother    Depression Mother    Anxiety disorder Mother    Obesity Mother    Heart disease Father    Hyperlipidemia Father    Hypertension Father    Stroke Father    Depression Father    Sleep apnea Father    Obesity Father    Diabetes Sister    Heart disease Sister    Hyperlipidemia Sister    Hypertension Sister    Heart disease Sister    Hyperlipidemia Sister    Hypertension Sister    Diabetes Brother    Hyperlipidemia Brother    Hypertension Brother    Hypertension Son    Cancer Paternal Aunt  breast ca x 5 maternal aunts   Colon cancer Neg Hx    Esophageal cancer Neg Hx    Rectal cancer Neg Hx    Stomach cancer Neg Hx     Prior to Admission medications   Medication Sig Start Date End Date Taking? Authorizing Provider  amLODipine  (NORVASC ) 5 MG tablet Take 1 tablet by mouth once daily 02/03/24  Yes Sagardia, Miguel Jose, MD  atorvastatin  (LIPITOR) 40 MG tablet Take 1 tablet by mouth once daily 02/21/24  Yes Sagardia, Miguel Jose, MD  Biotin 5 MG CAPS Take 15 mg by mouth daily.   Yes [provider]  cefUROXime  (CEFTIN ) 250 MG tablet Take 1 tablet (250 mg total) by mouth 2 (two) times daily with a meal for 7 days. 03/11/24 03/18/24 Yes Sagardia, Miguel Jose, MD  Cholecalciferol (VITAMIN D ) 50 MCG (2000 UT) tablet Pt will increase to 4000 one day and 6,000 the next. (  change to 5,000 IU  daily) 05/30/23  Yes Opalski, Barnie, DO  ENBREL SURECLICK 50 MG/ML injection Inject 50 mg into the skin once a week. 06/22/23  Yes [provider]  FERREX 150 150 MG capsule Take 1 capsule by mouth twice daily 02/21/24  Yes Sagardia, Jackson, MD  Fish Oil-Krill Oil (KRILL OIL PLUS) CAPS Take 750 mg by mouth daily.   Yes [provider]  meloxicam  (MOBIC ) 15 MG tablet Take 15 mg by mouth daily as needed.   Yes [provider]  valsartan -hydrochlorothiazide  (DIOVAN -HCT) 160-12.5 MG tablet Take 1 tablet by mouth once daily 04/11/23  Yes Sagardia, Miguel Jose, MD  hydroxychloroquine (PLAQUENIL) 200 MG tablet Take 200 mg by mouth 2 (two) times daily. NOT TAKING THIS Patient not taking: Reported on 03/14/2024 02/27/24   [provider]  ketoconazole  (NIZORAL ) 2 % cream Apply 1 Application topically daily. Patient not taking: Reported on 03/14/2024 08/01/23   Purcell Emil Schanz, MD    Physical Exam: Vitals:   03/14/24 1210 03/14/24 1215 03/14/24 1423 03/14/24 1442  BP: (!) 152/67 (!) 142/72 124/63 (!) 136/55  Pulse: (S) (!) 44 (!) 53 72 70  Resp: 15 14 (!) 23 19  Temp: (!) 97.4 F (36.3 C)  (!) 97.4 F (36.3 C) 97.9 F (36.6 C)  TempSrc: Axillary  Axillary Oral  SpO2: 100% 100% 100% 95%   General: Vital signs reviewed.  Morbidly obese lady, in no acute distress and cooperative with exam.  Head: Normocephalic and atraumatic. Eyes: EOMI, conjunctivae normal, no scleral icterus.  Neck: Supple, trachea midline, normal ROM, no JVD, masses, thyromegaly, or carotid bruit present.  Cardiovascular: RRR, S1 normal, S2 normal, no murmurs, gallops, or rubs. Pulmonary/Chest: Clear to auscultation bilaterally, no wheezes, rales, or rhonchi. Abdominal: Soft, non-tender, non-distended, BS +,  Extremities: No lower extremity edema bilaterally,  pulses symmetric and intact bilaterally. No cyanosis or clubbing. Neurological: A&O x3, Strength is normal and symmetric  bilaterally, cranial nerve II-XII are grossly intact, no focal motor deficit, sensory intact to light touch bilaterally.  Skin: Warm, dry and intact. No rashes or erythema. Psychiatric: Normal mood and affect.   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 Patient had mild anion gap and ketonuria with mildly elevated betahydroxybutyrate on admission, improved with IV fluid and bolus dose of NovoLog .  Did not required Endo tool. - Consult to diabetes  coordinator -Starting on Semglee  10 units twice daily -Moderate SSI -Continue to monitor  Obesity, Class III, BMI 40-49.9 (morbid obesity) Estimated body mass index is 42.25 kg/m as calculated from the following:   Height as of 03/11/24: 5' 5.5 (1.664 m).   Weight as of 03/11/24: 116.9 kg.   - This will complicate overall prognosis -Encouraged weight loss with exercise and healthy diet  Hyperlipidemia - Continue home atorvastatin   Rheumatoid arthritis (HCC) Patient has an history of rheumatoid arthritis, followed up with rheumatology as outpatient. Currently started on Plaquenil but she has not started yet due to concern of GI side effects. - Continue outpatient follow-up  HTN (hypertension) Blood pressure within upper goal. - Continue home amlodipine  and Diovan  -Continue to monitor  OSA on CPAP - Continue with CPAP at night    Advance Care Planning:   Code Status: Full Code   Consults: Diabetes coordinator  Family Communication: Discussed with patient  Severity of Illness: The appropriate patient status for this patient is OBSERVATION. Observation status is judged to be reasonable and necessary in order to provide the required intensity of service to ensure the patient's safety. The patient's presenting symptoms, physical exam findings, and  initial radiographic and laboratory data in the context of their medical condition is felt to place them at decreased risk for further clinical deterioration. Furthermore, it is anticipated that the patient will be medically stable for discharge from the hospital within 2 midnights of admission.   This record has been created using Conservation officer, historic buildings. Errors have been sought and corrected,but may not always be located. Such creation errors do not reflect on the standard of care.   Author: Amaryllis Dare, MD 03/14/2024 5:11 PM  For on call review [email or web address].

## 2024-03-15 ENCOUNTER — Other Ambulatory Visit (HOSPITAL_COMMUNITY): Payer: Self-pay

## 2024-03-15 DIAGNOSIS — I10 Essential (primary) hypertension: Secondary | ICD-10-CM | POA: Diagnosis not present

## 2024-03-15 DIAGNOSIS — G4733 Obstructive sleep apnea (adult) (pediatric): Secondary | ICD-10-CM | POA: Diagnosis not present

## 2024-03-15 DIAGNOSIS — E785 Hyperlipidemia, unspecified: Secondary | ICD-10-CM | POA: Diagnosis not present

## 2024-03-15 DIAGNOSIS — E119 Type 2 diabetes mellitus without complications: Secondary | ICD-10-CM | POA: Diagnosis not present

## 2024-03-15 LAB — BASIC METABOLIC PANEL WITH GFR
Anion gap: 12 (ref 5–15)
BUN: 20 mg/dL (ref 8–23)
CO2: 25 mmol/L (ref 22–32)
Calcium: 8.7 mg/dL — ABNORMAL LOW (ref 8.9–10.3)
Chloride: 95 mmol/L — ABNORMAL LOW (ref 98–111)
Creatinine, Ser: 0.78 mg/dL (ref 0.44–1.00)
GFR, Estimated: >60 mL/min (ref >60)
Glucose, Bld: 336 mg/dL — ABNORMAL HIGH (ref 70–99)
Potassium: 4.1 mmol/L (ref 3.5–5.1)
Sodium: 137 mmol/L (ref 135–145)

## 2024-03-15 LAB — GLUCOSE, CAPILLARY
Glucose-Capillary: 327 mg/dL — ABNORMAL HIGH (ref 70–99)
Glucose-Capillary: 398 mg/dL — ABNORMAL HIGH (ref 70–99)

## 2024-03-15 MED ORDER — SITAGLIPTIN PHOSPHATE 100 MG PO TABS
100.0000 mg | ORAL_TABLET | Freq: Every day | ORAL | 1 refills | Status: AC
  Filled 2024-03-15: qty 30, 30d supply, fill #0

## 2024-03-15 MED ORDER — JANUMET 50-1000 MG PO TABS
1.0000 | ORAL_TABLET | Freq: Two times a day (BID) | ORAL | 2 refills | Status: DC
  Filled 2024-03-15: qty 60, 30d supply, fill #0

## 2024-03-15 MED ORDER — PEN NEEDLES 32G X 4 MM MISC
2 refills | Status: AC
  Filled 2024-03-15: qty 100, 30d supply, fill #0

## 2024-03-15 MED ORDER — BLOOD GLUCOSE TEST VI STRP
1.0000 | ORAL_STRIP | Freq: Three times a day (TID) | 0 refills | Status: AC
  Filled 2024-03-15: qty 100, 30d supply, fill #0

## 2024-03-15 MED ORDER — BLOOD GLUCOSE MONITOR SYSTEM W/DEVICE KIT
1.0000 | PACK | Freq: Three times a day (TID) | 0 refills | Status: AC
  Filled 2024-03-15: qty 1, 30d supply, fill #0

## 2024-03-15 MED ORDER — INSULIN GLARGINE 100 UNIT/ML SOLOSTAR PEN
15.0000 [IU] | PEN_INJECTOR | Freq: Two times a day (BID) | SUBCUTANEOUS | 11 refills | Status: AC
  Filled 2024-03-15: qty 15, 50d supply, fill #0

## 2024-03-15 MED ORDER — METFORMIN HCL ER 500 MG PO TB24
1000.0000 mg | ORAL_TABLET | Freq: Every day | ORAL | 1 refills | Status: AC
  Filled 2024-03-15: qty 180, 90d supply, fill #0

## 2024-03-15 MED ORDER — LANCET DEVICE MISC
1.0000 | Freq: Three times a day (TID) | 0 refills | Status: AC
  Filled 2024-03-15: qty 1, 30d supply, fill #0

## 2024-03-15 MED ORDER — INSULIN GLARGINE 100 UNIT/ML ~~LOC~~ SOLN
15.0000 [IU] | Freq: Two times a day (BID) | SUBCUTANEOUS | Status: DC
  Administered 2024-03-15: 15 [IU] via SUBCUTANEOUS
  Filled 2024-03-15 (×2): qty 0.15

## 2024-03-15 MED ORDER — INSULIN ASPART 100 UNIT/ML IJ SOLN
5.0000 [IU] | Freq: Three times a day (TID) | INTRAMUSCULAR | Status: DC
Start: 2024-03-15 — End: 2024-03-15
  Administered 2024-03-15: 5 [IU] via SUBCUTANEOUS

## 2024-03-15 MED ORDER — ACCU-CHEK SOFTCLIX LANCETS MISC
1.0000 | Freq: Three times a day (TID) | 0 refills | Status: AC
  Filled 2024-03-15: qty 100, 30d supply, fill #0

## 2024-03-15 MED ORDER — INSULIN PEN NEEDLE 32G X 4 MM MISC
0 refills | Status: DC
  Filled 2024-03-15: qty 100, 30d supply, fill #0

## 2024-03-15 NOTE — Discharge Summary (Addendum)
 Physician Discharge Summary   Patient: Laurie Hodge MRN: [id number] DOB: 24-Jul-1960  Admit date:     03/13/2024  Discharge date: 03/15/24  Discharge Physician: Amaryllis Dare   PCP: Purcell Emil Schanz, MD   Recommendations at discharge:  Please obtain CBC and BMP on follow-up Patient with new diagnosis of diabetes and has been discharged on basal insulin  and Janumet.  Need a close follow-up with PCP for further assistance and better control of diabetes. Please encourage weight loss  Discharge Diagnoses: Principal Problem:   Diabetes mellitus, new onset (HCC) Active Problems:   OSA on CPAP   HTN (hypertension)   Rheumatoid arthritis (HCC)   Hyperlipidemia   Obesity, Class III, BMI 40-49.9 (morbid obesity)   Hospital Course: Laurie Hodge is a 63 y.o. female with medical history significant of hypertension, morbid obesity and rheumatoid arthritis came to ED with concern of worsening fatigue, polyuria and polydipsia.   Patient saw PCP few days ago and was initially thought it is UTI so she was given antibiotics, urine cultures were drawn and they came back negative.  On presentation patient was found to have mildly elevated blood pressure, otherwise stable vitals.  Labs with significant hyperglycemia with blood glucose of 559, creatinine of 1.16, initial anion gap of 19, normal CO2.   Patient was given IV fluid boluses and NovoLog .  9/27: Vital stable, patient never required insulin  infusion.  Blood sugar improving but still elevated.  A1c of 10.1.  Patient clinically seems much improved, polyuria and polydipsia improving.  Discussed with patient and she agrees to continue taking insulin .  She has been started on Lantus  15 units twice daily along with Janumet.  As there was no Janumet available she was given separate prescriptions for metformin and Januvia.  Patient was also given glucometer and education was provided how to inject insulin .  Patient needed close follow-up with PCP  for further assistance and better control of her diabetes.  She will continue on current medications and follow-up with her providers for further assistance.  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 Patient had mild anion gap and ketonuria with mildly elevated betahydroxybutyrate on admission, improved with IV fluid and bolus dose of NovoLog .  Did not required Endo tool. Still having hyperglycemia but much improved than before.  Symptoms started improving.  Diabetes coordinator discussed that dietary restrictions and how to inject insulin . Being discharged on Lantus  15 mg twice daily and Janumet Will need a close PCP follow-up  Obesity, Class III, BMI 40-49.9 (morbid obesity) Estimated body mass index is 42.25 kg/m as calculated from the following:   Height as of 03/11/24: 5' 5.5 (1.664 m).   Weight as of 03/11/24: 116.9 kg.   - This will complicate overall prognosis -Encouraged weight loss with exercise and healthy diet  Hyperlipidemia - Continue home atorvastatin   Rheumatoid arthritis [facility] Patient has an history of rheumatoid arthritis, followed up with rheumatology as outpatient. Currently started on Plaquenil but she has not started yet due to concern of GI side effects. - Continue outpatient follow-up  HTN (hypertension) Blood pressure within upper goal. - Continue home amlodipine  and Diovan  -Continue to monitor  OSA on CPAP - Continue with CPAP at night  Consultants: None Procedures performed: None Disposition: Home Diet recommendation:  Discharge Diet Orders (From admission, onward)     Start     Ordered   03/15/24  0000  Diet - low sodium heart healthy        03/15/24 1036           Cardiac and Carb modified diet DISCHARGE MEDICATION: Allergies as of 03/15/2024       Reactions   Adhesive [tape] Rash   Skin Peels         Medication List     STOP taking these medications    cefUROXime  250 MG tablet Commonly known as: CEFTIN        TAKE these medications    Accu-Chek Softclix Lancets lancets Use as directed to check blood sugar in the morning, at noon, and at bedtime. (1 each in the morning, at noon, and at bedtime. May substitute to any manufacturer covered by patient's insurance.)   amLODipine  5 MG tablet Commonly known as: NORVASC  Take 1 tablet by mouth once daily   atorvastatin  40 MG tablet Commonly known as: LIPITOR Take 1 tablet by mouth once daily   Biotin 5 MG Caps Take 15 mg by mouth daily.   Blood Glucose Monitor System w/Device Kit Use as directed to check blood sugar in the morning, at noon, and at bedtime. (1 each by Does not apply route in the morning, at noon, and at bedtime. May substitute to any manufacturer covered by patient's insurance.)   BLOOD GLUCOSE TEST STRIPS Strp Use to check blood sugar in the morning, at noon, and at bedtime (1 each by Does not apply route in the morning, at noon, and at bedtime. May substitute to any manufacturer covered by patient's insurance.)   Enbrel SureClick 50 MG/ML injection Generic drug: etanercept Inject 50 mg into the skin once a week.   Ferrex 150 150 MG capsule Generic drug: iron  polysaccharides Take 1 capsule by mouth twice daily   hydroxychloroquine 200 MG tablet Commonly known as: PLAQUENIL Take 200 mg by mouth 2 (two) times daily. NOT TAKING THIS   insulin  glargine 100 UNIT/ML Solostar Pen Commonly known as: LANTUS  Inject 15 Units into the skin 2 (two) times daily.   ketoconazole  2 % cream Commonly known as: NIZORAL  Apply 1 Application topically daily.   Krill Oil Plus Caps Take 750 mg by mouth daily.   Lancet Device Misc 1 each by Does not apply route in the morning, at noon, and at bedtime. May substitute to any manufacturer covered by patient's insurance.   meloxicam  15 MG tablet Commonly known as:  MOBIC  Take 15 mg by mouth daily as needed.   metFORMIN 1000 MG (MOD) 24 hr tablet Commonly known as: GLUMETZA Take 1 tablet (1,000 mg total) by mouth daily with breakfast.   Pen Needles 32G X 4 MM Misc To use with your insulin  pen   sitaGLIPtin 100 MG tablet Commonly known as: Januvia Take 1 tablet (100 mg total) by mouth daily.   valsartan -hydrochlorothiazide  160-12.5 MG tablet Commonly known as: DIOVAN -HCT Take 1 tablet by mouth once daily   Vitamin D  50 MCG (2000 UT) tablet Pt will increase to 4000 one day and 6,000 the next. (  change to 5,000 IU daily)         Follow-up Information     Purcell Emil Schanz, MD. Schedule an appointment as soon as possible for a visit in 1 week(s).   Specialty: Internal Medicine Contact information: [address] [place] [address] [phone]                Discharge Exam: Filed Weights   03/14/24 1442  Weight: 115.1 kg  General.  Morbidly obese lady, in no acute distress. Pulmonary.  Lungs clear bilaterally, normal respiratory effort. CV.  Regular rate and rhythm, no JVD, rub or murmur. Abdomen.  Soft, nontender, nondistended, BS positive. CNS.  Alert and oriented .  No focal neurologic deficit. Extremities.  No edema, no cyanosis, pulses intact and symmetrical. Psychiatry.  Judgment and insight appears normal.   Condition at discharge: stable  The results of significant diagnostics from this hospitalization (including imaging, microbiology, ancillary and laboratory) are listed below for reference.   Imaging Studies: No results found.  Microbiology: Results for orders placed or performed in visit on 03/11/24  Urine Culture     Status: None   Collection Time: 03/11/24  3:43 PM   Specimen: Urine  Result Value Ref Range Status   Source: URINE  Final   Status: FINAL  Final   Result: No Growth  Final    Labs: CBC: Recent Labs  Lab 03/13/24 2216 03/13/24 2222 03/14/24 0848  WBC 9.3  --  6.8   NEUTROABS 5.3  --   --   HGB 15.1* 16.0* 14.3  HCT 46.1* 47.0* 44.2  MCV 86.2  --  86.8  PLT 377  --  308   Basic Metabolic Panel: Recent Labs  Lab 03/13/24 2216 03/13/24 2222 03/14/24 0848 03/14/24 1516 03/15/24 0334  NA 136 138 140 141 137  K 4.1 4.2 4.0 3.6 4.1  CL 95*  --  102 104 95*  CO2 22  --  21* 26 25  GLUCOSE 559*  --  409* 246* 336*  BUN 22  --  22 23 20   CREATININE 1.16*  --  0.95 0.81 0.78  CALCIUM  9.9  --  9.1 9.6 8.7*   Liver Function Tests: Recent Labs  Lab 03/14/24 0848  AST 20  ALT 29  ALKPHOS 92  BILITOT 1.5*  PROT 7.8  ALBUMIN 3.8   CBG: Recent Labs  Lab 03/14/24 1149 03/14/24 1444 03/14/24 1724 03/14/24 2216 03/15/24 0817  GLUCAP 273* 239* 220* 297* 327*    Discharge time spent: greater than 30 minutes.  This record has been created using Conservation officer, historic buildings. Errors have been sought and corrected,but may not always be located. Such creation errors do not reflect on the standard of care.   Signed: Amaryllis Dare, MD [facility] 03/15/2024

## 2024-03-15 NOTE — Hospital Course (Addendum)
 Laurie Hodge is a 63 y.o. female with medical history significant of hypertension, morbid obesity and rheumatoid arthritis came to ED with concern of worsening fatigue, polyuria and polydipsia.   Patient saw PCP few days ago and was initially thought it is UTI so she was given antibiotics, urine cultures were drawn and they came back negative.  On presentation patient was found to have mildly elevated blood pressure, otherwise stable vitals.  Labs with significant hyperglycemia with blood glucose of 559, creatinine of 1.16, initial anion gap of 19, normal CO2.   Patient was given IV fluid boluses and NovoLog .  9/27: Vital stable, patient never required insulin  infusion.  Blood sugar improving but still elevated.  A1c of 10.1.  Patient clinically seems much improved, polyuria and polydipsia improving.  Discussed with patient and she agrees to continue taking insulin .  She has been started on Lantus  15 units twice daily along with Janumet.  As there was no Janumet available she was given separate prescriptions for metformin and Januvia.  Patient was also given glucometer and education was provided how to inject insulin .  Patient needed close follow-up with PCP for further assistance and better control of her diabetes.  She will continue on current medications and follow-up with her providers for further assistance.

## 2024-03-17 ENCOUNTER — Encounter: Payer: Self-pay | Admitting: Emergency Medicine

## 2024-03-17 ENCOUNTER — Ambulatory Visit: Admitting: Emergency Medicine

## 2024-03-17 VITALS — BP 110/70 | HR 70 | Temp 96.9°F | Ht 65.0 in | Wt 255.8 lb

## 2024-03-17 DIAGNOSIS — M059 Rheumatoid arthritis with rheumatoid factor, unspecified: Secondary | ICD-10-CM

## 2024-03-17 DIAGNOSIS — I152 Hypertension secondary to endocrine disorders: Secondary | ICD-10-CM

## 2024-03-17 DIAGNOSIS — Z6841 Body Mass Index (BMI) 40.0 and over, adult: Secondary | ICD-10-CM

## 2024-03-17 DIAGNOSIS — G4733 Obstructive sleep apnea (adult) (pediatric): Secondary | ICD-10-CM

## 2024-03-17 DIAGNOSIS — E119 Type 2 diabetes mellitus without complications: Secondary | ICD-10-CM

## 2024-03-17 DIAGNOSIS — Z794 Long term (current) use of insulin: Secondary | ICD-10-CM

## 2024-03-17 DIAGNOSIS — K219 Gastro-esophageal reflux disease without esophagitis: Secondary | ICD-10-CM

## 2024-03-17 DIAGNOSIS — Z09 Encounter for follow-up examination after completed treatment for conditions other than malignant neoplasm: Secondary | ICD-10-CM

## 2024-03-17 DIAGNOSIS — E1159 Type 2 diabetes mellitus with other circulatory complications: Secondary | ICD-10-CM

## 2024-03-17 MED ORDER — FREESTYLE LIBRE 3 SENSOR MISC: 11 refills | Status: AC

## 2024-03-17 MED ORDER — FREESTYLE LIBRE 3 READER DEVI: 0 refills | Status: AC

## 2024-03-17 NOTE — Progress Notes (Signed)
 Laurie Hodge 63 y.o.   Chief Complaint  Patient presents with   Hospitalization Follow-up    HISTORY OF PRESENT ILLNESS: This is a 63 y.o. female here for hospital discharge follow-up Discharge summary as follows: Physician Discharge Summary    Patient: Laurie Hodge MRN: [id number] DOB: [birth year]  Admit date:     03/13/2024  Discharge date: 03/15/24  Discharge Physician: Amaryllis Dare    PCP: Purcell Emil Schanz, MD    Recommendations at discharge:  Please obtain CBC and BMP on follow-up Patient with new diagnosis of diabetes and has been discharged on basal insulin  and Janumet.  Need a close follow-up with PCP for further assistance and better control of diabetes. Please encourage weight loss   Discharge Diagnoses: Principal Problem:   Diabetes mellitus, new onset (HCC) Active Problems:   OSA on CPAP   HTN (hypertension)   Rheumatoid arthritis (HCC)   Hyperlipidemia   Obesity, Class III, BMI 40-49.9 (morbid obesity)     Hospital Course: Laurie Hodge is a 63 y.o. female with medical history significant of hypertension, morbid obesity and rheumatoid arthritis came to ED with concern of worsening fatigue, polyuria and polydipsia.   Patient saw PCP few days ago and was initially thought it is UTI so she was given antibiotics, urine cultures were drawn and they came back negative.   On presentation patient was found to have mildly elevated blood pressure, otherwise stable vitals.  Labs with significant hyperglycemia with blood glucose of 559, creatinine of 1.16, initial anion gap of 19, normal CO2.   Patient was given IV fluid boluses and NovoLog .   9/27: Vital stable, patient never required insulin  infusion.  Blood sugar improving but still elevated.  A1c of 10.1.  Patient clinically seems much improved, polyuria and polydipsia improving.  Discussed with patient and she agrees to continue taking insulin .  She has been started on Lantus  15 units twice daily along with  Janumet.  As there was no Janumet available she was given separate prescriptions for metformin and Januvia.  Patient was also given glucometer and education was provided how to inject insulin .  Patient needed close follow-up with PCP for further assistance and better control of her diabetes.   She will continue on current medications and follow-up with her providers for further assistance.   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 Patient had mild anion gap and ketonuria with mildly elevated betahydroxybutyrate on admission, improved with IV fluid and bolus dose of NovoLog .  Did not required Endo tool. Still having hyperglycemia but much improved than before.  Symptoms started improving.  Diabetes coordinator discussed that dietary restrictions and how to inject insulin . Being discharged on Lantus  15 mg twice daily and Janumet Will need a close PCP follow-up   Obesity, Class III, BMI 40-49.9 (morbid obesity) Estimated body mass index is 42.25 kg/m as calculated from the following:   Height as of 03/11/24: 5' 5.5 (1.664 m).   Weight as of 03/11/24: 116.9 kg.    - This will complicate overall prognosis -Encouraged weight loss with exercise and healthy diet   Hyperlipidemia - Continue home atorvastatin    Rheumatoid arthritis (HCC) Patient has an history of rheumatoid arthritis, followed up with rheumatology as outpatient. Currently started on Plaquenil but she has not started yet due to concern of GI side effects. - Continue outpatient follow-up   HTN (hypertension)  Blood pressure within upper goal. - Continue home amlodipine  and Diovan  -Continue to monitor   OSA on CPAP - Continue with CPAP at night  HPI   Prior to Admission medications   Medication Sig Start Date End Date Taking? Authorizing Provider  Accu-Chek Softclix Lancets lancets 1 each  in the morning, at noon, and at bedtime. May substitute to any manufacturer covered by patient's insurance. 03/15/24 04/14/24 Yes Caleen Qualia, MD  amLODipine  (NORVASC ) 5 MG tablet Take 1 tablet by mouth once daily 02/03/24  Yes Estelle Greenleaf Jose, MD  atorvastatin  (LIPITOR) 40 MG tablet Take 1 tablet by mouth once daily 02/21/24  Yes Alf Doyle Jose, MD  Biotin 5 MG CAPS Take 15 mg by mouth daily.   Yes [provider]  Blood Glucose Monitoring Suppl (BLOOD GLUCOSE MONITOR SYSTEM) w/Device KIT 1 each by Does not apply route in the morning, at noon, and at bedtime. May substitute to any manufacturer covered by patient's insurance. 03/15/24  Yes Amin, Sumayya, MD  Cholecalciferol (VITAMIN D ) 50 MCG (2000 UT) tablet Pt will increase to 4000 one day and 6,000 the next. (  change to 5,000 IU daily) 05/30/23  Yes Opalski, Barnie, DO  ENBREL SURECLICK 50 MG/ML injection Inject 50 mg into the skin once a week. 06/22/23  Yes [provider]  FERREX 150 150 MG capsule Take 1 capsule by mouth twice daily 02/21/24  Yes Azuree Minish, [place], MD  Fish Oil-Krill Oil (KRILL OIL PLUS) CAPS Take 750 mg by mouth daily.   Yes [provider]  Glucose Blood (BLOOD GLUCOSE TEST STRIPS) STRP 1 each by Does not apply route in the morning, at noon, and at bedtime. May substitute to any manufacturer covered by patient's insurance. 03/15/24 04/14/24 Yes Caleen Qualia, MD  insulin  glargine (LANTUS ) 100 UNIT/ML Solostar Pen Inject 15 Units into the skin 2 (two) times daily. 03/15/24  Yes Caleen Qualia, MD  Insulin  Pen Needle (PEN NEEDLES) 32G X 4 MM MISC To use with your insulin  pen 03/15/24  Yes Caleen Qualia, MD  ketoconazole  (NIZORAL ) 2 % cream Apply 1 Application topically daily. Patient taking differently: Apply 1 Application topically daily as needed. 08/01/23  Yes Meilani Edmundson, Emil Schanz, MD  Lancet Device MISC 1 each by Does not apply route in the morning, at noon, and at bedtime. May substitute to  any manufacturer covered by patient's insurance. 03/15/24 04/14/24 Yes Caleen Qualia, MD  meloxicam  (MOBIC ) 15 MG tablet Take 15 mg by mouth daily as needed.   Yes [provider]  metFORMIN (GLUCOPHAGE-XR) 500 MG 24 hr tablet Take 2 tablets (1,000 mg total) by mouth daily with breakfast. 03/15/24  Yes Amin, Sumayya, MD  sitaGLIPtin (JANUVIA) 100 MG tablet Take 1 tablet (100 mg total) by mouth daily. 03/15/24  Yes Caleen Qualia, MD  valsartan -hydrochlorothiazide  (DIOVAN -HCT) 160-12.5 MG tablet Take 1 tablet by mouth once daily 04/11/23  Yes Bailen Geffre Jose, MD  hydroxychloroquine (PLAQUENIL) 200 MG tablet Take 200 mg by mouth 2 (two) times daily. NOT TAKING THIS Patient not taking: Reported on 03/17/2024 02/27/24   [provider]    Allergies  Allergen Reactions   Adhesive [Tape] Rash    Skin Peels    Patient Active Problem List   Diagnosis Date Noted   Diabetes mellitus, new onset (HCC) 03/14/2024   Dysuria 03/11/2024   Acute cystitis without hematuria 03/11/2024   Other fatigue 08/01/2023   Morbid obesity (HCC) 12/19/2022   Tinea versicolor 01/07/2020   Obesity, Class III,  BMI 40-49.9 (morbid obesity) 01/07/2020   Osteoarthritis 02/05/2015   Hyperlipidemia 11/06/2013   Rheumatoid arthritis (HCC) 06/10/2013   BMI 36.0-36.9,adult    GERD (gastroesophageal reflux disease)    OSA on CPAP     Class: Minor   HTN (hypertension)    Vitamin D  insufficiency    Iron  (Fe) deficiency anemia     Past Medical History:  Diagnosis Date   Allergy    Anemia    Arthritis    RA   Depression    External hemorrhoids    GERD (gastroesophageal reflux disease)    H. pylori infection    many years ago   Hemorrhoid    Hiatal hernia    HSV-2 infection    IgG   HTN (hypertension)    Hyperlipidemia    Hyperplastic colon polyp    Iron  (Fe) deficiency anemia    Obesity, Class III, BMI 40-49.9 (morbid obesity)    OSA on CPAP    cpap broke this week   Rheumatoid arthritis  (HCC)    Sleep apnea    do not use her cipap   Ulcer    gastric - h. pylori positive   Vitamin D  insufficiency     Past Surgical History:  Procedure Laterality Date   LAPAROSCOPIC GASTRIC SLEEVE RESECTION N/A 05/31/2015   Procedure: LAPAROSCOPIC GASTRIC SLEEVE RESECTION;  Surgeon: Herlene Righter Kinsinger, MD;  Location: [facility];  Service: General;  Laterality: N/A;   TUBAL LIGATION      Social History   Socioeconomic History   Marital status: Divorced    Spouse name: n/a   Number of children: 3   Years of education: 12   Highest education level: 12th grade  Occupational History   Occupation: TEACHER'S ASSISTANT    Employer: [employer]    Employer: [employer]  Tobacco Use   Smoking status: Never   Smokeless tobacco: Never  Vaping Use   Vaping status: Never Used  Substance and Sexual Activity   Alcohol use: Yes    Alcohol/week: 0.0 standard drinks of alcohol    Comment: 2-3 times a year at special events   Drug use: No   Sexual activity: Not Currently    Partners: Male    Comment: not active since divorce from husband 2010  Other Topics Concern   Not on file  Social History Narrative   Lives alone     Caffeine: hot chocolate a couple times a week   Social Drivers of Corporate investment banker Strain: Low Risk  (03/16/2024)   Overall Financial Resource Strain (CARDIA)    Difficulty of Paying Living Expenses: Not very hard  Food Insecurity: No Food Insecurity (03/16/2024)   Hunger Vital Sign    Worried About Running Out of Food in the Last Year: Never true    Ran Out of Food in the Last Year: Never true  Transportation Needs: No Transportation Needs (03/16/2024)   PRAPARE - Administrator, Civil Service (Medical): No    Lack of Transportation (Non-Medical): No  Physical Activity: Inactive (03/16/2024)   Exercise Vital Sign    Days of Exercise per Week: 0 days    Minutes of Exercise per Session: Not on file  Stress: No Stress  Concern Present (03/16/2024)   [employer] of Occupational Health - Occupational Stress Questionnaire    Feeling of Stress: Only a little  Social Connections: Moderately Integrated (03/16/2024)   Social Connection and Isolation Panel  Frequency of Communication with Friends and Family: Three times a week    Frequency of Social Gatherings with Friends and Family: Twice a week    Attends Religious Services: More than 4 times per year    Active Member of [employer] or Organizations: Yes    Attends Engineer, structural: More than 4 times per year    Marital Status: Divorced  Catering manager Violence: Not At Risk (03/14/2024)   Humiliation, Afraid, Rape, and Kick questionnaire    Fear of Current or Ex-Partner: No    Emotionally Abused: No    Physically Abused: No    Sexually Abused: No    Family History  Problem Relation Age of Onset   Diabetes Mother    Hypertension Mother    Dementia Mother    Stroke Mother 55   Hyperlipidemia Mother    Depression Mother    Anxiety disorder Mother    Obesity Mother    Heart disease Father    Hyperlipidemia Father    Hypertension Father    Stroke Father    Depression Father    Sleep apnea Father    Obesity Father    Diabetes Sister    Heart disease Sister    Hyperlipidemia Sister    Hypertension Sister    Heart disease Sister    Hyperlipidemia Sister    Hypertension Sister    Diabetes Brother    Hyperlipidemia Brother    Hypertension Brother    Hypertension Son    Cancer Paternal Aunt        breast ca x 5 maternal aunts   Colon cancer Neg Hx    Esophageal cancer Neg Hx    Rectal cancer Neg Hx    Stomach cancer Neg Hx      Review of Systems  Constitutional: Negative.  Negative for chills and fever.  HENT: Negative.  Negative for congestion and sore throat.   Respiratory: Negative.  Negative for cough and shortness of breath.   Cardiovascular: Negative.  Negative for chest pain and palpitations.  Gastrointestinal:   Negative for abdominal pain, diarrhea, nausea and vomiting.  Genitourinary: Negative.  Negative for dysuria and hematuria.  Skin: Negative.  Negative for rash.  Neurological: Negative.  Negative for dizziness and headaches.  All other systems reviewed and are negative.   Vitals:   03/17/24 1501  BP: 110/70  Pulse: 70  Temp: (!) 96.9 F (36.1 C)  SpO2: 97%    Physical Exam Vitals reviewed.  Constitutional:      Appearance: Normal appearance. She is obese.  HENT:     Head: Normocephalic.     Mouth/Throat:     Mouth: Mucous membranes are moist.     Pharynx: Oropharynx is clear.  Eyes:     Extraocular Movements: Extraocular movements intact.     Pupils: Pupils are equal, round, and reactive to light.  Cardiovascular:     Rate and Rhythm: Normal rate and regular rhythm.     Pulses: Normal pulses.     Heart sounds: Normal heart sounds.  Pulmonary:     Effort: Pulmonary effort is normal.     Breath sounds: Normal breath sounds.  Musculoskeletal:     Cervical back: No tenderness.  Lymphadenopathy:     Cervical: No cervical adenopathy.  Skin:    General: Skin is warm and dry.     Capillary Refill: Capillary refill takes less than 2 seconds.  Neurological:     General: No focal deficit present.  Mental Status: She is alert and oriented to person, place, and time.  Psychiatric:        Mood and Affect: Mood normal.        Behavior: Behavior normal.      ASSESSMENT & PLAN: A total of 42 minutes was spent with the patient and counseling/coordination of care regarding preparing for this visit, review of most recent office visit notes, review of most recent hospital discharge summary, review of multiple chronic medical conditions and their management, review of all medications, review of most recent bloodwork results, review of health maintenance items, education on nutrition, prognosis, documentation, and need for follow up.   Problem List Items Addressed This Visit        Cardiovascular and Mediastinum   Hypertension associated with diabetes (HCC) - Primary   BP Readings from Last 3 Encounters:  03/17/24 110/70  03/15/24 (!) 120/92  03/13/24 (!) 148/83  Well-controlled hypertension Continue amlodipine  5 mg daily and valsartan  HCT 160-12.5 mg daily Lab Results  Component Value Date   HGBA1C 10.1 (H) 03/13/2024  Recently started on the following: Insulin  glargine 15 units twice a day Metformin 1000 mg daily Januvia 100 mg daily Cardiovascular risks associated with hypertension and diabetes discussed Diet and nutrition discussed.  Will refer to diabetic nutritional services and education Follow-up in 1 week          Respiratory   OSA on CPAP   Stable.  Continue CPAP treatment        Digestive   GERD (gastroesophageal reflux disease)   Stable and well-controlled        Endocrine   Diabetes mellitus, new onset (HCC)   On presentation to hospital: Initial hospitalist evaluation as follows: Patient with new diagnosis of diabetes mellitus, likely type II, came with significant hyperglycemia and A1c of 10.1. Patient had mild anion gap and ketonuria with mildly elevated betahydroxybutyrate on admission, improved with IV fluid and bolus dose of NovoLog .  Did not required Endo tool. - Consult to diabetes coordinator -Starting on Semglee  10 units twice daily -Moderate SSI -Continue to monitor      Relevant Orders   Ambulatory referral to diabetic education     Musculoskeletal and Integument   Rheumatoid arthritis [facility])   Patient has an history of rheumatoid arthritis, followed up with rheumatology as outpatient. Currently started on Plaquenil but she has not started yet due to concern of GI side effects. Rheumatology follow-up        Other   Morbid obesity (HCC)   Diet and nutrition discussed Benefits of exercise discussed Advised to decrease amount of daily carbohydrate intake and daily calories and increase amount of plant based  protein in her diet      Other Visit Diagnoses       Hospital discharge follow-up          Patient Instructions  Diabetes: Carbohydrate Counting for Adults Carbohydrate counting is a method of keeping track of how many carbohydrates you eat. Eating carbohydrates increases the amount of sugar, also called glucose, in your blood. By counting how many carbohydrates you eat, you can improve how well you manage your blood sugar. This, in turn, helps you manage your diabetes. Carbohydrates are measured in grams (g) per serving. It's important to know how many carbohydrates (in grams or by serving size) you can have in each meal. This is different for every person. A dietitian can help you make a meal plan and calculate how many carbohydrates you should have  at each meal and snack. What foods contain carbohydrates? Carbohydrates are found in these foods: Grains, such as breads and cereals. Dried beans and soy products. Starchy vegetables, such as potatoes, peas, and corn. Fruit and fruit juices. Milk and yogurt. Sweets and snack foods like cake, cookies, candy, chips, and soft drinks. How do I count carbohydrates in foods? There are two ways to count carbohydrates in food. You can read food labels or learn standard serving sizes of foods. You can use either of these methods or a combination of both. Using the Nutrition Facts label The Nutrition Facts list is included on the labels of almost all packaged foods and drinks in the [place] It includes: The serving size. Information about nutrients in each serving. This includes the grams of carbohydrate per serving. To use the Nutrition Facts, decide how many servings you will have. Then, multiply the number of servings by the number of carbohydrates per serving. The resulting number is the total grams of carbohydrates that you'll be having. Learning the standard serving sizes of foods When you eat carbohydrate foods that aren't packaged or don't  include Nutrition Facts on the label, you need to measure the servings in order to count the grams of carbohydrates. Measure the foods that you'll eat with a food scale or measuring cup, if needed. Decide how many standard-size servings you'll eat. Multiply the number of servings by 15. For foods that contain carbohydrates, one serving equals 15 g of carbohydrates. For example, if you eat 2 cups or 10 oz (300 g) of strawberries, you'll have eaten 2 servings and 30 g of carbohydrates (2 servings x 15 g = 30 g). For foods that have more than one food mixed, such as soups and casseroles, you must count the carbohydrates in each food that's included. Here's a list of standard serving sizes for common carbohydrate-rich foods. Each of these servings has about 15 g of carbohydrates: 1 slice of bread. 1 six-inch (15 cm) tortilla. ? cup or 2 oz (53 g) of cooked rice or pasta.  cup or 3 oz (85 g) of cooked or canned, drained, and rinsed beans or lentils.  cup or 3 oz (85 g) of a starchy vegetable, such as peas, corn, or squash.  cup or 4 oz (120 g) of hot cereal.  cup or 3 oz (85 g) of boiled or mashed potatoes, or  or 3 oz (85 g) of a large baked potato.  cup or 4 fl oz (118 mL) of fruit juice. 1 cup or 8 fl oz (237 mL) of milk. 1 small or 4 oz (106 g) apple.  or 2 oz (63 g) of a medium banana. 1 cup or 5 oz (150 g) of strawberries. 3 cups or 1 oz (28.3 g) of popped popcorn. What is an example of carbohydrate counting? To calculate the grams of carbohydrates in this sample meal, follow the steps below. Sample meal 3 oz (85 g) chicken breast. ? cup or 4 oz (106 g) of Haug rice.  cup or 3 oz (85 g) of corn. 1 cup or 8 fl oz (237 mL) of milk. 1 cup or 5 oz (150 g) of strawberries with sugar-free whipped topping. Carbohydrate calculation Identify the foods that have carbohydrates: Rice. Corn. Milk. Strawberries. Calculate how many servings you have of each food: 2 servings of rice. 1  serving of corn. 1 serving of milk. 1 serving of strawberries. Multiply each number of servings by 15 g: 2 servings of rice x 15  g = 30 g. 1 serving of corn x 15 g = 15 g. 1 serving of milk x 15 g = 15 g. 1 serving of strawberries x 15 g = 15 g. Add together all of the amounts to find the total grams of carbohydrates eaten: 30 g + 15 g + 15 g + 15 g = 75 g of carbohydrates total. Where to find more information To learn more, go to: American Diabetes Association at diabetes.org. Click Search and type carb counting. Find the link you need. Centers for Disease Control and Prevention at TonerPromos.no. Click Search and type diabetes. Find the link you need. Academy of Nutrition and Dietetics: eatright.org This information is not intended to replace advice given to you by your health care provider. Make sure you discuss any questions you have with your health care provider. Document Revised: 05/23/2023 Document Reviewed: 05/23/2023 Elsevier Patient Education  2025 Elsevier Inc.    Emil Schaumann, MD [facility]

## 2024-03-17 NOTE — Assessment & Plan Note (Signed)
 Stable and well controlled.

## 2024-03-17 NOTE — Assessment & Plan Note (Signed)
 Diet and nutrition discussed Benefits of exercise discussed Advised to decrease amount of daily carbohydrate intake and daily calories and increase amount of plant based protein in her diet.

## 2024-03-17 NOTE — Assessment & Plan Note (Signed)
 Patient has an history of rheumatoid arthritis, followed up with rheumatology as outpatient. Currently started on Plaquenil but she has not started yet due to concern of GI side effects. Rheumatology follow-up

## 2024-03-17 NOTE — Assessment & Plan Note (Signed)
 BP Readings from Last 3 Encounters:  03/17/24 110/70  03/15/24 (!) 120/92  03/13/24 (!) 148/83  Well-controlled hypertension Continue amlodipine  5 mg daily and valsartan  HCT 160-12.5 mg daily Lab Results  Component Value Date   HGBA1C 10.1 (H) 03/13/2024  Recently started on the following: Insulin  glargine 15 units twice a day Metformin 1000 mg daily Januvia 100 mg daily Cardiovascular risks associated with hypertension and diabetes discussed Diet and nutrition discussed.  Will refer to diabetic nutritional services and education Follow-up in 1 week

## 2024-03-17 NOTE — Assessment & Plan Note (Signed)
Stable.  Continue CPAP treatment.

## 2024-03-17 NOTE — Assessment & Plan Note (Signed)
 On presentation to hospital: Initial hospitalist evaluation as follows: Patient with new diagnosis of diabetes mellitus, likely type II, came with significant hyperglycemia and A1c of 10.1. Patient had mild anion gap and ketonuria with mildly elevated betahydroxybutyrate on admission, improved with IV fluid and bolus dose of NovoLog .  Did not required Endo tool. - Consult to diabetes coordinator -Starting on Semglee  10 units twice daily -Moderate SSI -Continue to monitor

## 2024-03-17 NOTE — Patient Instructions (Signed)

## 2024-03-18 NOTE — ED Provider Notes (Signed)
 [facility]   [id number] 03/13/24 Arrival Time: 1826  ASSESSMENT & PLAN:  1. Hyperglycemia   2. Urinary frequency   3. New onset type 2 diabetes mellitus (HCC)    Labs Reviewed  POCT URINALYSIS DIP (MANUAL ENTRY) - Abnormal; Notable for the following components:      Result Value   Clarity, UA cloudy (*)    Glucose, UA =500 (*)    Ketones, POC UA small (15) (*)    Blood, UA trace-intact (*)    Protein Ur, POC trace (*)    All other components within normal limits  POCT FASTING CBG KUC MANUAL ENTRY - Abnormal; Notable for the following components:   POCT Glucose (KUC) 508 (*)    All other components within normal limits   To ED for further eval/tx; by POV; stable upon discharge.    Discharge Instructions      Your blood sugar was over 500 today in clinic.  Please head to the nearest emergency department for further advanced evaluation.  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      Follow-up Information     Go to  [facility] Emergency Department at [facility].   Specialty: Emergency Medicine Contact information: [address] [place]  [address] [phone]                Reviewed expectations re: course of current medical issues. Questions answered. Outlined signs and symptoms indicating need for more acute intervention. Understanding verbalized. After Visit Summary given.   SUBJECTIVE: History from: Patient. Laurie Hodge is a 63 y.o. female. Frequent urination, fatigue, and dry mouth started last Friday.  Initially thought to be a uti by the patient.  Patient saw pcp and told urine negative.    Patient reports no other testing was done to determine a diagnosis for patient Denies: difficulty breathing. Normal PO intake. Mild nausea at times.  OBJECTIVE:  Vitals:   03/13/24 1934  BP: (!) 148/83   Pulse: 63  Resp: 20  Temp: 98 F (36.7 C)  TempSrc: Oral  SpO2: 97%    General appearance: alert; no distress Eyes: PERRLA; EOMI; conjunctiva normal HENT: ; AT; oropharynx dry Neck: supple  Lungs: speaks full sentences without difficulty; unlabored Extremities: no edema Skin: warm and dry Neurologic: normal gait Psychological: alert and cooperative; normal mood and affect  Labs: Results for orders placed or performed during the hospital encounter of 03/13/24  POC CBG monitoring   Collection Time: 03/13/24  7:54 PM  Result Value Ref Range   POCT Glucose (KUC) 508 (A) 70 - 99 mg/dL  POC urinalysis dipstick   Collection Time: 03/13/24  8:04 PM  Result Value Ref Range   Color, UA yellow yellow   Clarity, UA cloudy (A) clear   Glucose, UA =500 (A) negative mg/dL   Bilirubin, UA negative negative   Ketones, POC UA small (15) (A) negative mg/dL   Spec Grav, UA 8.989 8.989 - 1.025   Blood, UA trace-intact (A) negative   pH, UA 5.5 5.0 - 8.0   Protein Ur, POC trace (A) negative mg/dL   Urobilinogen, UA 0.2 0.2 or 1.0 E.U./dL   Nitrite, UA Negative Negative   Leukocytes, UA Negative Negative   Labs Reviewed  POCT URINALYSIS DIP (MANUAL ENTRY) - Abnormal; Notable for the following components:      Result Value  Clarity, UA cloudy (*)    Glucose, UA =500 (*)    Ketones, POC UA small (15) (*)    Blood, UA trace-intact (*)    Protein Ur, POC trace (*)    All other components within normal limits  POCT FASTING CBG KUC MANUAL ENTRY - Abnormal; Notable for the following components:   POCT Glucose (KUC) 508 (*)    All other components within normal limits    Imaging: No results found.  Allergies  Allergen Reactions   Adhesive [Tape] Rash    Skin Peels    Past Medical History:  Diagnosis Date   Allergy    Anemia    Arthritis    RA   Depression    External hemorrhoids    GERD (gastroesophageal reflux disease)    H. pylori infection    many years ago    Hemorrhoid    Hiatal hernia    HSV-2 infection    IgG   HTN (hypertension)    Hyperlipidemia    Hyperplastic colon polyp    Iron  (Fe) deficiency anemia    Obesity, Class III, BMI 40-49.9 (morbid obesity)    OSA on CPAP    cpap broke this week   Rheumatoid arthritis (HCC)    Sleep apnea    do not use her cipap   Ulcer    gastric - h. pylori positive   Vitamin D  insufficiency    Social History   Socioeconomic History   Marital status: Divorced    Spouse name: n/a   Number of children: 3   Years of education: 12   Highest education level: 12th grade  Occupational History   Occupation: TEACHER'S ASSISTANT    Employer: [employer]    Employer: [employer]  Tobacco Use   Smoking status: Never   Smokeless tobacco: Never  Vaping Use   Vaping status: Never Used  Substance and Sexual Activity   Alcohol use: Yes    Alcohol/week: 0.0 standard drinks of alcohol    Comment: 2-3 times a year at special events   Drug use: No   Sexual activity: Not Currently    Partners: Male    Comment: not active since divorce from husband 2010  Other Topics Concern   Not on file  Social History Narrative   Lives alone     Caffeine: hot chocolate a couple times a week   Social Drivers of Corporate investment banker Strain: Low Risk  (03/16/2024)   Overall Financial Resource Strain (CARDIA)    Difficulty of Paying Living Expenses: Not very hard  Food Insecurity: No Food Insecurity (03/16/2024)   Hunger Vital Sign    Worried About Running Out of Food in the Last Year: Never true    Ran Out of Food in the Last Year: Never true  Transportation Needs: No Transportation Needs (03/16/2024)   PRAPARE - Administrator, Civil Service (Medical): No    Lack of Transportation (Non-Medical): No  Physical Activity: Inactive (03/16/2024)   Exercise Vital Sign    Days of Exercise per Week: 0 days    Minutes of Exercise per Session: Not on file  Stress: No Stress  Concern Present (03/16/2024)   [employer] of Occupational Health - Occupational Stress Questionnaire    Feeling of Stress: Only a little  Social Connections: Moderately Integrated (03/16/2024)   Social Connection and Isolation Panel    Frequency of Communication with Friends and Family: Three times a week  Frequency of Social Gatherings with Friends and Family: Twice a week    Attends Religious Services: More than 4 times per year    Active Member of Clubs or Organizations: Yes    Attends Engineer, structural: More than 4 times per year    Marital Status: Divorced  Intimate Partner Violence: Not At Risk (03/14/2024)   Humiliation, Afraid, Rape, and Kick questionnaire    Fear of Current or Ex-Partner: No    Emotionally Abused: No    Physically Abused: No    Sexually Abused: No   Family History  Problem Relation Age of Onset   Diabetes Mother    Hypertension Mother    Dementia Mother    Stroke Mother 77   Hyperlipidemia Mother    Depression Mother    Anxiety disorder Mother    Obesity Mother    Heart disease Father    Hyperlipidemia Father    Hypertension Father    Stroke Father    Depression Father    Sleep apnea Father    Obesity Father    Diabetes Sister    Heart disease Sister    Hyperlipidemia Sister    Hypertension Sister    Heart disease Sister    Hyperlipidemia Sister    Hypertension Sister    Diabetes Brother    Hyperlipidemia Brother    Hypertension Brother    Hypertension Son    Cancer Paternal Aunt        breast ca x 5 maternal aunts   Colon cancer Neg Hx    Esophageal cancer Neg Hx    Rectal cancer Neg Hx    Stomach cancer Neg Hx    Past Surgical History:  Procedure Laterality Date   LAPAROSCOPIC GASTRIC SLEEVE RESECTION N/A 05/31/2015   Procedure: LAPAROSCOPIC GASTRIC SLEEVE RESECTION;  Surgeon: Herlene Beverley Bureau, MD;  Location: [facility];  Service: General;  Laterality: N/A;   TUBAL LINNA Rolinda Rogue, MD 03/18/24  [phone]

## 2024-04-10 ENCOUNTER — Other Ambulatory Visit (HOSPITAL_COMMUNITY): Payer: Self-pay

## 2024-04-21 ENCOUNTER — Encounter: Payer: Self-pay | Admitting: Emergency Medicine

## 2024-04-21 MED ORDER — LANCET DEVICE MISC: 1.0000 | 0 refills | Status: AC

## 2024-04-21 MED ORDER — BLOOD GLUCOSE TEST VI STRP: 1.0000 | ORAL_STRIP | 0 refills | Status: AC

## 2024-04-21 MED ORDER — BLOOD GLUCOSE MONITORING SUPPL DEVI: 1.0000 | 0 refills | Status: AC

## 2024-04-21 MED ORDER — LANCETS MISC: 1.0000 | 0 refills | Status: AC

## 2024-04-21 NOTE — Addendum Note (Signed)
 Addended by: ROSALVA LEX RAMAN on: 04/21/2024 11:16 AM   Modules accepted: Orders

## 2024-04-24 ENCOUNTER — Encounter: Attending: Emergency Medicine | Admitting: Skilled Nursing Facility1

## 2024-04-24 VITALS — Wt 256.0 lb

## 2024-04-24 DIAGNOSIS — E119 Type 2 diabetes mellitus without complications: Secondary | ICD-10-CM | POA: Diagnosis present

## 2024-04-28 ENCOUNTER — Encounter: Payer: Self-pay | Admitting: Skilled Nursing Facility1

## 2024-04-28 NOTE — Progress Notes (Signed)
 Class start Time: 4:12   Class End Time: 5:36   This was a class of 4 patients.      Patient was seen on 04/24/2024 for the first of a series of three diabetes self-management courses at the [facility].  Patient Education Plan per assessed needs and concerns is to attend three course education program for Diabetes Self Management Education.  A1C was 10   The following learning objectives were met by the patient during this class: Describe diabetes, types of diabetes and pathophysiology State some common risk factors for diabetes Defines the role of glucose and insulin  Describe the relationship between diabetes and cardiovascular and other risks State the members of the Healthcare Team States the rationale for glucose monitoring and when to test State their individual Target Range State the importance of logging glucose readings and how to interpret the readings Identifies A1C target Explain the correlation between A1c and eAG values State symptoms and treatment of high blood glucose and low blood glucose Explain proper technique for glucose testing and identify proper sharps disposal  Handouts given during class include: How to Thrive:  A Guide for Your Journey with Diabetes by the ADA Meal Plan Card and carbohydrate content list Dietary intake form Low Sodium Flavoring Tips Types of Fats Dining Out Label reading Snack list The diabetes portion plate Diabetes Resources A1c to eAG Conversion Chart Blood Glucose Log Diabetes Recommended Care Schedule Support Group Diabetes Success Plan Core Class Satisfaction Survey   Follow-Up Plan: Attend core 2

## 2024-05-01 ENCOUNTER — Encounter: Attending: Emergency Medicine | Admitting: Skilled Nursing Facility1

## 2024-05-01 DIAGNOSIS — E119 Type 2 diabetes mellitus without complications: Secondary | ICD-10-CM | POA: Insufficient documentation

## 2024-05-01 NOTE — Progress Notes (Signed)
 Appt time:  4:25  End Time: 5:55pm  Pt arrived for class with 3 participants.   Patient was seen on 05/01/2024 for the second of a series of three diabetes self-management courses at the [facility]. The following learning objectives were met by the patient during this class:  Describe the role of different macronutrients on glucose Explain how carbohydrates affect blood glucose State what foods contain the most carbohydrates Demonstrate carbohydrate counting Demonstrate how to read Nutrition Facts food label Describe effects of various fats on heart health Describe the importance of good nutrition for health and healthy eating strategies Describe techniques for managing your shopping, cooking and meal planning List strategies to follow meal plan when dining out Describe the effects of alcohol on glucose and how to use it safely  Goals:  Follow Diabetes Meal Plan as instructed  Eat 3 meals and 2 snacks, every 3-5 hrs  Limit carbohydrate intake to ~45 grams carbohydrate/meal Limit carbohydrate intake to 15 grams carbohydrate/snack Add lean protein foods to meals/snacks  Monitor glucose levels as instructed by your doctor  Patients goal: eat more color  Follow-Up Plan: Attend Core 3 Work towards following your personal food plan.

## 2024-05-05 ENCOUNTER — Encounter: Payer: Self-pay | Admitting: Emergency Medicine

## 2024-05-06 NOTE — Telephone Encounter (Signed)
 Stop second insulin  dose.  Is she seeing an endocrinologist?  If she is not she should.  Place referral as needed please.

## 2024-05-06 NOTE — Telephone Encounter (Signed)
 Please advise

## 2024-05-06 NOTE — Telephone Encounter (Signed)
 I have placed a referral to endo per provider request

## 2024-05-06 NOTE — Addendum Note (Signed)
 Addended by: ROSALVA LEX RAMAN on: 05/06/2024 10:23 AM   Modules accepted: Orders

## 2024-05-08 ENCOUNTER — Ambulatory Visit: Admitting: Dietician

## 2024-05-10 ENCOUNTER — Other Ambulatory Visit: Payer: Self-pay | Admitting: Emergency Medicine

## 2024-05-10 DIAGNOSIS — I10 Essential (primary) hypertension: Secondary | ICD-10-CM

## 2024-05-29 ENCOUNTER — Other Ambulatory Visit: Payer: Self-pay | Admitting: Emergency Medicine

## 2024-05-29 DIAGNOSIS — E785 Hyperlipidemia, unspecified: Secondary | ICD-10-CM

## 2024-06-16 ENCOUNTER — Ambulatory Visit: Admitting: Emergency Medicine

## 2024-06-16 ENCOUNTER — Encounter: Payer: Self-pay | Admitting: Emergency Medicine

## 2024-06-16 VITALS — BP 130/70 | HR 60 | Temp 98.0°F | Ht 65.0 in | Wt 254.0 lb

## 2024-06-16 DIAGNOSIS — M059 Rheumatoid arthritis with rheumatoid factor, unspecified: Secondary | ICD-10-CM

## 2024-06-16 DIAGNOSIS — G4733 Obstructive sleep apnea (adult) (pediatric): Secondary | ICD-10-CM

## 2024-06-16 DIAGNOSIS — I152 Hypertension secondary to endocrine disorders: Secondary | ICD-10-CM | POA: Diagnosis not present

## 2024-06-16 DIAGNOSIS — Z7984 Long term (current) use of oral hypoglycemic drugs: Secondary | ICD-10-CM

## 2024-06-16 DIAGNOSIS — E1159 Type 2 diabetes mellitus with other circulatory complications: Secondary | ICD-10-CM | POA: Diagnosis not present

## 2024-06-16 DIAGNOSIS — K219 Gastro-esophageal reflux disease without esophagitis: Secondary | ICD-10-CM

## 2024-06-16 DIAGNOSIS — E785 Hyperlipidemia, unspecified: Secondary | ICD-10-CM | POA: Diagnosis not present

## 2024-06-16 DIAGNOSIS — E1169 Type 2 diabetes mellitus with other specified complication: Secondary | ICD-10-CM | POA: Insufficient documentation

## 2024-06-16 LAB — POCT GLYCOSYLATED HEMOGLOBIN (HGB A1C): HbA1c POC (<> result, manual entry): 6.1 %

## 2024-06-16 NOTE — Progress Notes (Signed)
 Laurie Hodge 63 y.o.   Chief Complaint  Patient presents with   Follow-up    HISTORY OF PRESENT ILLNESS: This is a 63 y.o. female here for 40-month follow-up of diabetes and hypertension Feeling better.  Compliant with medications. Has no complaints or medical concerns today. Wt Readings from Last 3 Encounters:  06/16/24 254 lb (115.2 kg)  04/28/24 256 lb (116.1 kg)  03/17/24 255 lb 12.8 oz (116 kg)     HPI   Prior to Admission medications  Medication Sig Start Date End Date Taking? Authorizing Provider  amLODipine  (NORVASC ) 5 MG tablet Take 1 tablet by mouth once daily 05/11/24  Yes Shyne Lehrke Jose, MD  atorvastatin  (LIPITOR) 40 MG tablet Take 1 tablet by mouth once daily 05/29/24  Yes Maudell Stanbrough, Emil Schanz, MD  Biotin 5 MG CAPS Take 15 mg by mouth daily.   Yes [provider]  Blood Glucose Monitoring Suppl (BLOOD GLUCOSE MONITOR SYSTEM) w/Device KIT 1 each by Does not apply route in the morning, at noon, and at bedtime. May substitute to any manufacturer covered by patient's insurance. 03/15/24  Yes Caleen Qualia, MD  Blood Glucose Monitoring Suppl DEVI 1 each by Does not apply route as directed. Dispense based on patient and insurance preference. Use up to four times daily as directed. (FOR ICD-10 E10.9, E11.9). 04/21/24  Yes Teyonna Plaisted Jose, MD  Cholecalciferol (VITAMIN D ) 50 MCG (2000 UT) tablet Pt will increase to 4000 one day and 6,000 the next. (  change to 5,000 IU daily) 05/30/23  Yes Opalski, Barnie, DO  Continuous Glucose Receiver (FREESTYLE LIBRE 3 READER) DEVI Use to check blood sugars continuously, up to 3x daily 03/17/24  Yes Tallia Moehring, Emil Schanz, MD  Continuous Glucose Sensor (FREESTYLE LIBRE 3 SENSOR) MISC Place 1 sensor on the skin every 14 days. Use to check glucose continuously, up to 3x daily. 03/17/24  Yes Elektra Wartman, Emil Schanz, MD  ENBREL SURECLICK 50 MG/ML injection Inject 50 mg into the skin once a week. 06/22/23  Yes [provider]  FERREX 150 150 MG capsule Take 1 capsule by mouth twice daily 02/21/24  Yes Rhilynn Preyer, [place], MD  Fish Oil-Krill Oil (KRILL OIL PLUS) CAPS Take 750 mg by mouth daily.   Yes [provider]  Glucose Blood (BLOOD GLUCOSE TEST STRIPS) STRP 1 each by Does not apply route as directed. Dispense based on patient and insurance preference. Use up to four times daily as directed. (FOR ICD-10 E10.9, E11.9). 04/21/24  Yes Kayl Stogdill, Emil Schanz, MD  insulin  glargine (LANTUS ) 100 UNIT/ML Solostar Pen Inject 15 Units into the skin 2 (two) times daily. 03/15/24  Yes Caleen Qualia, MD  Insulin  Pen Needle (PEN NEEDLES) 32G X 4 MM MISC To use with your insulin  pen 03/15/24  Yes Caleen Qualia, MD  ketoconazole  (NIZORAL ) 2 % cream Apply 1 Application topically daily. Patient taking differently: Apply 1 Application topically daily as needed. 08/01/23  Yes SagardiaEmil Schanz, MD  Lancet Device MISC 1 each by Does not apply route as directed. Dispense based on patient and insurance preference. Use up to four times daily as directed. (FOR ICD-10 E10.9, E11.9). 04/21/24  Yes SagardiaEmil Schanz, MD  Lancets MISC 1 each by Does not apply route as directed. Dispense based on patient and insurance preference. Use up to four times daily as directed. (FOR ICD-10 E10.9, E11.9). 04/21/24  Yes Glenn Gullickson, Emil Schanz, MD  meloxicam  (MOBIC ) 15 MG tablet Take 15 mg by mouth daily as needed.  Yes [provider]  metFORMIN  (GLUCOPHAGE -XR) 500 MG 24 hr tablet Take 2 tablets (1,000 mg total) by mouth daily with breakfast. 03/15/24  Yes Caleen Qualia, MD  sitaGLIPtin  (JANUVIA ) 100 MG tablet Take 1 tablet (100 mg total) by mouth daily. 03/15/24  Yes Amin, Sumayya, MD  valsartan -hydrochlorothiazide  (DIOVAN -HCT) 160-12.5 MG tablet Take 1 tablet by mouth once daily 05/11/24  Yes Laurin Morgenstern Jose, MD  hydroxychloroquine (PLAQUENIL) 200 MG tablet Take 200 mg by mouth 2 (two) times daily. NOT TAKING  THIS Patient not taking: Reported on 06/16/2024 02/27/24   [provider]    Allergies[1]  Patient Active Problem List   Diagnosis Date Noted   Diabetes mellitus, new onset (HCC) 03/14/2024   Dysuria 03/11/2024   Other fatigue 08/01/2023   Morbid obesity (HCC) 12/19/2022   Obesity, Class III, BMI 40-49.9 (morbid obesity) (HCC) 01/07/2020   Osteoarthritis 02/05/2015   Hyperlipidemia 11/06/2013   Rheumatoid arthritis (HCC) 06/10/2013   BMI 36.0-36.9,adult    GERD (gastroesophageal reflux disease)    OSA on CPAP     Class: Minor   Hypertension associated with diabetes (HCC)    Vitamin D  insufficiency    Iron  (Fe) deficiency anemia     Past Medical History:  Diagnosis Date   Allergy    Anemia    Arthritis    RA   Depression    External hemorrhoids    GERD (gastroesophageal reflux disease)    H. pylori infection    many years ago   Hemorrhoid    Hiatal hernia    HSV-2 infection    IgG   HTN (hypertension)    Hyperlipidemia    Hyperplastic colon polyp    Iron  (Fe) deficiency anemia    Obesity, Class III, BMI 40-49.9 (morbid obesity) (HCC)    OSA on CPAP    cpap broke this week   Rheumatoid arthritis (HCC)    Sleep apnea    do not use her cipap   Ulcer    gastric - h. pylori positive   Vitamin D  insufficiency     Past Surgical History:  Procedure Laterality Date   LAPAROSCOPIC GASTRIC SLEEVE RESECTION N/A 05/31/2015   Procedure: LAPAROSCOPIC GASTRIC SLEEVE RESECTION;  Surgeon: Herlene Righter Kinsinger, MD;  Location: [facility];  Service: General;  Laterality: N/A;   TUBAL LIGATION      Social History   Socioeconomic History   Marital status: Divorced    Spouse name: n/a   Number of children: 3   Years of education: 12   Highest education level: 12th grade  Occupational History   Occupation: TEACHER'S ASSISTANT    Employer: [employer]    Employer: [employer]  Tobacco Use   Smoking  status: Never   Smokeless tobacco: Never  Vaping Use   Vaping status: Never Used  Substance and Sexual Activity   Alcohol use: Yes    Alcohol/week: 0.0 standard drinks of alcohol    Comment: 2-3 times a year at special events   Drug use: No   Sexual activity: Not Currently    Partners: Male    Comment: not active since divorce from husband 2010  Other Topics Concern   Not on file  Social History Narrative   Lives alone     Caffeine: hot chocolate a couple times a week   Social Drivers of Health   Tobacco Use: Low Risk (06/16/2024)   Patient History    Smoking Tobacco Use: Never    Smokeless  Tobacco Use: Never    Passive Exposure: Not on file  Financial Resource Strain: Low Risk (06/13/2024)   Overall Financial Resource Strain (CARDIA)    Difficulty of Paying Living Expenses: Not very hard  Food Insecurity: No Food Insecurity (06/13/2024)   Epic    Worried About Programme Researcher, Broadcasting/film/video in the Last Year: Never true    Ran Out of Food in the Last Year: Never true  Transportation Needs: No Transportation Needs (06/13/2024)   Epic    Lack of Transportation (Medical): No    Lack of Transportation (Non-Medical): No  Physical Activity: Inactive (06/13/2024)   Exercise Vital Sign    Days of Exercise per Week: 0 days    Minutes of Exercise per Session: Not on file  Stress: No Stress Concern Present (06/13/2024)   [employer] of Occupational Health - Occupational Stress Questionnaire    Feeling of Stress: Only a little  Social Connections: Moderately Integrated (06/13/2024)   Social Connection and Isolation Panel    Frequency of Communication with Friends and Family: Three times a week    Frequency of Social Gatherings with Friends and Family: Twice a week    Attends Religious Services: More than 4 times per year    Active Member of Clubs or Organizations: Yes    Attends Engineer, Structural: More than 4 times per year    Marital Status:  Divorced  Intimate Partner Violence: Not At Risk (03/14/2024)   Epic    Fear of Current or Ex-Partner: No    Emotionally Abused: No    Physically Abused: No    Sexually Abused: No  Depression (PHQ2-9): Low Risk (06/16/2024)   Depression (PHQ2-9)    PHQ-2 Score: 0  Alcohol Screen: Low Risk (06/13/2024)   Alcohol Screen    Last Alcohol Screening Score (AUDIT): 1  Housing: High Risk (06/13/2024)   Epic    Unable to Pay for Housing in the Last Year: Yes    Number of Times Moved in the Last Year: 1    Homeless in the Last Year: No  Utilities: Not At Risk (03/14/2024)   Epic    Threatened with loss of utilities: No  Health Literacy: Not on file    Family History  Problem Relation Age of Onset   Diabetes Mother    Hypertension Mother    Dementia Mother    Stroke Mother 56   Hyperlipidemia Mother    Depression Mother    Anxiety disorder Mother    Obesity Mother    Heart disease Father    Hyperlipidemia Father    Hypertension Father    Stroke Father    Depression Father    Sleep apnea Father    Obesity Father    Diabetes Sister    Heart disease Sister    Hyperlipidemia Sister    Hypertension Sister    Heart disease Sister    Hyperlipidemia Sister    Hypertension Sister    Diabetes Brother    Hyperlipidemia Brother    Hypertension Brother    Hypertension Son    Cancer Paternal Aunt        breast ca x 5 maternal aunts   Colon cancer Neg Hx    Esophageal cancer Neg Hx    Rectal cancer Neg Hx    Stomach cancer Neg Hx      Review of Systems  Constitutional: Negative.  Negative for chills and fever.  HENT: Negative.  Negative for congestion and sore throat.  Respiratory: Negative.  Negative for cough and shortness of breath.   Cardiovascular: Negative.  Negative for chest pain and palpitations.  Gastrointestinal:  Negative for abdominal pain, diarrhea, nausea and vomiting.  Genitourinary: Negative.  Negative for dysuria and  hematuria.  Skin: Negative.  Negative for rash.  Neurological: Negative.  Negative for dizziness and headaches.  All other systems reviewed and are negative.   Vitals:   06/16/24 1100  BP: (!) 160/80  Pulse: 60  Temp: 98 F (36.7 C)  SpO2: 96%    Physical Exam Vitals reviewed.  Constitutional:      Appearance: Normal appearance. She is obese.  HENT:     Head: Normocephalic.     Mouth/Throat:     Mouth: Mucous membranes are moist.     Pharynx: Oropharynx is clear.  Eyes:     Extraocular Movements: Extraocular movements intact.     Pupils: Pupils are equal, round, and reactive to light.  Cardiovascular:     Rate and Rhythm: Normal rate and regular rhythm.     Pulses: Normal pulses.     Heart sounds: Normal heart sounds.  Pulmonary:     Effort: Pulmonary effort is normal.     Breath sounds: Normal breath sounds.  Musculoskeletal:     Cervical back: No tenderness.  Lymphadenopathy:     Cervical: No cervical adenopathy.  Skin:    General: Skin is warm and dry.     Capillary Refill: Capillary refill takes less than 2 seconds.  Neurological:     General: No focal deficit present.     Mental Status: She is alert and oriented to person, place, and time.  Psychiatric:        Mood and Affect: Mood normal.        Behavior: Behavior normal.     Results for orders placed or performed in visit on 06/16/24 (from the past 24 hours)  POCT HgB A1C     Status: Abnormal   Collection Time: 06/16/24 11:16 AM  Result Value Ref Range   Hemoglobin A1C     HbA1c POC (<> result, manual entry) 6.1 4.0 - 5.6 %   HbA1c, POC (prediabetic range)     HbA1c, POC (controlled diabetic range)      ASSESSMENT & PLAN: A total of 42 minutes was spent with the patient and counseling/coordination of care regarding preparing for this visit, review of most recent office visit notes, review of multiple chronic medical conditions and their management, cardiovascular risks associated with hypertension  and diabetes, review of all medications, review of most recent bloodwork results including interpretation of today's hemoglobin A1c, review of health maintenance items, education on nutrition, prognosis, documentation, and need for follow up.   Problem List Items Addressed This Visit       Cardiovascular and Mediastinum   Hypertension associated with diabetes (HCC) - Primary   BP Readings from Last 3 Encounters:  06/16/24 130/70  03/17/24 110/70  03/15/24 (!) 120/92  Elevated blood pressure reading in the office but normal at home Continue amlodipine  5 mg daily along with valsartan  HCT 160-12.5 mg daily Hemoglobin A1c today is 6.1.  Much improved and at goal. Continue the following: Insulin  glargine 15 units twice a day Metformin  1000 mg daily Januvia  100 mg daily Cardiovascular risks associated with hypertension and diabetes discussed Follow-up in 3 months Diet and nutrition discussed.        Relevant Orders   POCT HgB A1C (Completed)     Respiratory  OSA on CPAP   Stable. Continue CPAP treatment         Digestive   GERD (gastroesophageal reflux disease)   Stable and well-controlled         Endocrine   Dyslipidemia associated with type 2 diabetes mellitus (HCC)   Well-controlled diabetes with hemoglobin A1c of 6.1 Continue insulin  glargine, Januvia , and metformin  as detailed above Continue atorvastatin  40 mg daily Diet and nutrition discussed        Musculoskeletal and Integument   Rheumatoid arthritis (HCC)   Patient has an history of rheumatoid arthritis, followed up with rheumatology as outpatient. Currently started on Plaquenil but she has not started yet due to concern of GI side effects. Rheumatology follow-up        Other   Morbid obesity (HCC)   Diet and nutrition discussed Benefits of exercise discussed Advised to decrease amount of daily carbohydrate intake and daily calories and increase amount of plant based protein in her diet       Patient Instructions  Diabetes: Carbohydrate Counting for Adults Carbohydrate counting is a method of keeping track of how many carbohydrates you eat. Eating carbohydrates increases the amount of sugar, also called glucose, in your blood. By counting how many carbohydrates you eat, you can improve how well you manage your blood sugar. This, in turn, helps you manage your diabetes. Carbohydrates are measured in grams (g) per serving. It's important to know how many carbohydrates (in grams or by serving size) you can have in each meal. This is different for every person. A dietitian can help you make a meal plan and calculate how many carbohydrates you should have at each meal and snack. What foods contain carbohydrates? Carbohydrates are found in these foods: Grains, such as breads and cereals. Dried beans and soy products. Starchy vegetables, such as potatoes, peas, and corn. Fruit and fruit juices. Milk and yogurt. Sweets and snack foods like cake, cookies, candy, chips, and soft drinks. How do I count carbohydrates in foods? There are two ways to count carbohydrates in food. You can read food labels or learn standard serving sizes of foods. You can use either of these methods or a combination of both. Using the Nutrition Facts label The Nutrition Facts list is included on the labels of almost all packaged foods and drinks in the [place] It includes: The serving size. Information about nutrients in each serving. This includes the grams of carbohydrate per serving. To use the Nutrition Facts, decide how many servings you will have. Then, multiply the number of servings by the number of carbohydrates per serving. The resulting number is the total grams of carbohydrates that you'll be having. Learning the standard serving sizes of foods When you eat carbohydrate foods that aren't packaged or don't include Nutrition Facts on the label, you need to measure the servings in order to count the grams of  carbohydrates. Measure the foods that you'll eat with a food scale or measuring cup, if needed. Decide how many standard-size servings you'll eat. Multiply the number of servings by 15. For foods that contain carbohydrates, one serving equals 15 g of carbohydrates. For example, if you eat 2 cups or 10 oz (300 g) of strawberries, you'll have eaten 2 servings and 30 g of carbohydrates (2 servings x 15 g = 30 g). For foods that have more than one food mixed, such as soups and casseroles, you must count the carbohydrates in each food that's included. Here's a list of standard serving sizes  for common carbohydrate-rich foods. Each of these servings has about 15 g of carbohydrates: 1 slice of bread. 1 six-inch (15 cm) tortilla. ? cup or 2 oz (53 g) of cooked rice or pasta.  cup or 3 oz (85 g) of cooked or canned, drained, and rinsed beans or lentils.  cup or 3 oz (85 g) of a starchy vegetable, such as peas, corn, or squash.  cup or 4 oz (120 g) of hot cereal.  cup or 3 oz (85 g) of boiled or mashed potatoes, or  or 3 oz (85 g) of a large baked potato.  cup or 4 fl oz (118 mL) of fruit juice. 1 cup or 8 fl oz (237 mL) of milk. 1 small or 4 oz (106 g) apple.  or 2 oz (63 g) of a medium banana. 1 cup or 5 oz (150 g) of strawberries. 3 cups or 1 oz (28.3 g) of popped popcorn. What is an example of carbohydrate counting? To calculate the grams of carbohydrates in this sample meal, follow the steps below. Sample meal 3 oz (85 g) chicken breast. ? cup or 4 oz (106 g) of [place] rice.  cup or 3 oz (85 g) of corn. 1 cup or 8 fl oz (237 mL) of milk. 1 cup or 5 oz (150 g) of strawberries with sugar-free whipped topping. Carbohydrate calculation Identify the foods that have carbohydrates: Rice. Corn. Milk. Strawberries. Calculate how many servings you have of each food: 2 servings of rice. 1 serving of corn. 1 serving of milk. 1 serving of strawberries. Multiply each number of servings by  15 g: 2 servings of rice x 15 g = 30 g. 1 serving of corn x 15 g = 15 g. 1 serving of milk x 15 g = 15 g. 1 serving of strawberries x 15 g = 15 g. Add together all of the amounts to find the total grams of carbohydrates eaten: 30 g + 15 g + 15 g + 15 g = 75 g of carbohydrates total. Where to find more information To learn more, go to: American Diabetes Association at diabetes.org. Click Search and type carb counting. Find the link you need. Centers for Disease Control and Prevention at tonerpromos.no. Click Search and type diabetes. Find the link you need. Academy of Nutrition and Dietetics: eatright.org This information is not intended to replace advice given to you by your health care provider. Make sure you discuss any questions you have with your health care provider. Document Revised: 05/23/2023 Document Reviewed: 05/23/2023 Elsevier Patient Education  2025 Elsevier Inc.     Emil Schaumann, MD [facility]      [1] Allergies Allergen Reactions   Adhesive [Tape] Rash    Skin Peels

## 2024-06-16 NOTE — Assessment & Plan Note (Signed)
 Diet and nutrition discussed Benefits of exercise discussed Advised to decrease amount of daily carbohydrate intake and daily calories and increase amount of plant based protein in her diet.

## 2024-06-16 NOTE — Assessment & Plan Note (Signed)
Stable.  Continue CPAP treatment.

## 2024-06-16 NOTE — Assessment & Plan Note (Signed)
 Stable and well controlled.

## 2024-06-16 NOTE — Assessment & Plan Note (Signed)
 BP Readings from Last 3 Encounters:  06/16/24 130/70  03/17/24 110/70  03/15/24 (!) 120/92  Elevated blood pressure reading in the office but normal at home Continue amlodipine  5 mg daily along with valsartan  HCT 160-12.5 mg daily Hemoglobin A1c today is 6.1.  Much improved and at goal. Continue the following: Insulin  glargine 15 units twice a day Metformin  1000 mg daily Januvia  100 mg daily Cardiovascular risks associated with hypertension and diabetes discussed Follow-up in 3 months Diet and nutrition discussed.

## 2024-06-16 NOTE — Patient Instructions (Signed)

## 2024-06-16 NOTE — Assessment & Plan Note (Signed)
 Patient has an history of rheumatoid arthritis, followed up with rheumatology as outpatient. Currently started on Plaquenil but she has not started yet due to concern of GI side effects. Rheumatology follow-up

## 2024-06-16 NOTE — Assessment & Plan Note (Signed)
 Well-controlled diabetes with hemoglobin A1c of 6.1 Continue insulin  glargine, Januvia , and metformin  as detailed above Continue atorvastatin  40 mg daily Diet and nutrition discussed

## 2024-07-17 LAB — HM MAMMOGRAPHY

## 2024-07-28 ENCOUNTER — Ambulatory Visit: Admitting: Skilled Nursing Facility1

## 2024-09-16 ENCOUNTER — Ambulatory Visit: Admitting: Emergency Medicine

## 2024-10-28 ENCOUNTER — Ambulatory Visit: Admitting: "Endocrinology

## 2024-11-11 ENCOUNTER — Ambulatory Visit: Admitting: Family Medicine
